# Patient Record
Sex: Female | Born: 1965 | Race: Black or African American | Hispanic: No | Marital: Single | Smoking: Never smoker
Health system: Southern US, Community
[De-identification: ages and names within clinical notes are randomized; demographics above are authoritative.]

## PROBLEM LIST (undated history)

## (undated) DIAGNOSIS — N83209 Unspecified ovarian cyst, unspecified side: Secondary | ICD-10-CM

## (undated) DIAGNOSIS — D259 Leiomyoma of uterus, unspecified: Secondary | ICD-10-CM

## (undated) DIAGNOSIS — I493 Ventricular premature depolarization: Secondary | ICD-10-CM

## (undated) DIAGNOSIS — R609 Edema, unspecified: Secondary | ICD-10-CM

## (undated) DIAGNOSIS — F419 Anxiety disorder, unspecified: Secondary | ICD-10-CM

## (undated) DIAGNOSIS — M2550 Pain in unspecified joint: Secondary | ICD-10-CM

## (undated) DIAGNOSIS — F418 Other specified anxiety disorders: Secondary | ICD-10-CM

## (undated) DIAGNOSIS — K635 Polyp of colon: Secondary | ICD-10-CM

## (undated) DIAGNOSIS — R011 Cardiac murmur, unspecified: Secondary | ICD-10-CM

## (undated) DIAGNOSIS — E039 Hypothyroidism, unspecified: Secondary | ICD-10-CM

## (undated) DIAGNOSIS — E559 Vitamin D deficiency, unspecified: Secondary | ICD-10-CM

## (undated) DIAGNOSIS — N6009 Solitary cyst of unspecified breast: Secondary | ICD-10-CM

## (undated) DIAGNOSIS — E049 Nontoxic goiter, unspecified: Secondary | ICD-10-CM

## (undated) DIAGNOSIS — E538 Deficiency of other specified B group vitamins: Secondary | ICD-10-CM

## (undated) DIAGNOSIS — Z8719 Personal history of other diseases of the digestive system: Secondary | ICD-10-CM

## (undated) DIAGNOSIS — R112 Nausea with vomiting, unspecified: Secondary | ICD-10-CM

## (undated) DIAGNOSIS — B009 Herpesviral infection, unspecified: Secondary | ICD-10-CM

## (undated) DIAGNOSIS — Z780 Asymptomatic menopausal state: Secondary | ICD-10-CM

## (undated) DIAGNOSIS — M1990 Unspecified osteoarthritis, unspecified site: Secondary | ICD-10-CM

## (undated) DIAGNOSIS — J189 Pneumonia, unspecified organism: Secondary | ICD-10-CM

## (undated) DIAGNOSIS — E785 Hyperlipidemia, unspecified: Secondary | ICD-10-CM

## (undated) DIAGNOSIS — Z9889 Other specified postprocedural states: Secondary | ICD-10-CM

## (undated) DIAGNOSIS — I491 Atrial premature depolarization: Secondary | ICD-10-CM

## (undated) HISTORY — DX: Deficiency of other specified B group vitamins: E53.8

## (undated) HISTORY — DX: Hyperlipidemia, unspecified: E78.5

## (undated) HISTORY — DX: Pain in unspecified joint: M25.50

## (undated) HISTORY — DX: Vitamin D deficiency, unspecified: E55.9

## (undated) HISTORY — DX: Asymptomatic menopausal state: Z78.0

## (undated) HISTORY — PX: COLONOSCOPY: SHX174

## (undated) HISTORY — DX: Cardiac murmur, unspecified: R01.1

## (undated) HISTORY — DX: Edema, unspecified: R60.9

## (undated) HISTORY — PX: COLON SURGERY: SHX602

## (undated) HISTORY — PX: POLYPECTOMY: SHX149

## (undated) HISTORY — DX: Unspecified ovarian cyst, unspecified side: N83.209

## (undated) HISTORY — DX: Solitary cyst of unspecified breast: N60.09

## (undated) HISTORY — DX: Nontoxic goiter, unspecified: E04.9

## (undated) HISTORY — DX: Leiomyoma of uterus, unspecified: D25.9

## (undated) HISTORY — DX: Anxiety disorder, unspecified: F41.9

## (undated) HISTORY — DX: Herpesviral infection, unspecified: B00.9

## (undated) HISTORY — DX: Unspecified osteoarthritis, unspecified site: M19.90

---

## 1993-03-26 HISTORY — PX: LIPOSUCTION: SHX10

## 1998-12-01 ENCOUNTER — Other Ambulatory Visit: Admission: RE | Admit: 1998-12-01 | Discharge: 1998-12-01 | Payer: Self-pay | Admitting: Obstetrics and Gynecology

## 2000-11-05 ENCOUNTER — Encounter: Admission: RE | Admit: 2000-11-05 | Discharge: 2000-12-31 | Payer: Self-pay | Admitting: Internal Medicine

## 2001-01-17 ENCOUNTER — Other Ambulatory Visit: Admission: RE | Admit: 2001-01-17 | Discharge: 2001-01-17 | Payer: Self-pay | Admitting: Obstetrics and Gynecology

## 2002-02-11 ENCOUNTER — Other Ambulatory Visit: Admission: RE | Admit: 2002-02-11 | Discharge: 2002-02-11 | Payer: Self-pay | Admitting: Obstetrics and Gynecology

## 2003-04-16 ENCOUNTER — Other Ambulatory Visit: Admission: RE | Admit: 2003-04-16 | Discharge: 2003-04-16 | Payer: Self-pay | Admitting: Obstetrics and Gynecology

## 2004-06-14 ENCOUNTER — Other Ambulatory Visit: Admission: RE | Admit: 2004-06-14 | Discharge: 2004-06-14 | Payer: Self-pay | Admitting: Obstetrics and Gynecology

## 2005-07-10 ENCOUNTER — Other Ambulatory Visit: Admission: RE | Admit: 2005-07-10 | Discharge: 2005-07-10 | Payer: Self-pay | Admitting: Obstetrics and Gynecology

## 2006-08-08 DIAGNOSIS — F411 Generalized anxiety disorder: Secondary | ICD-10-CM

## 2006-08-08 DIAGNOSIS — J309 Allergic rhinitis, unspecified: Secondary | ICD-10-CM | POA: Insufficient documentation

## 2006-08-09 ENCOUNTER — Ambulatory Visit: Payer: Self-pay | Admitting: Family Medicine

## 2006-08-09 LAB — CONVERTED CEMR LAB
ALT: 12 units/L (ref 0–40)
AST: 16 units/L (ref 0–37)
Alkaline Phosphatase: 70 units/L (ref 39–117)
BUN: 8 mg/dL (ref 6–23)
Basophils Relative: 0.1 % (ref 0.0–1.0)
Bilirubin, Direct: 0.1 mg/dL (ref 0.0–0.3)
Cholesterol: 176 mg/dL (ref 0–200)
Eosinophils Absolute: 0.2 10*3/uL (ref 0.0–0.6)
GFR calc non Af Amer: 65 mL/min
Glucose, Bld: 89 mg/dL (ref 70–99)
Lymphocytes Relative: 26.8 % (ref 12.0–46.0)
MCHC: 34.3 g/dL (ref 30.0–36.0)
Monocytes Absolute: 0.4 10*3/uL (ref 0.2–0.7)
Monocytes Relative: 4.1 % (ref 3.0–11.0)
Neutrophils Relative %: 66.5 % (ref 43.0–77.0)
Potassium: 4.1 meq/L (ref 3.5–5.1)
RDW: 11.7 % (ref 11.5–14.6)
TSH: 2.9 microintl units/mL (ref 0.35–5.50)
Total Bilirubin: 0.9 mg/dL (ref 0.3–1.2)

## 2006-08-30 ENCOUNTER — Encounter: Payer: Self-pay | Admitting: Family Medicine

## 2006-08-30 ENCOUNTER — Ambulatory Visit: Payer: Self-pay

## 2006-09-02 ENCOUNTER — Encounter (INDEPENDENT_AMBULATORY_CARE_PROVIDER_SITE_OTHER): Payer: Self-pay | Admitting: *Deleted

## 2007-03-27 HISTORY — DX: Morbid (severe) obesity due to excess calories: E66.01

## 2007-06-16 ENCOUNTER — Encounter: Payer: Self-pay | Admitting: Family Medicine

## 2007-06-17 ENCOUNTER — Ambulatory Visit: Payer: Self-pay | Admitting: Family Medicine

## 2007-06-17 DIAGNOSIS — R609 Edema, unspecified: Secondary | ICD-10-CM

## 2007-06-17 DIAGNOSIS — I10 Essential (primary) hypertension: Secondary | ICD-10-CM | POA: Insufficient documentation

## 2007-07-03 ENCOUNTER — Ambulatory Visit: Payer: Self-pay | Admitting: Family Medicine

## 2007-07-07 LAB — CONVERTED CEMR LAB
BUN: 11 mg/dL (ref 6–23)
Glucose, Bld: 79 mg/dL (ref 70–99)
Sodium: 138 meq/L (ref 135–145)

## 2007-09-17 ENCOUNTER — Ambulatory Visit: Payer: Self-pay | Admitting: Internal Medicine

## 2007-09-17 DIAGNOSIS — M25569 Pain in unspecified knee: Secondary | ICD-10-CM | POA: Insufficient documentation

## 2007-10-09 ENCOUNTER — Ambulatory Visit: Payer: Self-pay | Admitting: Family Medicine

## 2007-10-29 ENCOUNTER — Ambulatory Visit: Payer: Self-pay | Admitting: Family Medicine

## 2008-08-03 ENCOUNTER — Ambulatory Visit: Payer: Self-pay | Admitting: Family Medicine

## 2008-08-03 DIAGNOSIS — M129 Arthropathy, unspecified: Secondary | ICD-10-CM | POA: Insufficient documentation

## 2008-08-04 ENCOUNTER — Encounter: Payer: Self-pay | Admitting: Family Medicine

## 2008-08-04 ENCOUNTER — Encounter (INDEPENDENT_AMBULATORY_CARE_PROVIDER_SITE_OTHER): Payer: Self-pay | Admitting: *Deleted

## 2008-08-04 LAB — CONVERTED CEMR LAB
AST: 11 units/L (ref 0–37)
Albumin: 3.6 g/dL (ref 3.5–5.2)
BUN: 11 mg/dL (ref 6–23)
Basophils Absolute: 0.1 10*3/uL (ref 0.0–0.1)
CO2: 23 meq/L (ref 19–32)
Calcium: 8.5 mg/dL (ref 8.4–10.5)
Cholesterol: 143 mg/dL (ref 0–200)
Creatinine, Ser: 0.93 mg/dL (ref 0.40–1.20)
Eosinophils Relative: 4 % (ref 0–5)
HCT: 39.6 % (ref 36.0–46.0)
Indirect Bilirubin: 0.4 mg/dL (ref 0.0–0.9)
LDL Cholesterol: 79 mg/dL (ref 0–99)
Lymphs Abs: 2.7 10*3/uL (ref 0.7–4.0)
Monocytes Relative: 5 % (ref 3–12)
Platelets: 344 10*3/uL (ref 150–400)
RDW: 12.4 % (ref 11.5–15.5)
Sodium: 140 meq/L (ref 135–145)
Total Bilirubin: 0.5 mg/dL (ref 0.3–1.2)
Total CHOL/HDL Ratio: 3.2
VLDL: 19 mg/dL (ref 0–40)

## 2008-10-05 ENCOUNTER — Ambulatory Visit: Payer: Self-pay | Admitting: Family Medicine

## 2008-10-06 LAB — HM PAP SMEAR: HM Pap smear: NORMAL

## 2008-11-12 ENCOUNTER — Encounter: Payer: Self-pay | Admitting: Family Medicine

## 2008-11-22 ENCOUNTER — Ambulatory Visit (HOSPITAL_COMMUNITY): Admission: RE | Admit: 2008-11-22 | Discharge: 2008-11-22 | Payer: Self-pay | Admitting: General Surgery

## 2008-12-21 ENCOUNTER — Encounter: Payer: Self-pay | Admitting: Family Medicine

## 2008-12-21 ENCOUNTER — Encounter: Admission: RE | Admit: 2008-12-21 | Discharge: 2008-12-21 | Payer: Self-pay | Admitting: General Surgery

## 2008-12-21 ENCOUNTER — Ambulatory Visit: Payer: Self-pay | Admitting: Family Medicine

## 2008-12-21 DIAGNOSIS — L919 Hypertrophic disorder of the skin, unspecified: Secondary | ICD-10-CM

## 2008-12-21 DIAGNOSIS — L909 Atrophic disorder of skin, unspecified: Secondary | ICD-10-CM | POA: Insufficient documentation

## 2009-02-08 ENCOUNTER — Encounter: Payer: Self-pay | Admitting: Family Medicine

## 2009-02-09 ENCOUNTER — Telehealth: Payer: Self-pay | Admitting: Family Medicine

## 2009-04-14 ENCOUNTER — Ambulatory Visit: Payer: Self-pay | Admitting: Diagnostic Radiology

## 2009-04-14 ENCOUNTER — Ambulatory Visit: Payer: Self-pay | Admitting: Family Medicine

## 2009-04-14 ENCOUNTER — Ambulatory Visit (HOSPITAL_BASED_OUTPATIENT_CLINIC_OR_DEPARTMENT_OTHER): Admission: RE | Admit: 2009-04-14 | Discharge: 2009-04-14 | Payer: Self-pay | Admitting: Family Medicine

## 2009-04-14 DIAGNOSIS — M542 Cervicalgia: Secondary | ICD-10-CM

## 2009-04-15 ENCOUNTER — Telehealth (INDEPENDENT_AMBULATORY_CARE_PROVIDER_SITE_OTHER): Payer: Self-pay | Admitting: *Deleted

## 2009-05-05 ENCOUNTER — Encounter: Admission: RE | Admit: 2009-05-05 | Discharge: 2009-08-03 | Payer: Self-pay | Admitting: General Surgery

## 2009-05-23 ENCOUNTER — Ambulatory Visit (HOSPITAL_COMMUNITY): Admission: RE | Admit: 2009-05-23 | Discharge: 2009-05-24 | Payer: Self-pay | Admitting: General Surgery

## 2009-05-23 HISTORY — PX: LAPAROSCOPIC GASTRIC BANDING: SHX1100

## 2009-09-13 ENCOUNTER — Ambulatory Visit: Payer: Self-pay | Admitting: Family Medicine

## 2009-09-13 DIAGNOSIS — L259 Unspecified contact dermatitis, unspecified cause: Secondary | ICD-10-CM

## 2009-09-20 ENCOUNTER — Ambulatory Visit: Payer: Self-pay | Admitting: Family Medicine

## 2009-09-20 ENCOUNTER — Encounter: Admission: RE | Admit: 2009-09-20 | Discharge: 2009-09-20 | Payer: Self-pay | Admitting: General Surgery

## 2009-09-22 ENCOUNTER — Encounter (INDEPENDENT_AMBULATORY_CARE_PROVIDER_SITE_OTHER): Payer: Self-pay | Admitting: *Deleted

## 2009-10-06 ENCOUNTER — Encounter: Admission: RE | Admit: 2009-10-06 | Discharge: 2009-10-06 | Payer: Self-pay | Admitting: Obstetrics and Gynecology

## 2009-10-06 LAB — HM MAMMOGRAPHY: HM Mammogram: NORMAL

## 2009-10-10 ENCOUNTER — Encounter (INDEPENDENT_AMBULATORY_CARE_PROVIDER_SITE_OTHER): Payer: Self-pay | Admitting: *Deleted

## 2009-10-14 ENCOUNTER — Telehealth: Payer: Self-pay | Admitting: Family Medicine

## 2010-04-23 LAB — CONVERTED CEMR LAB
ALT: 10 units/L (ref 0–35)
AST: 16 units/L (ref 0–37)
BUN: 11 mg/dL (ref 6–23)
Basophils Relative: 0.7 % (ref 0.0–3.0)
Bilirubin, Direct: 0.1 mg/dL (ref 0.0–0.3)
CO2: 32 meq/L (ref 19–32)
Calcium: 9.1 mg/dL (ref 8.4–10.5)
Cholesterol: 172 mg/dL (ref 0–200)
Creatinine, Ser: 0.9 mg/dL (ref 0.4–1.2)
Glucose, Bld: 82 mg/dL (ref 70–99)
Hemoglobin: 12.6 g/dL (ref 12.0–15.0)
Lymphs Abs: 2.5 10*3/uL (ref 0.7–4.0)
MCHC: 34.4 g/dL (ref 30.0–36.0)
MCV: 91.9 fL (ref 78.0–100.0)
Monocytes Absolute: 0.4 10*3/uL (ref 0.1–1.0)
Neutro Abs: 4 10*3/uL (ref 1.4–7.7)
Protein, U semiquant: 30
Specific Gravity, Urine: >=1.03
TSH: 2.31 microintl units/mL (ref 0.35–5.50)
Total Bilirubin: 0.7 mg/dL (ref 0.3–1.2)
Total Protein: 7.3 g/dL (ref 6.0–8.3)
Urobilinogen, UA: NEGATIVE
VLDL: 18.4 mg/dL (ref 0.0–40.0)
WBC: 7.2 10*3/uL (ref 4.5–10.5)
pH: 5

## 2010-04-25 NOTE — Progress Notes (Signed)
Summary: RX  Phone Note Call from Patient Call back at Home Phone [phone]   Caller: Patient Reason for Call: Refill Medication Summary of Call: PT WOULD LIKE FOR [place] TO CALL IN HER HCTZ 25 MG TO THE [facility] ON [address] Initial call taken by: Freddy Jaksch,  October 14, 2009 3:13 PM  Follow-up for Phone Call        she was given 100 with 1 refill in may Follow-up by: Loreen Freud DO,  October 14, 2009 4:43 PM  Additional Follow-up for Phone Call Additional follow up Details #1::        left pt detail message pharmacy has rx refills on file need to contact pharmacy for refills and to return call to office with any further concerns..........Marland KitchenFelecia Deloach CMA  October 14, 2009 5:12 PM

## 2010-04-25 NOTE — Assessment & Plan Note (Signed)
Summary: cpx//cbs//rsh//lch/rsh/cbs   Vital Signs:  Patient profile:   45 year old female Menstrual status:  regular LMP:     08/30/2009 Height:      64.5 inches Weight:      222 pounds BMI:     37.65 Temp:     97.6 degrees F oral Pulse rate:   82 / minute Pulse rhythm:   regular Resp:     12 per minute BP sitting:   130 / 82  (left arm) Cuff size:   large  Vitals Entered By: Army Fossa CMA (September 13, 2009 9:08 AM) CC: Pt here for CPX, no pap.Pt is fasting LMP (date): 08/30/2009     Menstrual Status regular Enter LMP: 08/30/2009 Last PAP Result normal   History of Present Illness: Pt here for cpe and labs.   Pt sees Dr Marcelle Overlie for pap etc.   Pt with no complaints.      Hypertension follow-up      This is a 45 year old woman who presents for Hypertension follow-up.  The patient denies lightheadedness, urinary frequency, headaches, edema, impotence, rash, and fatigue.  The patient denies the following associated symptoms: chest pain, chest pressure, exercise intolerance, dyspnea, palpitations, syncope, leg edema, and pedal edema.  Compliance with medications (by patient report) has been near 100%.  The patient reports that dietary compliance has been good.  The patient reports exercising daily.  Adjunctive measures currently used by the patient include salt restriction.    Preventive Screening-Counseling & Management  Alcohol-Tobacco     Alcohol drinks/day: <1     Alcohol type: all     Alcohol Counseling: not indicated; use of alcohol is not excessive or problematic     Smoking Status: never  Caffeine-Diet-Exercise     Caffeine use/day: 0     Diet Comments: healthy diet     Does Patient Exercise: yes     Type of exercise: walking     Exercise (avg: min/session): 30-60     Times/week: 7     Exercise Counseling: not indicated; exercise is adequate  Hep-HIV-STD-Contraception     Hepatitis Risk: no risk noted     HIV Risk: no risk noted     STD Risk: no risk  noted     STD Risk Counseling: not indicated-no STD risk noted     Dental Visit-last 6 months yes     SBE monthly: yes  Safety-Violence-Falls     Seat Belt Use: yes     Firearms in the Home: no firearms in the home     Smoke Detectors: yes     Violence in the Home: no risk noted     Sexual Abuse: no      Sexual History:  single.        Drug Use:  no.    Current Medications (verified): 1)  Hydrochlorothiazide 25 Mg  Tabs (Hydrochlorothiazide) .Marland Kitchen.. 1 By Mouth Once Daily 2)  Alprazolam 0.25 Mg Tabs (Alprazolam) .Marland Kitchen.. 1 By Mouth Three Times A Day As Needed 3)  Flexeril 10 Mg Tabs (Cyclobenzaprine Hcl) .Marland Kitchen.. 1 By Mouth Three Times A Day As Needed 4)  Ultram 50 Mg Tabs (Tramadol Hcl) .Marland Kitchen.. 1-2 By Mouth Every 6 Hours As Needed  Allergies (verified): No Known Drug Allergies  Past History:  Past Medical History: Last updated: 06/17/2007 Allergic rhinitis Hypertension  Family History: Last updated: 08/03/2008 Family History Hypertension M-- brain aneurysm  Social History: Last updated: 09/13/2009 Occupation:  Ambulance person lab Single Never Smoked  Alcohol use-no Drug use-no Regular exercise-yes  Risk Factors: Alcohol Use: <1 (09/13/2009) Caffeine Use: 0 (09/13/2009) Diet: healthy diet (09/13/2009) Exercise: yes (09/13/2009)  Risk Factors: Smoking Status: never (09/13/2009)  Past Surgical History: Lap band --05/23/2009 Dr Odie Sera  Family History: Reviewed history from 08/03/2008 and no changes required. Family History Hypertension M-- brain aneurysm  Social History: Reviewed history from 09/17/2007 and no changes required. Occupation:  Armed forces logistics/support/administrative officer Never Smoked Alcohol use-no Drug use-no Regular exercise-yes Caffeine use/day:  0 Sexual History:  single Occupation:  employed Drug Use:  no  Review of Systems      See HPI General:  Denies chills, fatigue, fever, loss of appetite, malaise, sleep disorder, sweats, weakness, and weight loss. Eyes:  Denies  blurring, discharge, double vision, eye irritation, eye pain, halos, itching, light sensitivity, red eye, vision loss-1 eye, and vision loss-both eyes; optho--q2y. ENT:  Denies decreased hearing, difficulty swallowing, ear discharge, earache, hoarseness, nasal congestion, nosebleeds, postnasal drainage, ringing in ears, sinus pressure, and sore throat. CV:  Denies bluish discoloration of lips or nails, chest pain or discomfort, difficulty breathing at night, difficulty breathing while lying down, fainting, fatigue, leg cramps with exertion, lightheadness, near fainting, palpitations, shortness of breath with exertion, swelling of feet, swelling of hands, and weight gain. Resp:  Denies chest discomfort, chest pain with inspiration, cough, coughing up blood, excessive snoring, hypersomnolence, morning headaches, pleuritic, shortness of breath, sputum productive, and wheezing. GI:  Denies abdominal pain, bloody stools, change in bowel habits, constipation, dark tarry stools, diarrhea, excessive appetite, gas, hemorrhoids, indigestion, loss of appetite, nausea, and vomiting. GU:  Denies abnormal vaginal bleeding, decreased libido, discharge, dysuria, genital sores, hematuria, incontinence, nocturia, urinary frequency, and urinary hesitancy. MS:  Denies joint pain, joint redness, joint swelling, loss of strength, low back pain, mid back pain, muscle aches, muscle , cramps, muscle weakness, stiffness, and thoracic pain. Derm:  Denies changes in color of skin, changes in nail beds, dryness, excessive perspiration, flushing, hair loss, insect bite(s), itching, lesion(s), poor wound healing, and rash. Neuro:  Denies brief paralysis, difficulty with concentration, disturbances in coordination, falling down, headaches, inability to speak, memory loss, numbness, poor balance, seizures, sensation of room spinning, tingling, tremors, visual disturbances, and weakness. Psych:  Denies alternate hallucination (  auditory/visual), anxiety, depression, easily angered, easily tearful, irritability, mental problems, panic attacks, sense of great danger, suicidal thoughts/plans, thoughts of violence, unusual visions or sounds, and thoughts /plans of harming others. Endo:  Denies cold intolerance, excessive hunger, excessive thirst, excessive urination, heat intolerance, polyuria, and weight change. Heme:  Denies abnormal bruising, bleeding, enlarge lymph nodes, fevers, pallor, and skin discoloration. Allergy:  Denies hives or rash, itching eyes, persistent infections, seasonal allergies, and sneezing.  Physical Exam  General:  Well-developed,well-nourished,in no acute distress; alert,appropriate and cooperative throughout examination Head:  Normocephalic and atraumatic without obvious abnormalities. No apparent alopecia or balding. Eyes:  pupils equal, pupils round, pupils reactive to light, and no injection.   Ears:  External ear exam shows no significant lesions or deformities.  Otoscopic examination reveals clear canals, tympanic membranes are intact bilaterally without bulging, retraction, inflammation or discharge. Hearing is grossly normal bilaterally. Nose:  External nasal examination shows no deformity or inflammation. Nasal mucosa are pink and moist without lesions or exudates. Mouth:  Oral mucosa and oropharynx without lesions or exudates.  Teeth in good repair. Neck:  No deformities, masses, or tenderness noted. Breasts:  gyn Lungs:  Normal respiratory effort, chest expands symmetrically. Lungs are clear to auscultation,  no crackles or wheezes. Heart:  normal rate and no murmur.   Abdomen:  Bowel sounds positive,abdomen soft and non-tender without masses, organomegaly or hernias noted. Rectal:  gyn Genitalia:  gyn Msk:  normal ROM, no joint tenderness, no joint swelling, no joint warmth, no redness over joints, no joint deformities, no joint instability, and no crepitation.   Pulses:  R posterior  tibial normal, R dorsalis pedis normal, R carotid normal, L posterior tibial normal, L dorsalis pedis normal, and L carotid normal.   Extremities:  No clubbing, cyanosis, edema, or deformity noted with normal full range of motion of all joints.   Neurologic:  alert & oriented X3 and gait normal.   Skin:  Intact without suspicious lesions. Pt c/o rash behind L knee that is improving on its own---papular rash Cervical Nodes:  No lymphadenopathy noted Psych:  Oriented X3, normally interactive, good eye contact, not anxious appearing, and not depressed appearing.     Impression & Recommendations:  Problem # 1:  PREVENTIVE HEALTH CARE (ICD-V70.0)  check fasting labs ghm utd   Orders: UA Dipstick w/o Micro (manual) (09811)  Problem # 2:  HYPERTENSION (ICD-401.9)  Her updated medication list for this problem includes:    Hydrochlorothiazide 25 Mg Tabs (Hydrochlorothiazide) .Marland Kitchen... 1 by mouth once daily  BP today: 130/82 Prior BP: 142/80 (04/14/2009)  Labs Reviewed: K+: 4.3 (08/03/2008) Creat: : 0.93 (08/03/2008)   Chol: 143 (08/03/2008)   HDL: 45 (08/03/2008)   LDL: 79 (08/03/2008)   TG: 93 (08/03/2008)  Orders: EKG w/ Interpretation (93000)  Problem # 3:  MORBID OBESITY (ICD-278.01)  s/p lab band con't diet and exercise  Ht: 64.5 (09/13/2009)   Wt: 222 (09/13/2009)   BMI: 37.65 (09/13/2009)  Orders: EKG w/ Interpretation (93000)  Problem # 4:  CONTACT DERMATITIS (ICD-692.9) Assessment: Improved  try otc cortisone cream  call or rto as needed   Discussed avoidance of triggers and symptomatic treatment.   Orders: EKG w/ Interpretation (93000)  Complete Medication List: 1)  Hydrochlorothiazide 25 Mg Tabs (Hydrochlorothiazide) .Marland Kitchen.. 1 by mouth once daily 2)  Alprazolam 0.25 Mg Tabs (Alprazolam) .Marland Kitchen.. 1 by mouth three times a day as needed 3)  Flexeril 10 Mg Tabs (Cyclobenzaprine hcl) .Marland Kitchen.. 1 by mouth three times a day as needed 4)  Ultram 50 Mg Tabs (Tramadol hcl) .Marland Kitchen.. 1-2  by mouth every 6 hours as needed  Other Orders: Venipuncture (91478) TLB-Lipid Panel (80061-LIPID) TLB-BMP (Basic Metabolic Panel-BMET) (80048-METABOL) TLB-CBC Platelet - w/Differential (85025-CBCD) TLB-Hepatic/Liver Function Pnl (80076-HEPATIC) TLB-TSH (Thyroid Stimulating Hormone) (84443-TSH) TB Skin Test (29562) Admin 1st Vaccine (13086) Varicella  [phone]) Admin of Any Addtl Vaccine (96295)   EKG  Procedure date:  09/13/2009  Findings:      Normal sinus rhythm with rate of:  73 bpm    Immunizations Administered:  PPD Skin Test:    Vaccine Type: PPD    Site: left forearm    Mfr: Sanofi Pasteur    Dose: 0.1 ml    Route: ID    Given by: Army Fossa CMA    Exp. Date: 01/06/2011    Lot #: [id number]  Varicella Vaccine # 2:    Vaccine Type: Varicella    Site: left deltoid    Mfr: Merck    Dose: 0.5 ml    Route: IM    Given by: Army Fossa CMA    Exp. Date: 10/14/2010    Lot #: [id number]   Last Flu Vaccine:  Fluvax 3+ (01/07/2008 1:36:58 PM) Flu  Vaccine Result Date:  01/05/2009 Flu Vaccine Result:  given Flu Vaccine Next Due:  1 yr PAP Result Date:  10/06/2008 PAP Result:  normal PAP Next Due:  1 yr Mammogram Result Date:  10/06/2008 Mammogram Result:  normal Mammogram Next Due:  1 yr      Immunizations Administered:  PPD Skin Test:    Vaccine Type: PPD    Site: left forearm    Mfr: Sanofi Pasteur    Dose: 0.1 ml    Route: ID    Given by: Army Fossa CMA    Exp. Date: 01/06/2011    Lot #: [id number]  Varicella Vaccine # 2:    Vaccine Type: Varicella    Site: left deltoid    Mfr: Merck    Dose: 0.5 ml    Route: IM    Given by: Army Fossa CMA    Exp. Date: 10/14/2010    Lot #: [id number]  Laboratory Results   Urine Tests   Date/Time Reported: September 13, 2009 10:14 AM   Routine Urinalysis   Color: orange Appearance: Clear Glucose: negative   (Normal Range: Negative) Bilirubin: negative   (Normal Range: Negative) Ketone: negative    (Normal Range: Negative) Spec. Gravity: >=1.030   (Normal Range: 1.003-1.035) Blood: negative   (Normal Range: Negative) pH: 5.0   (Normal Range: 5.0-8.0) Protein: 30   (Normal Range: Negative) Urobilinogen: negative   (Normal Range: 0-1) Nitrite: negative   (Normal Range: Negative) Leukocyte Esterace: negative   (Normal Range: Negative)    Comments: Floydene Flock  September 13, 2009 10:14 AM      Appended Document: cpx//cbs//rsh//lch/rsh/cbs    Clinical Lists Changes  Observations: Added new observation of TB PPDRESULT: negative (09/15/2009 15:47) Added new observation of PPD RESULT: < 5mm (09/15/2009 15:47) Added new observation of TB-PPD RDDTE: 09/15/2009 (09/15/2009 15:47)       PPD Results    Date of reading: 09/15/2009    Results: < 5mm    Interpretation: negative

## 2010-04-25 NOTE — Assessment & Plan Note (Signed)
Summary: neck & upper back pain - jr   Vital Signs:  Patient profile:   45 year old female Weight:      264 pounds Temp:     98.2 degrees F oral Pulse rate:   86 / minute Pulse rhythm:   regular BP sitting:   142 / 80  (left arm) Cuff size:   large  Vitals Entered By: Army Fossa CMA (April 14, 2009 11:00 AM) CC: Neck and upper back pain since monday. unsure what happened.    History of Present Illness: Pt here c/o neck and upper back pain since monday.  By Tuesday it was worse.  No known injury.   Pt states she did nothing out of the ordinary.  This occurred a few months ago as well and it went away on its own.  Pt taking Ibuprofen which takes the edge off but it comes right back.   Also using heat patches with little relief.      Current Medications (verified): 1)  Hydrochlorothiazide 25 Mg  Tabs (Hydrochlorothiazide) .Marland Kitchen.. 1 By Mouth Once Daily 2)  Mobic 15 Mg Tabs (Meloxicam) .... 1/2 -1 By Mouth Once Daily As Needed 3)  Alprazolam 0.25 Mg Tabs (Alprazolam) .Marland Kitchen.. 1 By Mouth Three Times A Day As Needed 4)  Flexeril 10 Mg Tabs (Cyclobenzaprine Hcl) .Marland Kitchen.. 1 By Mouth Three Times A Day As Needed 5)  Ultram 50 Mg Tabs (Tramadol Hcl) .Marland Kitchen.. 1-2 By Mouth Every 6 Hours As Needed  Allergies (verified): No Known Drug Allergies  Past History:  Past medical, surgical, family and social histories (including risk factors) reviewed for relevance to current acute and chronic problems.  Past Medical History: Reviewed history from 06/17/2007 and no changes required. Allergic rhinitis Hypertension  Family History: Reviewed history from 08/03/2008 and no changes required. Family History Hypertension M-- brain aneurysm  Social History: Reviewed history from 09/17/2007 and no changes required. nondrinker nonsmoker rare exercise works with [employer]  Review of Systems      See HPI  Physical Exam  General:  Well-developed,well-nourished,in no acute distress; alert,appropriate and  cooperative throughout examination Msk:  + muscle spasm traps b/L  with decrease ROM Neurologic:  alert & oriented X3 and gait normal.   Skin:  Intact without suspicious lesions or rashes Cervical Nodes:  No lymphadenopathy noted Psych:  Oriented X3 and normally interactive.     Impression & Recommendations:  Problem # 1:  NECK AND BACK PAIN (ICD-723.1)  Her updated medication list for this problem includes:    Mobic 15 Mg Tabs (Meloxicam) .Marland Kitchen... 1/2 -1 by mouth once daily as needed    Flexeril 10 Mg Tabs (Cyclobenzaprine hcl) .Marland Kitchen... 1 by mouth three times a day as needed    Ultram 50 Mg Tabs (Tramadol hcl) .Marland Kitchen... 1-2 by mouth every 6 hours as needed  Orders: T-Cervicle Spine 2-3 Views (16109UE) T-Thoracic Spine 2 Views [phone])  Discussed exercises and use of moist heat or cold and medication.   Complete Medication List: 1)  Hydrochlorothiazide 25 Mg Tabs (Hydrochlorothiazide) .Marland Kitchen.. 1 by mouth once daily 2)  Mobic 15 Mg Tabs (Meloxicam) .... 1/2 -1 by mouth once daily as needed 3)  Alprazolam 0.25 Mg Tabs (Alprazolam) .Marland Kitchen.. 1 by mouth three times a day as needed 4)  Flexeril 10 Mg Tabs (Cyclobenzaprine hcl) .Marland Kitchen.. 1 by mouth three times a day as needed 5)  Ultram 50 Mg Tabs (Tramadol hcl) .Marland Kitchen.. 1-2 by mouth every 6 hours as needed Prescriptions: ULTRAM 50 MG TABS (  TRAMADOL HCL) 1-2 by mouth every 6 hours as needed  #30 x 0   Entered and Authorized by:   Loreen Freud DO   Signed by:   Loreen Freud DO on 04/14/2009   Method used:   Electronically to        [facility] [phone]* (retail)       [address]       [place], [place]  [address]       Ph: [phone]       Fax: [phone]   RxID:   [id number] FLEXERIL 10 MG TABS (CYCLOBENZAPRINE HCL) 1 by mouth three times a day as needed  #30 x 0   Entered and Authorized by:   Loreen Freud DO   Signed by:   Loreen Freud DO on 04/14/2009   Method used:   Electronically to        [facility] [phone]* (retail)       [address]       [place], [place]  [address]       Ph: [phone]       Fax: [phone]   RxID:   [id number]

## 2010-04-25 NOTE — Miscellaneous (Signed)
Summary: ppd read  Clinical Lists Changes  Observations: Added new observation of TB PPDRESULT: negative (09/22/2009 15:55) Added new observation of PPD RESULT: < 5mm (09/22/2009 15:55) Added new observation of TB-PPD RDDTE: 09/22/2009 (09/22/2009 15:55)      PPD Results    Date of reading: 09/22/2009    Results: < 5mm    Interpretation: negative

## 2010-04-25 NOTE — Assessment & Plan Note (Signed)
Summary: TB //CBS  Nurse Visit   Allergies: No Known Drug Allergies  Immunizations Administered:  PPD Skin Test:    Vaccine Type: PPD    Site: left forearm    Mfr: Sanofi Pasteur    Dose: 0.1 ml    Route: ID    Given by: Doristine Devoid    Exp. Date: 01/22/2011    Lot #: [id number]  Orders Added: 1)  TB Skin Test [86580] 2)  Admin 1st Vaccine [90471]   Orders Added: 1)  TB Skin Test [86580] 2)  Admin 1st Vaccine [04540]

## 2010-04-25 NOTE — Letter (Signed)
Summary: Generic Letter  [facility]  [address] [place], [place] [address]   Phone: [phone]  Fax: [phone]    10/10/2009 Kelly [name] Date of birth: 24-Dec-1965 MRN: [id number]  To whom it may concern: We currently do not have the flu vaccine at our facility.  Therefore [name] is not able to receive the flu vaccine until October 2011.         Sincerely,   Madelyn Flavors at [facility]

## 2010-04-25 NOTE — Progress Notes (Signed)
Summary: Imaging Results   Phone Note Outgoing Call   Call placed by: Army Fossa CMA,  April 15, 2009 9:22 AM Summary of Call: Imaging Results, LMTCB:  Cervical/Thoracic Spine: Normal   Follow-up for Phone Call        Pt is aware. Army Fossa CMA  April 15, 2009 2:23 PM

## 2010-06-15 LAB — DIFFERENTIAL
Lymphocytes Relative: 28 % (ref 12–46)
Lymphs Abs: 2.2 10*3/uL (ref 0.7–4.0)
Monocytes Relative: 6 % (ref 3–12)
Neutrophils Relative %: 62 % (ref 43–77)

## 2010-06-15 LAB — COMPREHENSIVE METABOLIC PANEL
ALT: 17 U/L (ref 0–35)
AST: 23 U/L (ref 0–37)
Albumin: 3.5 g/dL (ref 3.5–5.2)
Calcium: 9.4 mg/dL (ref 8.4–10.5)
GFR calc non Af Amer: >60 mL/min (ref >60)
Total Bilirubin: 0.8 mg/dL (ref 0.3–1.2)
Total Protein: 8.4 g/dL — ABNORMAL HIGH (ref 6.0–8.3)

## 2010-06-15 LAB — CBC
MCV: 90.3 fL (ref 78.0–100.0)
RDW: 12.8 % (ref 11.5–15.5)

## 2010-06-19 LAB — CBC
HCT: 37.2 % (ref 36.0–46.0)
MCV: 91.2 fL (ref 78.0–100.0)
RBC: 4.08 MIL/uL (ref 3.87–5.11)
RDW: 12.4 % (ref 11.5–15.5)

## 2010-06-19 LAB — DIFFERENTIAL
Basophils Absolute: 0 10*3/uL (ref 0.0–0.1)
Lymphocytes Relative: 11 % — ABNORMAL LOW (ref 12–46)
Neutro Abs: 10.5 10*3/uL — ABNORMAL HIGH (ref 1.7–7.7)
Neutrophils Relative %: 84 % — ABNORMAL HIGH (ref 43–77)

## 2010-07-19 ENCOUNTER — Encounter: Payer: Self-pay | Admitting: Family Medicine

## 2010-07-24 ENCOUNTER — Encounter: Payer: Self-pay | Admitting: Family Medicine

## 2010-07-24 ENCOUNTER — Ambulatory Visit (INDEPENDENT_AMBULATORY_CARE_PROVIDER_SITE_OTHER): Payer: 59 | Admitting: Family Medicine

## 2010-07-24 VITALS — BP 110/68 | HR 76 | Wt 179.8 lb

## 2010-07-24 DIAGNOSIS — F418 Other specified anxiety disorders: Secondary | ICD-10-CM

## 2010-07-24 DIAGNOSIS — F341 Dysthymic disorder: Secondary | ICD-10-CM

## 2010-07-24 MED ORDER — FLUOXETINE HCL 10 MG PO CAPS: 10.0000 mg | ORAL_CAPSULE | Freq: Every day | ORAL | Status: DC

## 2010-07-24 MED ORDER — ALPRAZOLAM 0.25 MG PO TABS: ORAL_TABLET | ORAL | Status: DC

## 2010-07-24 NOTE — Progress Notes (Signed)
  Subjective:   Kelly [name] is an 45 y.o. female who presents for evaluation and treatment of depressive symptoms.  Onset approximately 2 months ago, gradually worsening since that time. Pt had a bad break up and is most likely moving. Current symptoms include difficulty concentrating, anxiety and she is not tired but does not feel rested.  Current treatment for depression:Medication Sleep problems: Absent   Early awakening:Absent   Energy: Poor Motivation: Fair Concentration: Fair Rumination/worrying: Marked Memory: Good Tearfulness: Absent  Anxiety: Marked  Panic: Absent  Overall Mood: Minimally worse  Hopelessness: Absent Suicidal ideation: Absent  Other/Psychosocial Stressors: school, work , home Family history positive for depression in the patient's unknown.  Previous treatment modalities employed include Individual therapy and Medication.  Past episodes of depression:few years ago Organic causes of depression present: None.  Review of Systems Pertinent items are noted in HPI.   Objective:   Mental Status Examination: Posture and motor behavior: Appropriate Dress, grooming, personal hygiene: Appropriate Facial expression: Appropriate Speech: Appropriate Mood: Appropriate Coherency and relevance of thought: Appropriate Thought content: Appropriate Perceptions: Appropriate Orientation:Appropriate Attention and concentration: Appropriate Memory: : Appropriate Information: Not examined Vocabulary: Appropriate Abstract reasoning: Appropriate Judgment: Appropriate    Assessment:   Experiencing the following symptoms of depression most of the day nearly every day for more than two consecutive weeks: depressed mood, loss of interests/pleasure, loss of energy  Depressive Disorder:with anxiety  Suicide Risk Assessment:  Suicidal intent: no Suicidal plan: no Access to means for suicide: no Lethality of means for suicide: no Prior suicide attempts: no Recent  exposure to suicide:no   Plan:    Refill anxiety med.  Pt may be moving.  Reviewed concept of depression as biochemical imbalance of neurotransmitters and rationale for treatment. Instructed patient to contact office or on-call physician promptly should condition worsen or any new symptoms appear and provided on-call telephone numbers.

## 2010-07-24 NOTE — Patient Instructions (Signed)
Anxiety and Panic Attacks Your caregiver has informed you that you are having an anxiety or panic attack. There may be many forms of this. Most of the time these attacks come suddenly and without warning. They come at any time of day, including periods of sleep, and at any time of life. They may be strong and unexplained. Although panic attacks are very scary, they are physically harmless. Sometimes the cause of your anxiety is not known. Anxiety is a protective mechanism of the body in its fight or flight mechanism. Most of these perceived danger situations are actually nonphysical situations (such as anxiety over losing a job). CAUSES The causes of an anxiety or panic attack are many. Panic attacks may occur in otherwise healthy people given a certain set of circumstances. There may be a genetic cause for panic attacks. Some medications may also have anxiety as a side effect. SYMPTOMS Some of the most common feelings are:  Intense terror.  Dizziness, feeling faint.   Hot and cold flashes.   Fear of going crazy.   Feelings that nothing is real.   Sweating.   Shaking.   Chest pain or a fast heartbeat (palpitations).  Smothering, choking sensations.   Feelings of impending doom and that death is near.   Tingling of extremities, this may be from over breathing.   Altered reality (derealization).   Being detached from yourself (depersonalization).   Several symptoms can be present to make up anxiety or panic attacks. DIAGNOSIS The evaluation by your caregiver will depend on the type of symptoms you are experiencing. The diagnosis of anxiety or pain attack is made when no physical illness can be determined to be a cause of the symptoms. TREATMENT Treatment to prevent anxiety and panic attacks may include:  Avoidance of circumstances that cause anxiety.   Reassurance and relaxation.   Regular exercise.   Relaxation therapies, such as yoga.   Psychotherapy with a psychiatrist  or therapist.   Avoidance of caffeine, alcohol and illegal drugs.   Prescribed medication.  SEEK IMMEDIATE MEDICAL CARE IF:  You experience panic attack symptoms that are different than your usual symptoms.   You have any worsening or concerning symptoms.  Document Released: 03/12/2005 Document Re-Released: 08/30/2009 [employer] Patient Information 2011 [employer]. 

## 2010-08-22 ENCOUNTER — Encounter: Payer: Self-pay | Admitting: Family Medicine

## 2010-08-22 ENCOUNTER — Ambulatory Visit (INDEPENDENT_AMBULATORY_CARE_PROVIDER_SITE_OTHER): Payer: 59 | Admitting: Family Medicine

## 2010-08-22 VITALS — BP 120/78 | HR 74 | Temp 98.7°F | Wt 176.0 lb

## 2010-08-22 DIAGNOSIS — F341 Dysthymic disorder: Secondary | ICD-10-CM

## 2010-08-22 DIAGNOSIS — F418 Other specified anxiety disorders: Secondary | ICD-10-CM

## 2010-08-22 MED ORDER — FLUOXETINE HCL 20 MG PO CAPS: 20.0000 mg | ORAL_CAPSULE | Freq: Every day | ORAL | Status: DC

## 2010-08-22 NOTE — Patient Instructions (Signed)
Depression You have signs of depression. This is a common problem. It can occur at any age. It is often hard to recognize. People can suffer from depression and still have moments of enjoyment. Depression interferes with your basic ability to function in life. It upsets your relationships, sleep, eating, and work habits. CAUSES Depression is believed to be caused by an imbalance in brain chemicals. It may be triggered by an unpleasant event. Relationship crises, a death in the family, financial worries, retirement, or other stressors are normal causes of depression. Depression may also start for no known reason. Other factors that may play a part include medical illnesses, some medicines, genetics, and alcohol or drug abuse. SYMPTOMS  Feeling unhappy or worthless.   Long-lasting (chronic) tiredness or worn-out feeling.   Self-destructive thoughts and actions.   Not being able to sleep or sleeping too much.   Eating more than usual or not eating at all.   Headaches or feeling anxious.   Trouble concentrating or making decisions.   Unexplained physical problems and substance abuse.  TREATMENT Depression usually gets better with treatment. This can include:  Antidepressant medicines. It can take weeks before the proper dose is achieved and benefits are reached.   Talking with a therapist, clergyperson, counselor, or friend. These people can help you gain insight into your problem and regain control of your life.   Eating a good diet.   Getting regular physical exercise, such as walking for 30 minutes every day.   Not abusing alcohol or drugs.  Treating depression often takes 6 months or longer. This length of treatment is needed to keep symptoms from returning. Call your caregiver and arrange for follow-up care as suggested. SEEK IMMEDIATE MEDICAL CARE IF:  You start to have thoughts of hurting yourself or others.   Call your local emergency services (911 in [place]   Go to your  local medical emergency department.   Call the National Suicide Prevention Lifeline: 1-800-273-TALK (1-800-273-8255).  Document Released: 03/12/2005 Document Re-Released: 08/30/2009 Exit[employer] Patient Information 2011 [employer]. 

## 2010-08-22 NOTE — Progress Notes (Signed)
  Subjective:   Kelly [name] is an 45 y.o. female who presents for evaluation and treatment of depressive symptoms.  Onset approximately 2 years ago, gradually worsening since that time.  Current symptoms include depressed mood, insomnia, fatigue, difficulty concentrating, anxiety, loss of energy/fatigue and disturbed sleep.  Current treatment for depression:Medication and thinking about counseling Sleep problems: Mild   Early awakening:Mild   Energy: Poor Motivation: Poor Concentration: Limited Rumination/worrying: Mild Memory: Good Tearfulness: Severe  Anxiety: Moderate  Panic: Absent  Overall Mood: No change  Hopelessness: Absent Suicidal ideation: Absent  Other/Psychosocial Stressors: home situation Family history positive for depression in the patient's unknown.  Previous treatment modalities employed include None.  Past episodes of depression:none Organic causes of depression present: None.  Review of Systems Pertinent items are noted in HPI.   Objective:   Mental Status Examination: Posture and motor behavior: Appropriate Dress, grooming, personal hygiene: Appropriate Facial expression: Appropriate Speech: Appropriate Mood: Appropriate Coherency and relevance of thought: Appropriate Thought content: Appropriate Perceptions: Appropriate Orientation:Appropriate Attention and concentration: Appropriate Memory: : Appropriate Information: Not examined Vocabulary: Appropriate Abstract reasoning: Appropriate Judgment: Appropriate    Assessment:   Experiencing the following symptoms of depression most of the day nearly every day for more than two consecutive weeks: depressed mood  Depressive Disorder:with anxiety  Suicide Risk Assessment:  Suicidal intent: no Suicidal plan: no Access to means for suicide: no Lethality of means for suicide: no Prior suicide attempts: no Recent exposure to suicide:no   Plan:    1. Depression with anxiety  FLUoxetine (PROZAC)  20 MG capsule    Reviewed concept of depression as biochemical imbalance of neurotransmitters and rationale for treatment. Instructed patient to contact office or on-call physician promptly should condition worsen or any new symptoms appear and provided on-call telephone numbers.

## 2010-09-21 ENCOUNTER — Ambulatory Visit (INDEPENDENT_AMBULATORY_CARE_PROVIDER_SITE_OTHER): Payer: 59 | Admitting: Family Medicine

## 2010-09-21 ENCOUNTER — Encounter: Payer: Self-pay | Admitting: Family Medicine

## 2010-09-21 VITALS — BP 132/76 | HR 77 | Temp 99.0°F | Ht 64.25 in | Wt 170.2 lb

## 2010-09-21 DIAGNOSIS — F329 Major depressive disorder, single episode, unspecified: Secondary | ICD-10-CM

## 2010-09-21 DIAGNOSIS — Z0000 Encounter for general adult medical examination without abnormal findings: Secondary | ICD-10-CM

## 2010-09-21 DIAGNOSIS — R609 Edema, unspecified: Secondary | ICD-10-CM

## 2010-09-21 LAB — CBC WITH DIFFERENTIAL/PLATELET
Basophils Relative: 0.5 % (ref 0.0–3.0)
Hemoglobin: 12.9 g/dL (ref 12.0–15.0)
Lymphocytes Relative: 39.7 % (ref 12.0–46.0)
Lymphs Abs: 3.1 10*3/uL (ref 0.7–4.0)
Neutro Abs: 3.7 10*3/uL (ref 1.4–7.7)
Neutrophils Relative %: 48 % (ref 43.0–77.0)
Platelets: 284 10*3/uL (ref 150.0–400.0)
RDW: 12.7 % (ref 11.5–14.6)
WBC: 7.7 10*3/uL (ref 4.5–10.5)

## 2010-09-21 LAB — POCT URINALYSIS DIPSTICK
Bilirubin, UA: NEGATIVE
Glucose, UA: NEGATIVE
Ketones, UA: NEGATIVE
Leukocytes, UA: NEGATIVE

## 2010-09-21 LAB — BASIC METABOLIC PANEL
BUN: 10 mg/dL (ref 6–23)
Creatinine, Ser: 0.9 mg/dL (ref 0.4–1.2)
GFR: 84.55 mL/min (ref >60.00)

## 2010-09-21 LAB — HEPATIC FUNCTION PANEL
ALT: 15 U/L (ref 0–35)
Albumin: 4 g/dL (ref 3.5–5.2)
Alkaline Phosphatase: 60 U/L (ref 39–117)
Bilirubin, Direct: 0.1 mg/dL (ref 0.0–0.3)
Total Bilirubin: 0.7 mg/dL (ref 0.3–1.2)
Total Protein: 7.2 g/dL (ref 6.0–8.3)

## 2010-09-21 LAB — TSH: TSH: 2.4 u[IU]/mL (ref 0.35–5.50)

## 2010-09-21 MED ORDER — HYDROCHLOROTHIAZIDE 25 MG PO TABS: 25.0000 mg | ORAL_TABLET | Freq: Every day | ORAL | Status: DC

## 2010-09-21 MED ORDER — FLUOXETINE HCL 10 MG PO CAPS: ORAL_CAPSULE | ORAL | Status: DC

## 2010-09-21 NOTE — Progress Notes (Signed)
[  Subjective:     Kelly [name] is a 45 y.o. female and is here for a comprehensive physical exam. The patient reports no problems.  History   Social History  . Marital Status: Single    Spouse Name: N/A    Number of Children: N/A  . Years of Education: N/A   Occupational History  . [employer]-- lab assistant    Social History Main Topics  . Smoking status: Never Smoker   . Smokeless tobacco: Never Used  . Alcohol Use: 4.8 oz/week    8 Glasses of wine per week  . Drug Use: No  . Sexually Active: No   Other Topics Concern  . Not on file   Social History Narrative  . No narrative on file   Health Maintenance  Topic Date Due  . Pap Smear  11/20/2012  . Tetanus/tdap  08/08/2016    The following portions of the patient's history were reviewed and updated as appropriate: allergies, current medications, past family history, past medical history, past social history, past surgical history and problem list.  Review of Systems Review of Systems  Constitutional: Negative for activity change, appetite change and fatigue.  HENT: Negative for hearing loss, congestion, tinnitus and ear discharge.  dentist q61m Eyes: Negative for visual disturbance (see optho q2y -- vision corrected to 20/20 with glasses).  Respiratory: Negative for cough, chest tightness and shortness of breath.   Cardiovascular: Negative for chest pain, palpitations and leg swelling.  Gastrointestinal: Negative for abdominal pain, diarrhea, constipation and abdominal distention.  Genitourinary: Negative for urgency, frequency, decreased urine volume and difficulty urinating.  Musculoskeletal: Negative for back pain, arthralgias and gait problem.  Skin: Negative for color change, pallor and rash.  Neurological: Negative for dizziness, light-headedness, numbness and headaches.  Hematological: Negative for adenopathy. Does not bruise/bleed easily.  Psychiatric/Behavioral: Negative for suicidal ideas, confusion, sleep  disturbance, self-injury, dysphoric mood, decreased concentration and agitation.       Objective:    BP 132/76  Pulse 77  Temp(Src) 99 F (37.2 C) (Oral)  Ht 5' 4.25" (1.632 m)  Wt 170 lb 3.2 oz (77.202 kg)  BMI 28.99 kg/m2  SpO2 99% General appearance: alert, cooperative, appears stated age and no distress Head: Normocephalic, without obvious abnormality, atraumatic Eyes: conjunctivae/corneas clear. PERRL, EOM's intact. Fundi benign. Ears: normal TM's and external ear canals both ears Nose: Nares normal. Septum midline. Mucosa normal. No drainage or sinus tenderness. Throat: lips, mucosa, and tongue normal; teeth and gums normal Neck: no adenopathy, no carotid bruit, no JVD, supple, symmetrical, trachea midline and thyroid not enlarged, symmetric, no tenderness/mass/nodules Lungs: clear to auscultation bilaterally Breasts: gyn Heart: regular rate and rhythm, S1, S2 normal, no murmur, click, rub or gallop Abdomen: soft, non-tender; bowel sounds normal; no masses,  no organomegaly Pelvic: gyn Extremities: extremities normal, atraumatic, no cyanosis or edema Pulses: 2+ and symmetric Skin: Skin color, texture, turgor normal. No rashes or lesions Lymph nodes: Cervical, supraclavicular, and axillary nodes normal. Neurologic: Grossly normal psych--no depression/ anxiety    Assessment:    Healthy female exam.   Depression Plan:    ghm utd Mammo, pap per gyn Check labs See After Visit Summary for Counseling Recommendations

## 2010-09-21 NOTE — Patient Instructions (Signed)

## 2011-01-15 ENCOUNTER — Ambulatory Visit (INDEPENDENT_AMBULATORY_CARE_PROVIDER_SITE_OTHER): Payer: 59 | Admitting: Family Medicine

## 2011-01-15 ENCOUNTER — Encounter: Payer: Self-pay | Admitting: Family Medicine

## 2011-01-15 VITALS — BP 120/70 | HR 94 | Temp 98.6°F | Ht 64.25 in | Wt 168.8 lb

## 2011-01-15 DIAGNOSIS — F329 Major depressive disorder, single episode, unspecified: Secondary | ICD-10-CM

## 2011-01-15 DIAGNOSIS — F32A Depression, unspecified: Secondary | ICD-10-CM | POA: Insufficient documentation

## 2011-01-15 MED ORDER — BUPROPION HCL ER (XL) 150 MG PO TB24: ORAL_TABLET | ORAL | Status: DC

## 2011-01-15 NOTE — Progress Notes (Signed)
  Subjective:    Patient ID: Kelly [name], female    DOB: [birth year], 45 y.o.   MRN: [id number]  HPI Pt here f/u depression.  She still feels tired and draggy--- Really feels no difference with 30 mg prozac.       Review of Systems    as above Objective:   Physical Exam  Constitutional: She is oriented to person, place, and time. She appears well-developed and well-nourished.  Pulmonary/Chest: No respiratory distress. She has no wheezes. She has no rales. She exhibits no tenderness.  Neurological: She is alert and oriented to person, place, and time.  Psychiatric: She has a normal mood and affect. Her behavior is normal. Judgment and thought content normal.          Assessment & Plan:

## 2011-01-15 NOTE — Assessment & Plan Note (Signed)
con't prozac Add wellbutrin xl 150 mg qd for 1 week then increase to 2 po qd  rto 4-6 weeks

## 2011-01-15 NOTE — Patient Instructions (Signed)

## 2011-02-03 ENCOUNTER — Other Ambulatory Visit: Payer: Self-pay | Admitting: Family Medicine

## 2011-02-05 NOTE — Telephone Encounter (Signed)
Faxed.   KP 

## 2011-02-05 NOTE — Telephone Encounter (Signed)
Last seen 01/15/11 and filled 07/24/10 #30 with 1 refill

## 2011-02-19 ENCOUNTER — Encounter: Payer: Self-pay | Admitting: Family Medicine

## 2011-02-19 ENCOUNTER — Ambulatory Visit (INDEPENDENT_AMBULATORY_CARE_PROVIDER_SITE_OTHER): Payer: 59 | Admitting: Family Medicine

## 2011-02-19 ENCOUNTER — Ambulatory Visit: Payer: 59 | Admitting: Family Medicine

## 2011-02-19 VITALS — BP 116/74 | HR 83 | Temp 98.0°F | Wt 167.8 lb

## 2011-02-19 DIAGNOSIS — F329 Major depressive disorder, single episode, unspecified: Secondary | ICD-10-CM

## 2011-02-19 NOTE — Assessment & Plan Note (Signed)
Improved con't meds rto 3 months or sooner prn

## 2011-02-19 NOTE — Patient Instructions (Signed)

## 2011-02-19 NOTE — Progress Notes (Signed)
  Subjective:    Patient ID: Kelly [name], female    DOB: Jul 26, 1965, 45 y.o.   MRN: [id number]  HPI Pt here for f/u depression.  She is doing much better.  She is not as moody and is able to handle things better.   Review of Systems    as above Objective:   Physical Exam  Constitutional: She is oriented to person, place, and time. She appears well-developed and well-nourished.  Pulmonary/Chest: Breath sounds normal.  Neurological: She is alert and oriented to person, place, and time.  Psychiatric: She has a normal mood and affect. Her behavior is normal. Judgment and thought content normal.          Assessment & Plan:

## 2011-05-14 ENCOUNTER — Telehealth: Payer: Self-pay | Admitting: Family Medicine

## 2011-05-14 MED ORDER — BUPROPION HCL ER (XL) 150 MG PO TB24: 300.0000 mg | ORAL_TABLET | ORAL | Status: DC

## 2011-05-14 NOTE — Telephone Encounter (Signed)
Refill: Bupropion HCL 150 mg xl tab. Take 1 tablet by mouth every day for 7 days and then take two tablets in the morning thereafter. Qty 60. Last fill 03-30-11.

## 2011-05-21 ENCOUNTER — Ambulatory Visit: Payer: 59 | Admitting: Family Medicine

## 2011-05-22 ENCOUNTER — Ambulatory Visit: Payer: 59 | Admitting: Family Medicine

## 2011-05-28 ENCOUNTER — Encounter: Payer: Self-pay | Admitting: Family Medicine

## 2011-05-28 ENCOUNTER — Ambulatory Visit (INDEPENDENT_AMBULATORY_CARE_PROVIDER_SITE_OTHER): Payer: 59 | Admitting: Family Medicine

## 2011-05-28 VITALS — BP 112/80 | HR 93 | Temp 98.5°F | Wt 165.8 lb

## 2011-05-28 DIAGNOSIS — F329 Major depressive disorder, single episode, unspecified: Secondary | ICD-10-CM

## 2011-05-28 MED ORDER — FLUOXETINE HCL 40 MG PO CAPS: 40.0000 mg | ORAL_CAPSULE | Freq: Every day | ORAL | Status: DC

## 2011-05-28 NOTE — Progress Notes (Signed)
  Subjective:    Patient ID: Kelly [name], female    DOB: Aug 22, 1965, 46 y.o.   MRN: [id number]  HPI  Pt here requesting to change prozac to 40 mg because the 3 tablets (10 mg x3) was too much with her lap band.  She also does not seem to notice a change with 30 mg. Review of Systems As above    Objective:   Physical Exam  Constitutional: She appears well-developed and well-nourished.  Psychiatric: She has a normal mood and affect. Her behavior is normal. Judgment and thought content normal.          Assessment & Plan:

## 2011-05-28 NOTE — Assessment & Plan Note (Signed)
Inc prozac to 40 mg daily con't wellbutrin And  xanax

## 2011-05-28 NOTE — Patient Instructions (Signed)

## 2011-05-31 ENCOUNTER — Telehealth (INDEPENDENT_AMBULATORY_CARE_PROVIDER_SITE_OTHER): Payer: Self-pay | Admitting: General Surgery

## 2011-05-31 NOTE — Telephone Encounter (Signed)
Patient called stating she's not able to keep foods down, she feel's her lap band need's fluid taken out, Dr. Johna Sheriff not available, patient scheduled to see Dr. Biagio Quint on 06/04/11 @ 9:45 am.

## 2011-06-01 ENCOUNTER — Ambulatory Visit (INDEPENDENT_AMBULATORY_CARE_PROVIDER_SITE_OTHER): Payer: 59 | Admitting: Surgery

## 2011-06-01 ENCOUNTER — Encounter (INDEPENDENT_AMBULATORY_CARE_PROVIDER_SITE_OTHER): Payer: Self-pay | Admitting: Surgery

## 2011-06-01 VITALS — BP 144/86 | HR 76 | Temp 98.6°F | Resp 18 | Ht 64.0 in | Wt 162.0 lb

## 2011-06-01 DIAGNOSIS — Z9884 Bariatric surgery status: Secondary | ICD-10-CM | POA: Insufficient documentation

## 2011-06-01 NOTE — Progress Notes (Signed)
Mrs. Holster comes in today still feeling like she is a little too tight. She gets some reflux and wanted some more fluid out. I were removed another 0.2 cc from her band. I advised her to come back and see Dr. Dillard Essex in 2 months or at least let us know if she's doing well and she did cancel and followup as needed.

## 2011-06-04 ENCOUNTER — Encounter (INDEPENDENT_AMBULATORY_CARE_PROVIDER_SITE_OTHER): Payer: 59 | Admitting: General Surgery

## 2011-06-08 ENCOUNTER — Encounter (INDEPENDENT_AMBULATORY_CARE_PROVIDER_SITE_OTHER): Payer: 59 | Admitting: General Surgery

## 2011-08-02 ENCOUNTER — Other Ambulatory Visit: Payer: Self-pay | Admitting: Family Medicine

## 2011-08-15 ENCOUNTER — Encounter: Payer: Self-pay | Admitting: Family Medicine

## 2011-08-15 ENCOUNTER — Ambulatory Visit (INDEPENDENT_AMBULATORY_CARE_PROVIDER_SITE_OTHER): Payer: 59 | Admitting: Family Medicine

## 2011-08-15 VITALS — BP 118/78 | HR 73 | Temp 98.9°F | Wt 162.8 lb

## 2011-08-15 DIAGNOSIS — B351 Tinea unguium: Secondary | ICD-10-CM | POA: Insufficient documentation

## 2011-08-15 DIAGNOSIS — N12 Tubulo-interstitial nephritis, not specified as acute or chronic: Secondary | ICD-10-CM

## 2011-08-15 MED ORDER — TERBINAFINE HCL 250 MG PO TABS: 250.0000 mg | ORAL_TABLET | Freq: Every day | ORAL | Status: DC

## 2011-08-15 NOTE — Assessment & Plan Note (Signed)
lamisil for 3 months Check hep today,  In 1 1/2 months and 3months.

## 2011-08-15 NOTE — Patient Instructions (Signed)
Ringworm, Nail  A fungal infection of the nail (tinea unguium/onychomycosis) is common. It is common as the visible part of the nail is composed of dead cells which have no blood supply to help prevent infection. It occurs because fungi are everywhere and will pick any opportunity to grow on any dead material.  Because nails are very slow growing they require up to 2 years of treatment with anti-fungal medications. The entire nail back to the base is infected. This includes approximately ? of the nail which you cannot see.  If your caregiver has prescribed a medication by mouth, take it every day and as directed. No progress will be seen for at least 6 to 9 months. Do not be disappointed! Because fungi live on dead cells with little or no exposure to blood supply, medication delivery to the infection is slow; thus the cure is slow. It is also why you can observe no progress in the first 6 months. The nail becoming cured is the base of the nail, as it has the blood supply. Topical medication such as creams and ointments are usually not effective. Important in successful treatment of nail fungus is closely following the medication regimen that your doctor prescribes.  Sometimes you and your caregiver may elect to speed up this process by surgical removal of all the nails. Even this may still require 6 to 9 months of additional oral medications.  See your caregiver as directed. Remember there will be no visible improvement for at least 6 months. See your caregiver sooner if other signs of infection (redness and swelling) develop.  Document Released: 03/09/2000 Document Revised: 03/01/2011 Document Reviewed: 05/18/2008  ExitCare Patient Information 2012 [employer].

## 2011-08-15 NOTE — Progress Notes (Signed)
  Subjective:    Patient ID: Kelly [name], female    DOB: January 24, 1966, 46 y.o.   MRN: [id number]  HPI Pt here c/o nail fungus on toes and she wants something done about it.  No other complaints.    Review of Systems As above    Objective:   Physical Exam  Constitutional: She is oriented to person, place, and time. She appears well-developed and well-nourished.  Neurological: She is alert and oriented to person, place, and time.  Skin:       Nails on both toes cracked, yellow and thick  Psychiatric: She has a normal mood and affect. Her behavior is normal. Judgment and thought content normal.          Assessment & Plan:

## 2011-08-16 ENCOUNTER — Encounter (INDEPENDENT_AMBULATORY_CARE_PROVIDER_SITE_OTHER): Payer: Self-pay | Admitting: General Surgery

## 2011-08-16 ENCOUNTER — Ambulatory Visit (INDEPENDENT_AMBULATORY_CARE_PROVIDER_SITE_OTHER): Payer: 59 | Admitting: General Surgery

## 2011-08-16 VITALS — BP 123/92 | HR 93 | Temp 98.3°F | Ht 64.0 in | Wt 160.2 lb

## 2011-08-16 DIAGNOSIS — Z9884 Bariatric surgery status: Secondary | ICD-10-CM

## 2011-08-16 LAB — HEPATIC FUNCTION PANEL
ALT: 18 U/L (ref 0–35)
Bilirubin, Direct: 0.1 mg/dL (ref 0.0–0.3)
Total Bilirubin: 0.9 mg/dL (ref 0.3–1.2)

## 2011-08-16 NOTE — Progress Notes (Signed)
History: Patient returned for follow up of lab and placed February 2011. She presented about 6 weeks ago with some persistent reflux and food intolerance. Dr. Daphine Deutscher removed a small amount of fluid from her band but she continues to have solid food intolerance and frequent vomiting and reflux.  Exam: Weight is down 2 pounds from March and 96 pounds from preop Gen.: Appears well. Abdomen: Soft and nontender port site looks fine  Assessment and plan: Status post lap band with over restriction. I removed 3 cc more than today which did leave her with 2.8 cc. She immediately noticed improvement as she was able to swallow water. We will see how she does for several weeks with a partial holiday and I will see her back.

## 2011-09-20 ENCOUNTER — Encounter (INDEPENDENT_AMBULATORY_CARE_PROVIDER_SITE_OTHER): Payer: Self-pay | Admitting: Physician Assistant

## 2011-09-20 ENCOUNTER — Ambulatory Visit (INDEPENDENT_AMBULATORY_CARE_PROVIDER_SITE_OTHER): Payer: 59 | Admitting: Physician Assistant

## 2011-09-20 VITALS — BP 138/88 | HR 80 | Temp 99.1°F | Resp 16 | Ht 64.0 in | Wt 177.8 lb

## 2011-09-20 DIAGNOSIS — Z4651 Encounter for fitting and adjustment of gastric lap band: Secondary | ICD-10-CM

## 2011-09-20 NOTE — Progress Notes (Signed)
  HISTORY: Kelly [name] is a 46 y.o.female who received an AP-Standard lap-band in February 2011 by Dr. Johna Sheriff. She comes in having last been seen a month ago for a lap band holiday and has gained 17 lbs. Fortunately, she does not have any further solid food dysphagia. She is hungry and eating larger portion sizes. She wants an upward adjustment today to get back on track.  VITAL SIGNS: Filed Vitals:   09/20/11 1025  BP: 138/88  Pulse: 80  Temp: 99.1 F (37.3 C)  Resp: 16    PHYSICAL EXAM: Physical exam reveals a very well-appearing 46 y.o.female in no apparent distress Neurologic: Awake, alert, oriented Psych: Bright affect, conversant Respiratory: Breathing even and unlabored. No stridor or wheezing Abdomen: Soft, nontender, nondistended to palpation. Incisions well-healed. No incisional hernias. Port easily palpated. Extremities: Atraumatic, good range of motion.  ASSESMENT: 46 y.o.  female  s/p AP-Standard lap-band.   PLAN: The patient's port was accessed with a 20G Huber needle without difficulty. Clear fluid was aspirated and 2.2 mL saline was added to the port to give a total predicted volume of 5 mL. The patient was able to swallow water without difficulty following the procedure and was instructed to take clear liquids for the next 24-48 hours and advance slowly as tolerated.

## 2011-09-20 NOTE — Patient Instructions (Signed)
Take clear liquids tonight. Thin protein shakes are ok to start tomorrow morning. Slowly advance your diet thereafter. Call us if you have persistent vomiting or regurgitation, night cough or reflux symptoms. Return as scheduled or sooner if you notice no changes in hunger/portion sizes.  

## 2011-10-03 ENCOUNTER — Other Ambulatory Visit: Payer: Self-pay | Admitting: Family Medicine

## 2011-10-08 ENCOUNTER — Ambulatory Visit (INDEPENDENT_AMBULATORY_CARE_PROVIDER_SITE_OTHER): Payer: 59

## 2011-10-08 DIAGNOSIS — Z111 Encounter for screening for respiratory tuberculosis: Secondary | ICD-10-CM

## 2011-10-10 LAB — TB SKIN TEST
Induration: 0 mm
TB Skin Test: NEGATIVE

## 2011-10-12 ENCOUNTER — Telehealth (INDEPENDENT_AMBULATORY_CARE_PROVIDER_SITE_OTHER): Payer: Self-pay

## 2011-10-12 NOTE — Telephone Encounter (Signed)
Return call to patient-- patient requested to be r/s for earlier appt.  Patient given appt for 10/18/11 @ 10:45 am w/[facility].

## 2011-10-12 NOTE — Telephone Encounter (Signed)
Message copied by Maryan Puls on Fri Oct 12, 2011  5:57 PM ------      Message from: Marchia Bond      Created: Fri Oct 12, 2011 10:13 AM      Regarding: needs a sooner appt      Contact: [phone]       [name] pt would like to be seen sooner she is ganging weight and cant wait till sept to see andy. Call pt back at [phone]

## 2011-10-18 ENCOUNTER — Ambulatory Visit (INDEPENDENT_AMBULATORY_CARE_PROVIDER_SITE_OTHER): Payer: 59 | Admitting: Physician Assistant

## 2011-10-18 ENCOUNTER — Encounter (INDEPENDENT_AMBULATORY_CARE_PROVIDER_SITE_OTHER): Payer: Self-pay

## 2011-10-18 VITALS — BP 140/74 | Ht 64.0 in | Wt 182.8 lb

## 2011-10-18 DIAGNOSIS — Z4651 Encounter for fitting and adjustment of gastric lap band: Secondary | ICD-10-CM

## 2011-10-18 NOTE — Progress Notes (Signed)
  HISTORY: Kelly [name] is a 46 y.o.female who received an AP-Standard lap-band in February 2011 by Dr. Johna Sheriff. She comes in with persistent hunger but better than prior. No regurgitation.  VITAL SIGNS: Filed Vitals:   10/18/11 1118  BP: 140/74    PHYSICAL EXAM: Physical exam reveals a very well-appearing 46 y.o.female in no apparent distress Neurologic: Awake, alert, oriented Psych: Bright affect, conversant Respiratory: Breathing even and unlabored. No stridor or wheezing Abdomen: Soft, nontender, nondistended to palpation. Incisions well-healed. No incisional hernias. Port easily palpated. Extremities: Atraumatic, good range of motion.  ASSESMENT: 46 y.o.  female  s/p AP-Standard lap-band.   PLAN: The patient's port was accessed with a 20G Huber needle without difficulty. Clear fluid was aspirated and 0.5 mL saline was added to the port to give a total predicted volume of 5.5 mL. The patient was able to swallow water without difficulty following the procedure and was instructed to take clear liquids for the next 24-48 hours and advance slowly as tolerated.

## 2011-10-18 NOTE — Patient Instructions (Signed)
Take clear liquids tonight. Thin protein shakes are ok to start tomorrow morning. Slowly advance your diet thereafter. Call us if you have persistent vomiting or regurgitation, night cough or reflux symptoms. Return as scheduled or sooner if you notice no changes in hunger/portion sizes.  

## 2011-10-23 ENCOUNTER — Other Ambulatory Visit: Payer: Self-pay | Admitting: Family Medicine

## 2011-10-24 NOTE — Telephone Encounter (Signed)
Last seen 08/15/11 and filled 02/03/11 # 30 with 1 refill. Please advise    KP

## 2011-11-28 ENCOUNTER — Ambulatory Visit: Payer: 59 | Admitting: Family Medicine

## 2011-12-20 ENCOUNTER — Encounter (INDEPENDENT_AMBULATORY_CARE_PROVIDER_SITE_OTHER): Payer: 59

## 2012-02-15 ENCOUNTER — Telehealth (INDEPENDENT_AMBULATORY_CARE_PROVIDER_SITE_OTHER): Payer: Self-pay

## 2012-02-15 ENCOUNTER — Encounter (INDEPENDENT_AMBULATORY_CARE_PROVIDER_SITE_OTHER): Payer: Self-pay | Admitting: General Surgery

## 2012-02-15 ENCOUNTER — Ambulatory Visit (INDEPENDENT_AMBULATORY_CARE_PROVIDER_SITE_OTHER): Payer: 59 | Admitting: General Surgery

## 2012-02-15 VITALS — BP 122/68 | HR 74 | Temp 97.8°F | Resp 16 | Ht 64.0 in | Wt 171.5 lb

## 2012-02-15 DIAGNOSIS — Z9884 Bariatric surgery status: Secondary | ICD-10-CM

## 2012-02-15 NOTE — Progress Notes (Signed)
Chief complaint nausea vomiting post lap band  History: Patient comes to the office due to increased nausea and vomiting. She has a lap band surgery date of February 2011. She has had overall excellent weight loss but has had to have fluid removed on several occasions due to over restriction. She seemed to do well after a fill for some period of time and gradually develops increasing difficulty with foods including regurgitation of food in mucus which then we'll collated nausea and vomiting. She ate some chicken about 3 days ago which she threw up and since then has been unable to tolerate solids and now liquids.  Exam: BP 122/68  Pulse 74  Temp 97.8 F (36.6 C) (Temporal)  Resp 16  Ht 5\' 4"  (1.626 m)  Wt 171 lb 8 oz (77.792 kg)  BMI 29.44 kg/m2 Total weight loss 84 pounds down 11 pounds from last visit General: Appears well Abdomen soft and nontender port site looks fine  Assessment and plan: Lap band without restriction. We removed 1 cc today with some immediate relief. She was able to tolerate water. She seems to have a very narrow range adjustment. I will get a barium swallow to look for any anatomic problems. She will return in one month.

## 2012-02-15 NOTE — Telephone Encounter (Signed)
Patient cannot keep liquids down, patient scheduled today @ 1:45 w/Dr. Johna Sheriff for further evaluation.  Patient last lap band fill on 10/18/11, placed in port 0.5 mL.

## 2012-02-19 ENCOUNTER — Telehealth (INDEPENDENT_AMBULATORY_CARE_PROVIDER_SITE_OTHER): Payer: Self-pay

## 2012-02-19 NOTE — Telephone Encounter (Signed)
Voice message left for patient with appointment date & time for Barium Swallow on 02/29/12 @ 1:15pm @ [facility] [address]

## 2012-02-29 ENCOUNTER — Other Ambulatory Visit: Payer: 59

## 2012-04-17 ENCOUNTER — Encounter (INDEPENDENT_AMBULATORY_CARE_PROVIDER_SITE_OTHER): Payer: 59

## 2012-06-03 ENCOUNTER — Telehealth (INDEPENDENT_AMBULATORY_CARE_PROVIDER_SITE_OTHER): Payer: Self-pay | Admitting: General Surgery

## 2012-06-03 NOTE — Telephone Encounter (Signed)
Left message with patient  For DG Esopghagus 06/12/12 1045

## 2012-06-12 ENCOUNTER — Other Ambulatory Visit: Payer: 59

## 2012-06-12 ENCOUNTER — Encounter (INDEPENDENT_AMBULATORY_CARE_PROVIDER_SITE_OTHER): Payer: PRIVATE HEALTH INSURANCE

## 2012-06-13 ENCOUNTER — Ambulatory Visit
Admission: RE | Admit: 2012-06-13 | Discharge: 2012-06-13 | Disposition: A | Discharge status: Home / Self Care | Payer: 59 | Source: Ambulatory Visit | Attending: General Surgery | Admitting: General Surgery

## 2012-06-13 DIAGNOSIS — Z9884 Bariatric surgery status: Secondary | ICD-10-CM

## 2012-06-20 ENCOUNTER — Telehealth (INDEPENDENT_AMBULATORY_CARE_PROVIDER_SITE_OTHER): Payer: Self-pay

## 2012-06-20 NOTE — Telephone Encounter (Signed)
Message copied by Maryan Puls on Fri Jun 20, 2012  2:53 PM ------      Message from: Glenna Fellows T      Created: Tue Jun 17, 2012  3:49 PM       Can you let pt know this looks OK.  Does not look like we have seen her since Nov and I can't recall why we got this.  Does she have a F/U appt? ------

## 2012-06-20 NOTE — Telephone Encounter (Signed)
Called patient with Barium Swallow results (okay) Patient has follow up appointment on 07/01/12 w/Dr. Johna Sheriff

## 2012-06-20 NOTE — Progress Notes (Signed)
Patient was seen back in November 2013 due to not being able to keep liquids down.  Her last office visit was 02/15/12 and you removed 1 cc of fluid from her lap band.  Also, you ordered a Barium Swallow to look for any anatomic problems.  Patient was scheduled for her Swallow in December but, cancelled it.  She called our office to have it rescheduled which we did.

## 2012-07-01 ENCOUNTER — Ambulatory Visit (INDEPENDENT_AMBULATORY_CARE_PROVIDER_SITE_OTHER): Payer: 59 | Admitting: General Surgery

## 2012-07-01 ENCOUNTER — Encounter (INDEPENDENT_AMBULATORY_CARE_PROVIDER_SITE_OTHER): Payer: Self-pay | Admitting: General Surgery

## 2012-07-01 VITALS — BP 132/82 | HR 74 | Temp 98.8°F | Resp 18 | Ht 64.0 in | Wt 213.0 lb

## 2012-07-01 DIAGNOSIS — Z4651 Encounter for fitting and adjustment of gastric lap band: Secondary | ICD-10-CM

## 2012-07-01 DIAGNOSIS — E669 Obesity, unspecified: Secondary | ICD-10-CM

## 2012-07-01 DIAGNOSIS — Z9884 Bariatric surgery status: Secondary | ICD-10-CM | POA: Insufficient documentation

## 2012-07-01 NOTE — Progress Notes (Signed)
Chief complaint: Followup lap band  History: Patient returns for followup lab and placed 05/23/2009. She had excellent weight loss although has had some narrow range of adjustment with fluid being removed and then on several occasions. She was last seen in November of last year with definite over restriction after a fill 10 months previously. We removed 1 cc. Since then she has been able to eat much more that she feels she should. We recommended an upper GI series at her last visit which actually was just completed and it shows the band in good position without slip or other complication.  Exam: BP 132/82  Pulse 74  Temp(Src) 98.8 F (37.1 C)  Resp 18  Ht 5\' 4"  (1.626 m)  Wt 213 lb (96.616 kg)  BMI 36.54 kg/m2  LMP 05/24/2012 Weight loss 43 pounds, 41 pounds from last visit General: Overweight but appears well Abdomen: Soft nontender. Port site looks fine.  Assessment and plan: She clearly at this point he is under restricted and I see no evidence of slip or other complication. After discussion today we added 0.5 cc to her brand which did bring her up to 5 cc. Her ultimate volume should be between 5-1/4 in 5.5 cc. She will return in 6 weeks

## 2012-07-24 ENCOUNTER — Encounter (INDEPENDENT_AMBULATORY_CARE_PROVIDER_SITE_OTHER): Payer: 59

## 2012-07-25 ENCOUNTER — Encounter: Payer: 59 | Admitting: Family Medicine

## 2012-07-31 ENCOUNTER — Encounter (INDEPENDENT_AMBULATORY_CARE_PROVIDER_SITE_OTHER): Payer: 59

## 2012-08-15 ENCOUNTER — Encounter (INDEPENDENT_AMBULATORY_CARE_PROVIDER_SITE_OTHER): Payer: 59 | Admitting: General Surgery

## 2012-09-02 ENCOUNTER — Other Ambulatory Visit: Payer: Self-pay | Admitting: Obstetrics and Gynecology

## 2012-09-02 DIAGNOSIS — R928 Other abnormal and inconclusive findings on diagnostic imaging of breast: Secondary | ICD-10-CM

## 2012-09-05 ENCOUNTER — Encounter: Payer: 59 | Admitting: Family Medicine

## 2012-09-05 ENCOUNTER — Ambulatory Visit (INDEPENDENT_AMBULATORY_CARE_PROVIDER_SITE_OTHER): Payer: 59 | Admitting: Family Medicine

## 2012-09-05 ENCOUNTER — Encounter: Payer: Self-pay | Admitting: Family Medicine

## 2012-09-05 VITALS — BP 116/72 | HR 72 | Temp 98.5°F | Ht 63.5 in | Wt 211.4 lb

## 2012-09-05 DIAGNOSIS — M791 Myalgia, unspecified site: Secondary | ICD-10-CM

## 2012-09-05 DIAGNOSIS — F419 Anxiety disorder, unspecified: Secondary | ICD-10-CM

## 2012-09-05 DIAGNOSIS — I10 Essential (primary) hypertension: Secondary | ICD-10-CM

## 2012-09-05 DIAGNOSIS — IMO0001 Reserved for inherently not codable concepts without codable children: Secondary | ICD-10-CM

## 2012-09-05 DIAGNOSIS — Z0000 Encounter for general adult medical examination without abnormal findings: Secondary | ICD-10-CM

## 2012-09-05 DIAGNOSIS — F411 Generalized anxiety disorder: Secondary | ICD-10-CM

## 2012-09-05 LAB — CBC WITH DIFFERENTIAL/PLATELET
Eosinophils Absolute: 0.7 10*3/uL (ref 0.0–0.7)
Hemoglobin: 13 g/dL (ref 12.0–15.0)
Lymphocytes Relative: 27 % (ref 12–46)
Lymphs Abs: 2.5 10*3/uL (ref 0.7–4.0)
Monocytes Relative: 4 % (ref 3–12)
Neutro Abs: 5.8 10*3/uL (ref 1.7–7.7)
Neutrophils Relative %: 61 % (ref 43–77)
Platelets: 342 10*3/uL (ref 150–400)
RBC: 4.34 MIL/uL (ref 3.87–5.11)
WBC: 9.5 10*3/uL (ref 4.0–10.5)

## 2012-09-05 LAB — LIPID PANEL
HDL: 64 mg/dL (ref >39)
Total CHOL/HDL Ratio: 2.7 Ratio
VLDL: 16 mg/dL (ref 0–40)

## 2012-09-05 LAB — HEPATIC FUNCTION PANEL
Albumin: 3.9 g/dL (ref 3.5–5.2)
Alkaline Phosphatase: 57 U/L (ref 39–117)
Bilirubin, Direct: 0.2 mg/dL (ref 0.0–0.3)
Indirect Bilirubin: 1 mg/dL — ABNORMAL HIGH (ref 0.0–0.9)
Total Bilirubin: 1.2 mg/dL (ref 0.3–1.2)

## 2012-09-05 LAB — POCT URINALYSIS DIPSTICK
Ketones, UA: NEGATIVE
Leukocytes, UA: NEGATIVE
Protein, UA: NEGATIVE
Spec Grav, UA: >=1.03
pH, UA: 6

## 2012-09-05 LAB — T3, FREE: T3, Free: 2.3 pg/mL (ref 2.3–4.2)

## 2012-09-05 LAB — BASIC METABOLIC PANEL
BUN: 9 mg/dL (ref 6–23)
Creat: 0.81 mg/dL (ref 0.50–1.10)

## 2012-09-05 MED ORDER — ALPRAZOLAM 0.25 MG PO TABS: ORAL_TABLET | ORAL | Status: DC

## 2012-09-05 MED ORDER — PHENTERMINE HCL 37.5 MG PO TABS: 37.5000 mg | ORAL_TABLET | Freq: Every day | ORAL | Status: DC

## 2012-09-05 NOTE — Patient Instructions (Addendum)
Preventive Care for Adults, Female A healthy lifestyle and preventive care can promote health and wellness. Preventive health guidelines for women include the following key practices.  A routine yearly physical is a good way to check with your caregiver about your health and preventive screening. It is a chance to share any concerns and updates on your health, and to receive a thorough exam.  Visit your dentist for a routine exam and preventive care every 6 months. Brush your teeth twice a day and floss once a day. Good oral hygiene prevents tooth decay and gum disease.  The frequency of eye exams is based on your age, health, family medical history, use of contact lenses, and other factors. Follow your caregiver's recommendations for frequency of eye exams.  Eat a healthy diet. Foods like vegetables, fruits, whole grains, low-fat dairy products, and lean protein foods contain the nutrients you need without too many calories. Decrease your intake of foods high in solid fats, added sugars, and salt. Eat the right amount of calories for you.Get information about a proper diet from your caregiver, if necessary.  Regular physical exercise is one of the most important things you can do for your health. Most adults should get at least 150 minutes of moderate-intensity exercise (any activity that increases your heart rate and causes you to sweat) each week. In addition, most adults need muscle-strengthening exercises on 2 or more days a week.  Maintain a healthy weight. The body mass index (BMI) is a screening tool to identify possible weight problems. It provides an estimate of body fat based on height and weight. Your caregiver can help determine your BMI, and can help you achieve or maintain a healthy weight.For adults 20 years and older:  A BMI below 18.5 is considered underweight.  A BMI of 18.5 to 24.9 is normal.  A BMI of 25 to 29.9 is considered overweight.  A BMI of 30 and above is  considered obese.  Maintain normal blood lipids and cholesterol levels by exercising and minimizing your intake of saturated fat. Eat a balanced diet with plenty of fruit and vegetables. Blood tests for lipids and cholesterol should begin at age 20 and be repeated every 5 years. If your lipid or cholesterol levels are high, you are over 50, or you are at high risk for heart disease, you may need your cholesterol levels checked more frequently.Ongoing high lipid and cholesterol levels should be treated with medicines if diet and exercise are not effective.  If you smoke, find out from your caregiver how to quit. If you do not use tobacco, do not start.  If you are pregnant, do not drink alcohol. If you are breastfeeding, be very cautious about drinking alcohol. If you are not pregnant and choose to drink alcohol, do not exceed 1 drink per day. One drink is considered to be 12 ounces (355 mL) of beer, 5 ounces (148 mL) of wine, or 1.5 ounces (44 mL) of liquor.  Avoid use of street drugs. Do not share needles with anyone. Ask for help if you need support or instructions about stopping the use of drugs.  High blood pressure causes heart disease and increases the risk of stroke. Your blood pressure should be checked at least every 1 to 2 years. Ongoing high blood pressure should be treated with medicines if weight loss and exercise are not effective.  If you are 55 to 48 years old, ask your caregiver if you should take aspirin to prevent strokes.  Diabetes   screening involves taking a blood sample to check your fasting blood sugar level. This should be done once every 3 years, after age 45, if you are within normal weight and without risk factors for diabetes. Testing should be considered at a younger age or be carried out more frequently if you are overweight and have at least 1 risk factor for diabetes.  Breast cancer screening is essential preventive care for women. You should practice "breast  self-awareness." This means understanding the normal appearance and feel of your breasts and may include breast self-examination. Any changes detected, no matter how small, should be reported to a caregiver. Women in their 20s and 30s should have a clinical breast exam (CBE) by a caregiver as part of a regular health exam every 1 to 3 years. After age 40, women should have a CBE every year. Starting at age 40, women should consider having a mammography (breast X-ray test) every year. Women who have a family history of breast cancer should talk to their caregiver about genetic screening. Women at a high risk of breast cancer should talk to their caregivers about having magnetic resonance imaging (MRI) and a mammography every year.  The Pap test is a screening test for cervical cancer. A Pap test can show cell changes on the cervix that might become cervical cancer if left untreated. A Pap test is a procedure in which cells are obtained and examined from the lower end of the uterus (cervix).  Women should have a Pap test starting at age 21.  Between ages 21 and 29, Pap tests should be repeated every 2 years.  Beginning at age 30, you should have a Pap test every 3 years as long as the past 3 Pap tests have been normal.  Some women have medical problems that increase the chance of getting cervical cancer. Talk to your caregiver about these problems. It is especially important to talk to your caregiver if a new problem develops soon after your last Pap test. In these cases, your caregiver may recommend more frequent screening and Pap tests.  The above recommendations are the same for women who have or have not gotten the vaccine for human papillomavirus (HPV).  If you had a hysterectomy for a problem that was not cancer or a condition that could lead to cancer, then you no longer need Pap tests. Even if you no longer need a Pap test, a regular exam is a good idea to make sure no other problems are  starting.  If you are between ages 65 and 70, and you have had normal Pap tests going back 10 years, you no longer need Pap tests. Even if you no longer need a Pap test, a regular exam is a good idea to make sure no other problems are starting.  If you have had past treatment for cervical cancer or a condition that could lead to cancer, you need Pap tests and screening for cancer for at least 20 years after your treatment.  If Pap tests have been discontinued, risk factors (such as a new sexual partner) need to be reassessed to determine if screening should be resumed.  The HPV test is an additional test that may be used for cervical cancer screening. The HPV test looks for the virus that can cause the cell changes on the cervix. The cells collected during the Pap test can be tested for HPV. The HPV test could be used to screen women aged 30 years and older, and should   be used in women of any age who have unclear Pap test results. After the age of 30, women should have HPV testing at the same frequency as a Pap test.  Colorectal cancer can be detected and often prevented. Most routine colorectal cancer screening begins at the age of 50 and continues through age 75. However, your caregiver may recommend screening at an earlier age if you have risk factors for colon cancer. On a yearly basis, your caregiver may provide home test kits to check for hidden blood in the stool. Use of a small camera at the end of a tube, to directly examine the colon (sigmoidoscopy or colonoscopy), can detect the earliest forms of colorectal cancer. Talk to your caregiver about this at age 50, when routine screening begins. Direct examination of the colon should be repeated every 5 to 10 years through age 75, unless early forms of pre-cancerous polyps or small growths are found.  Hepatitis C blood testing is recommended for all people born from 1945 through 1965 and any individual with known risks for hepatitis C.  Practice  safe sex. Use condoms and avoid high-risk sexual practices to reduce the spread of sexually transmitted infections (STIs). STIs include gonorrhea, chlamydia, syphilis, trichomonas, herpes, HPV, and human immunodeficiency virus (HIV). Herpes, HIV, and HPV are viral illnesses that have no cure. They can result in disability, cancer, and death. Sexually active women aged 25 and younger should be checked for chlamydia. Older women with new or multiple partners should also be tested for chlamydia. Testing for other STIs is recommended if you are sexually active and at increased risk.  Osteoporosis is a disease in which the bones lose minerals and strength with aging. This can result in serious bone fractures. The risk of osteoporosis can be identified using a bone density scan. Women ages 65 and over and women at risk for fractures or osteoporosis should discuss screening with their caregivers. Ask your caregiver whether you should take a calcium supplement or vitamin D to reduce the rate of osteoporosis.  Menopause can be associated with physical symptoms and risks. Hormone replacement therapy is available to decrease symptoms and risks. You should talk to your caregiver about whether hormone replacement therapy is right for you.  Use sunscreen with sun protection factor (SPF) of 30 or more. Apply sunscreen liberally and repeatedly throughout the day. You should seek shade when your shadow is shorter than you. Protect yourself by wearing long sleeves, pants, a wide-brimmed hat, and sunglasses year round, whenever you are outdoors.  Once a month, do a whole body skin exam, using a mirror to look at the skin on your back. Notify your caregiver of new moles, moles that have irregular borders, moles that are larger than a pencil eraser, or moles that have changed in shape or color.  Stay current with required immunizations.  Influenza. You need a dose every fall (or winter). The composition of the flu vaccine  changes each year, so being vaccinated once is not enough.  Pneumococcal polysaccharide. You need 1 to 2 doses if you smoke cigarettes or if you have certain chronic medical conditions. You need 1 dose at age 65 (or older) if you have never been vaccinated.  Tetanus, diphtheria, pertussis (Tdap, Td). Get 1 dose of Tdap vaccine if you are younger than age 65, are over 65 and have contact with an infant, are a healthcare worker, are pregnant, or simply want to be protected from whooping cough. After that, you need a Td   booster dose every 10 years. Consult your caregiver if you have not had at least 3 tetanus and diphtheria-containing shots sometime in your life or have a deep or dirty wound.  HPV. You need this vaccine if you are a woman age 26 or younger. The vaccine is given in 3 doses over 6 months.  Measles, mumps, rubella (MMR). You need at least 1 dose of MMR if you were born in 1957 or later. You may also need a second dose.  Meningococcal. If you are age 19 to 21 and a first-year college student living in a residence hall, or have one of several medical conditions, you need to get vaccinated against meningococcal disease. You may also need additional booster doses.  Zoster (shingles). If you are age 60 or older, you should get this vaccine.  Varicella (chickenpox). If you have never had chickenpox or you were vaccinated but received only 1 dose, talk to your caregiver to find out if you need this vaccine.  Hepatitis A. You need this vaccine if you have a specific risk factor for hepatitis A virus infection or you simply wish to be protected from this disease. The vaccine is usually given as 2 doses, 6 to 18 months apart.  Hepatitis B. You need this vaccine if you have a specific risk factor for hepatitis B virus infection or you simply wish to be protected from this disease. The vaccine is given in 3 doses, usually over 6 months. Preventive Services / Frequency Ages 19 to 39  Blood  pressure check.** / Every 1 to 2 years.  Lipid and cholesterol check.** / Every 5 years beginning at age 20.  Clinical breast exam.** / Every 3 years for women in their 20s and 30s.  Pap test.** / Every 2 years from ages 21 through 29. Every 3 years starting at age 30 through age 65 or 70 with a history of 3 consecutive normal Pap tests.  HPV screening.** / Every 3 years from ages 30 through ages 65 to 70 with a history of 3 consecutive normal Pap tests.  Hepatitis C blood test.** / For any individual with known risks for hepatitis C.  Skin self-exam. / Monthly.  Influenza immunization.** / Every year.  Pneumococcal polysaccharide immunization.** / 1 to 2 doses if you smoke cigarettes or if you have certain chronic medical conditions.  Tetanus, diphtheria, pertussis (Tdap, Td) immunization. / A one-time dose of Tdap vaccine. After that, you need a Td booster dose every 10 years.  HPV immunization. / 3 doses over 6 months, if you are 26 and younger.  Measles, mumps, rubella (MMR) immunization. / You need at least 1 dose of MMR if you were born in 1957 or later. You may also need a second dose.  Meningococcal immunization. / 1 dose if you are age 19 to 21 and a first-year college student living in a residence hall, or have one of several medical conditions, you need to get vaccinated against meningococcal disease. You may also need additional booster doses.  Varicella immunization.** / Consult your caregiver.  Hepatitis A immunization.** / Consult your caregiver. 2 doses, 6 to 18 months apart.  Hepatitis B immunization.** / Consult your caregiver. 3 doses usually over 6 months. Ages 40 to 64  Blood pressure check.** / Every 1 to 2 years.  Lipid and cholesterol check.** / Every 5 years beginning at age 20.  Clinical breast exam.** / Every year after age 40.  Mammogram.** / Every year beginning at age 40   and continuing for as long as you are in good health. Consult with your  caregiver.  Pap test.** / Every 3 years starting at age 30 through age 65 or 70 with a history of 3 consecutive normal Pap tests.  HPV screening.** / Every 3 years from ages 30 through ages 65 to 70 with a history of 3 consecutive normal Pap tests.  Fecal occult blood test (FOBT) of stool. / Every year beginning at age 50 and continuing until age 75. You may not need to do this test if you get a colonoscopy every 10 years.  Flexible sigmoidoscopy or colonoscopy.** / Every 5 years for a flexible sigmoidoscopy or every 10 years for a colonoscopy beginning at age 50 and continuing until age 75.  Hepatitis C blood test.** / For all people born from 1945 through 1965 and any individual with known risks for hepatitis C.  Skin self-exam. / Monthly.  Influenza immunization.** / Every year.  Pneumococcal polysaccharide immunization.** / 1 to 2 doses if you smoke cigarettes or if you have certain chronic medical conditions.  Tetanus, diphtheria, pertussis (Tdap, Td) immunization.** / A one-time dose of Tdap vaccine. After that, you need a Td booster dose every 10 years.  Measles, mumps, rubella (MMR) immunization. / You need at least 1 dose of MMR if you were born in 1957 or later. You may also need a second dose.  Varicella immunization.** / Consult your caregiver.  Meningococcal immunization.** / Consult your caregiver.  Hepatitis A immunization.** / Consult your caregiver. 2 doses, 6 to 18 months apart.  Hepatitis B immunization.** / Consult your caregiver. 3 doses, usually over 6 months. Ages 65 and over  Blood pressure check.** / Every 1 to 2 years.  Lipid and cholesterol check.** / Every 5 years beginning at age 20.  Clinical breast exam.** / Every year after age 40.  Mammogram.** / Every year beginning at age 40 and continuing for as long as you are in good health. Consult with your caregiver.  Pap test.** / Every 3 years starting at age 30 through age 65 or 70 with a 3  consecutive normal Pap tests. Testing can be stopped between 65 and 70 with 3 consecutive normal Pap tests and no abnormal Pap or HPV tests in the past 10 years.  HPV screening.** / Every 3 years from ages 30 through ages 65 or 70 with a history of 3 consecutive normal Pap tests. Testing can be stopped between 65 and 70 with 3 consecutive normal Pap tests and no abnormal Pap or HPV tests in the past 10 years.  Fecal occult blood test (FOBT) of stool. / Every year beginning at age 50 and continuing until age 75. You may not need to do this test if you get a colonoscopy every 10 years.  Flexible sigmoidoscopy or colonoscopy.** / Every 5 years for a flexible sigmoidoscopy or every 10 years for a colonoscopy beginning at age 50 and continuing until age 75.  Hepatitis C blood test.** / For all people born from 1945 through 1965 and any individual with known risks for hepatitis C.  Osteoporosis screening.** / A one-time screening for women ages 65 and over and women at risk for fractures or osteoporosis.  Skin self-exam. / Monthly.  Influenza immunization.** / Every year.  Pneumococcal polysaccharide immunization.** / 1 dose at age 65 (or older) if you have never been vaccinated.  Tetanus, diphtheria, pertussis (Tdap, Td) immunization. / A one-time dose of Tdap vaccine if you are over   65 and have contact with an infant, are a healthcare worker, or simply want to be protected from whooping cough. After that, you need a Td booster dose every 10 years.  Varicella immunization.** / Consult your caregiver.  Meningococcal immunization.** / Consult your caregiver.  Hepatitis A immunization.** / Consult your caregiver. 2 doses, 6 to 18 months apart.  Hepatitis B immunization.** / Check with your caregiver. 3 doses, usually over 6 months. ** Family history and personal history of risk and conditions may change your caregiver's recommendations. Document Released: 05/08/2001 Document Revised: 06/04/2011  Document Reviewed: 08/07/2010 [employer] Patient Information 2014 [employer].  

## 2012-09-05 NOTE — Assessment & Plan Note (Signed)
Controlled  Con't meds

## 2012-09-05 NOTE — Assessment & Plan Note (Signed)
ekg normal adipex daily  discusssed diet and  Exercise rto 1 month

## 2012-09-05 NOTE — Progress Notes (Signed)
Subjective:     Kelly [name] is a 47 y.o. female and is here for a comprehensive physical exam. The patient reports problems - pt saw gyn and vita d low-- started on 50000/ wk.  Pt is feeling tired and needs rest of labs done.   Pt also had fluid taken out of band because she was not feeling well and when she went back in her ins would not pay for fill.   Pt asking for weight loss med.  Pt also complain of boils on her buttocks -- although she does not have them now.   Pt also c/o pain R hand from pumping up bp.    History   Social History  . Marital Status: Single    Spouse Name: N/A    Number of Children: N/A  . Years of Education: N/A   Occupational History  . [employer]-- lab assistant    Social History Main Topics  . Smoking status: Never Smoker   . Smokeless tobacco: Never Used  . Alcohol Use: 4.8 oz/week    8 Glasses of wine per week  . Drug Use: No  . Sexually Active: No   Other Topics Concern  . Not on file   Social History Narrative  . No narrative on file   Health Maintenance  Topic Date Due  . Influenza Vaccine  11/24/2012  . Mammogram  08/26/2013  . Pap Smear  08/27/2015  . Tetanus/tdap  08/08/2016    The following portions of the patient's history were reviewed and updated as appropriate:  She  has a past medical history of Hypertension; Allergic rhinitis; and Anxiety. She  does not have any pertinent problems on file. She  has past surgical history that includes Laparoscopic gastric banding (05-23-09) and Liposuction. Her family history includes Alzheimer's disease in her paternal grandfather; Aneurysm (age of onset: 22) in her mother; Diabetes in her paternal aunt; Heart disease in her maternal grandfather; Heart disease (age of onset: 62) in her paternal grandmother; and Hypertension in her father, maternal grandmother, mother, and paternal grandmother. She  reports that she has never smoked. She has never used smokeless tobacco. She reports that she  drinks about 4.8 ounces of alcohol per week. She reports that she does not use illicit drugs. She has a current medication list which includes the following prescription(s): alprazolam, valacyclovir, vitamin d (ergocalciferol), bupropion, and fluoxetine. Current Outpatient Prescriptions on File Prior to Visit  Medication Sig Dispense Refill  . ALPRAZolam (XANAX) 0.25 MG tablet TAKE ONE TABLET BY MOUTH THREE TIMES DAILY AS NEEDED  30 tablet  2  . valACYclovir (VALTREX) 1000 MG tablet Take 500 mg by mouth daily.       Marland Kitchen buPROPion (WELLBUTRIN XL) 150 MG 24 hr tablet TAKE TWO TABLETS BY MOUTH IN THE MORNING  60 tablet  5  . FLUoxetine (PROZAC) 40 MG capsule TAKE ONE CAPSULE BY MOUTH EVERY DAY  30 capsule  2   No current facility-administered medications on file prior to visit.   She has No Known Allergies..  Review of Systems Review of Systems  Constitutional: Negative for activity change, appetite change and fatigue.  HENT: Negative for hearing loss, congestion, tinnitus and ear discharge.  dentist q87m Eyes: Negative for visual disturbance (see optho q1y -- vision corrected to 20/20 with glasses).  Respiratory: Negative for cough, chest tightness and shortness of breath.   Cardiovascular: Negative for chest pain, palpitations and leg swelling.  Gastrointestinal: Negative for abdominal pain, diarrhea, constipation  and abdominal distention.  Genitourinary: Negative for urgency, frequency, decreased urine volume and difficulty urinating.  Musculoskeletal: Negative for back pain, arthralgias and gait problem.  Skin: Negative for color change, pallor and rash.  Neurological: Negative for dizziness, light-headedness, and headaches.  Hematological: Negative for adenopathy. Does not bruise/bleed easily.  Psychiatric/Behavioral: Negative for suicidal ideas, confusion, sleep disturbance, self-injury, dysphoric mood, decreased concentration and agitation.      Objective:    BP 116/72  Pulse 72   Temp(Src) 98.5 F (36.9 C) (Oral)  Ht 5' 3.5" (1.613 m)  Wt 211 lb 6.4 oz (95.89 kg)  BMI 36.86 kg/m2  SpO2 97% General appearance: alert, cooperative, appears stated age and no distress Head: Normocephalic, without obvious abnormality, atraumatic Eyes: conjunctivae/corneas clear. PERRL, EOM's intact. Fundi benign. Ears: normal TM's and external ear canals both ears Nose: Nares normal. Septum midline. Mucosa normal. No drainage or sinus tenderness. Throat: lips, mucosa, and tongue normal; teeth and gums normal Neck: no adenopathy, no carotid bruit, no JVD, supple, symmetrical, trachea midline and thyroid not enlarged, symmetric, no tenderness/mass/nodules Back: symmetric, no curvature. ROM normal. No CVA tenderness. Lungs: clear to auscultation bilaterally Breasts: gyn Heart: regular rate and rhythm, S1, S2 normal, no murmur, click, rub or gallop Abdomen: soft, non-tender; bowel sounds normal; no masses,  no organomegaly Pelvic: deferred--gyn Extremities: extremities normal, atraumatic, no cyanosis or edema Pulses: 2+ and symmetric Skin: Skin color, texture, turgor normal. No rashes or lesions Lymph nodes: Cervical, supraclavicular, and axillary nodes normal. Neurologic: Alert and oriented X 3, normal strength and tone. Normal symmetric reflexes. Normal coordination and gait Psych-- no depression, no anxiety      Assessment:    Healthy female exam.      Plan:    ghm utd Check labs See After Visit Summary for Counseling Recommendations

## 2012-09-06 LAB — MICROALBUMIN / CREATININE URINE RATIO: Microalb, Ur: 0.5 mg/dL (ref 0.00–1.89)

## 2012-09-12 ENCOUNTER — Telehealth: Payer: Self-pay | Admitting: *Deleted

## 2012-09-12 MED ORDER — MELOXICAM 15 MG PO TABS: 7.5000 mg | ORAL_TABLET | ORAL | Status: DC | PRN

## 2012-09-12 NOTE — Telephone Encounter (Signed)
Detailed msg left advising Rx sent to [facility] [address] in [place].    KP

## 2012-09-12 NOTE — Telephone Encounter (Signed)
mobic 1 mg 1/2 - 1 po qd prn #30

## 2012-09-12 NOTE — Telephone Encounter (Signed)
Pt left VM that she was just seen(09-05-12) and discuss with PCP pain in her right hand. Pt is requesting a Rx for med to help with pain. Marland KitchenPlease advise [facility] [place]

## 2012-09-19 ENCOUNTER — Ambulatory Visit
Admission: RE | Admit: 2012-09-19 | Discharge: 2012-09-19 | Disposition: A | Discharge status: Home / Self Care | Payer: 59 | Source: Ambulatory Visit | Attending: Obstetrics and Gynecology | Admitting: Obstetrics and Gynecology

## 2012-09-19 DIAGNOSIS — R928 Other abnormal and inconclusive findings on diagnostic imaging of breast: Secondary | ICD-10-CM

## 2013-02-12 ENCOUNTER — Encounter: Payer: Self-pay | Admitting: Lab

## 2013-02-13 ENCOUNTER — Ambulatory Visit (INDEPENDENT_AMBULATORY_CARE_PROVIDER_SITE_OTHER): Payer: 59 | Admitting: Family Medicine

## 2013-02-13 ENCOUNTER — Encounter: Payer: Self-pay | Admitting: Family Medicine

## 2013-02-13 VITALS — BP 130/82 | HR 107 | Resp 16 | Wt 202.8 lb

## 2013-02-13 DIAGNOSIS — I10 Essential (primary) hypertension: Secondary | ICD-10-CM

## 2013-02-13 DIAGNOSIS — L0231 Cutaneous abscess of buttock: Secondary | ICD-10-CM

## 2013-02-13 MED ORDER — PHENTERMINE HCL 37.5 MG PO TABS: 37.5000 mg | ORAL_TABLET | Freq: Every day | ORAL | Status: DC

## 2013-02-13 NOTE — Patient Instructions (Signed)
Obesity Obesity is defined as having too much total body fat and a body mass index (BMI) of 30 or more. BMI is an estimate of body fat and is calculated from your height and weight. Obesity happens when you consume more calories than you can burn by exercising or performing daily physical tasks. Prolonged obesity can cause major illnesses or emergencies, such as:   A stroke.  Heart disease.  Diabetes.  Cancer.  Arthritis.  High blood pressure (hypertension).  High cholesterol.  Sleep apnea.  Erectile dysfunction.  Infertility problems. CAUSES   Regularly eating unhealthy foods.  Physical inactivity.  Certain disorders, such as an underactive thyroid (hypothyroidism), Cushing's syndrome, and polycystic ovarian syndrome.  Certain medicines, such as steroids, some depression medicines, and antipsychotics.  Genetics.  Lack of sleep. DIAGNOSIS  A caregiver can diagnose obesity after calculating your BMI. Obesity will be diagnosed if your BMI is 30 or higher.  There are other methods of measuring obesity levels. Some other methods include measuring your skin fold thickness, your waist circumference, and comparing your hip circumference to your waist circumference. TREATMENT  A healthy treatment program includes some or all of the following:  Long-term dietary changes.  Exercise and physical activity.  Behavioral and lifestyle changes.  Medicine only under the supervision of your caregiver. Medicines may help, but only if they are used with diet and exercise programs. An unhealthy treatment program includes:  Fasting.  Fad diets.  Supplements and drugs. These choices do not succeed in long-term weight control.  HOME CARE INSTRUCTIONS   Exercise and perform physical activity as directed by your caregiver. To increase physical activity, try the following:  Use stairs instead of elevators.  Park farther away from store entrances.  Garden, bike, or walk instead of  watching television or using the computer.  Eat healthy, low-calorie foods and drinks on a regular basis. Eat more fruits and vegetables. Use low-calorie cookbooks or take healthy cooking classes.  Limit fast food, sweets, and processed snack foods.  Eat smaller portions.  Keep a daily journal of everything you eat. There are many free websites to help you with this. It may be helpful to measure your foods so you can determine if you are eating the correct portion sizes.  Avoid drinking alcohol. Drink more water and drinks without calories.  Take vitamins and supplements only as recommended by your caregiver.  Weight-loss support groups, Registered Dieticians, counselors, and stress reduction education can also be very helpful. SEEK IMMEDIATE MEDICAL CARE IF:  You have chest pain or tightness.  You have trouble breathing or feel short of breath.  You have weakness or leg numbness.  You feel confused or have trouble talking.  You have sudden changes in your vision. MAKE SURE YOU:  Understand these instructions.  Will watch your condition.  Will get help right away if you are not doing well or get worse. Document Released: 04/19/2004 Document Revised: 09/11/2011 Document Reviewed: 04/18/2011 ExitCare Patient Information 2014 [employer].  

## 2013-02-13 NOTE — Progress Notes (Signed)
  Subjective:    Patient here for follow-up of elevated blood pressure.  She is exercising and is adherent to a low-salt diet.  Blood pressure is well controlled at home. Cardiac symptoms: none. Patient denies: chest pain, chest pressure/discomfort, claudication, dyspnea, exertional chest pressure/discomfort, fatigue, irregular heart beat, lower extremity edema, near-syncope, orthopnea, palpitations, paroxysmal nocturnal dyspnea, syncope and tachypnea. Cardiovascular risk factors: hypertension and obesity (BMI >= 30 kg/m2). Use of agents associated with hypertension: none. History of target organ damage: none.  The following portions of the patient's history were reviewed and updated as appropriate: allergies, current medications, past family history, past medical history, past social history, past surgical history and problem list.  Review of Systems Pertinent items are noted in HPI.     Objective:    BP 130/82  Pulse 107  Resp 16  Wt 202 lb 12.8 oz (91.989 kg)  SpO2 97% General appearance: alert, cooperative, appears stated age and no distress Lungs: clear to auscultation bilaterally Heart: S1, S2 normal Abdomen: soft, non-tender; bowel sounds normal; no masses,  no organomegaly   buttocks--  L buttock + abscess draining---culture done  Assessment:    Hypertension, normal blood pressure stable. Evidence of target organ damage: none.    Plan:    Medication: no change. Dietary sodium restriction. Regular aerobic exercise. Follow up: 1 month and as needed.

## 2013-02-13 NOTE — Progress Notes (Signed)
Pre visit review using our clinic review tool, if applicable. No additional management support is needed unless otherwise documented below in the visit note. 

## 2013-02-15 ENCOUNTER — Encounter: Payer: Self-pay | Admitting: Family Medicine

## 2013-02-15 DIAGNOSIS — L0231 Cutaneous abscess of buttock: Secondary | ICD-10-CM | POA: Insufficient documentation

## 2013-02-15 NOTE — Assessment & Plan Note (Addendum)
Culture done 

## 2013-02-15 NOTE — Assessment & Plan Note (Signed)
Restart adipex for 3 months only rto 1 month Discussed diet and exercise Pt unable to get lap band fill secondary to nausea and finances

## 2013-02-15 NOTE — Assessment & Plan Note (Signed)
stable °

## 2013-02-16 LAB — WOUND CULTURE

## 2013-02-18 ENCOUNTER — Other Ambulatory Visit: Payer: Self-pay

## 2013-02-18 MED ORDER — CIPROFLOXACIN HCL 500 MG PO TABS: 500.0000 mg | ORAL_TABLET | Freq: Two times a day (BID) | ORAL | Status: DC

## 2013-03-27 ENCOUNTER — Encounter: Payer: Self-pay | Admitting: Family Medicine

## 2013-03-27 ENCOUNTER — Ambulatory Visit (INDEPENDENT_AMBULATORY_CARE_PROVIDER_SITE_OTHER): Payer: BC Managed Care – PPO | Admitting: Family Medicine

## 2013-03-27 DIAGNOSIS — F419 Anxiety disorder, unspecified: Secondary | ICD-10-CM

## 2013-03-27 DIAGNOSIS — L0231 Cutaneous abscess of buttock: Secondary | ICD-10-CM

## 2013-03-27 DIAGNOSIS — F411 Generalized anxiety disorder: Secondary | ICD-10-CM

## 2013-03-27 DIAGNOSIS — L03317 Cellulitis of buttock: Secondary | ICD-10-CM

## 2013-03-27 MED ORDER — PHENTERMINE HCL 37.5 MG PO TABS: 37.5000 mg | ORAL_TABLET | Freq: Every day | ORAL | Status: DC

## 2013-03-27 MED ORDER — ALPRAZOLAM 0.25 MG PO TABS: ORAL_TABLET | ORAL | Status: DC

## 2013-03-27 MED ORDER — CIPROFLOXACIN HCL 500 MG PO TABS: 500.0000 mg | ORAL_TABLET | Freq: Two times a day (BID) | ORAL | Status: DC

## 2013-03-27 NOTE — Assessment & Plan Note (Signed)
cipro refilled Consider surgeon if worsens

## 2013-03-27 NOTE — Progress Notes (Signed)
   Subjective:    Patient ID: Kelly [name], female    DOB: 09/06/1965, 48 y.o.   MRN: [id number]  HPI Pt here to f/u weight loss and also is requesting a refill on cipro because abscess is smaller but not gone.  No other complaints.     Review of Systems As above    Objective:   Physical Exam BP 136/80  Pulse 106  Temp(Src) 98.2 F (36.8 C) (Oral)  Wt 197 lb 6.4 oz (89.54 kg)  SpO2 99% General appearance: alert, cooperative, appears stated age and no distress Neck: no adenopathy, supple, symmetrical, trachea midline and thyroid not enlarged, symmetric, no tenderness/mass/nodules Lungs: clear to auscultation bilaterally Heart: S1, S2 normal Skin: + smal boil palpated under skin, no drainage, no tenderness--- L buttock       Assessment & Plan:

## 2013-03-27 NOTE — Patient Instructions (Signed)

## 2013-07-03 ENCOUNTER — Ambulatory Visit: Payer: BC Managed Care – PPO | Admitting: Family Medicine

## 2013-07-09 ENCOUNTER — Encounter: Payer: Self-pay | Admitting: Lab

## 2013-07-09 ENCOUNTER — Ambulatory Visit (INDEPENDENT_AMBULATORY_CARE_PROVIDER_SITE_OTHER): Payer: BC Managed Care – PPO | Admitting: Family Medicine

## 2013-07-09 ENCOUNTER — Encounter: Payer: Self-pay | Admitting: Family Medicine

## 2013-07-09 VITALS — BP 120/70 | HR 78 | Temp 98.2°F | Wt 201.0 lb

## 2013-07-09 DIAGNOSIS — R002 Palpitations: Secondary | ICD-10-CM

## 2013-07-09 DIAGNOSIS — F411 Generalized anxiety disorder: Secondary | ICD-10-CM

## 2013-07-09 DIAGNOSIS — E049 Nontoxic goiter, unspecified: Secondary | ICD-10-CM

## 2013-07-09 DIAGNOSIS — F419 Anxiety disorder, unspecified: Secondary | ICD-10-CM

## 2013-07-09 MED ORDER — ALPRAZOLAM 0.25 MG PO TABS: ORAL_TABLET | ORAL | Status: DC

## 2013-07-09 NOTE — Addendum Note (Signed)
Addended by: Ewing Schlein on: 07/09/2013 03:48 PM   Modules accepted: Orders

## 2013-07-09 NOTE — Progress Notes (Signed)
Pre visit review using our clinic review tool, if applicable. No additional management support is needed unless otherwise documented below in the visit note. 

## 2013-07-09 NOTE — Progress Notes (Signed)
  Subjective:    Kelly [name] is a 48 y.o. female who presents with palpitations. The symptoms are moderate, occur intermittently, and last 1 hour  - per episode. They tend to occur anytime. Cardiac risk factors include: sedentary lifestyle and hx thyroid problems in family. Aggravating factors: none. Relieving factors: none. Associated symptoms: palpitations and rapid heart beat. Patient denies: abdominal pain, calf pain, chest pain, dizziness, leg swelling and shortness of breath.  They have woken her up at night as well.    The following portions of the patient's history were reviewed and updated as appropriate:  She  has a past medical history of Hypertension; Allergic rhinitis; and Anxiety. She  does not have any pertinent problems on file. She  has past surgical history that includes Laparoscopic gastric banding (05-23-09) and Liposuction. Her family history includes Alzheimer's disease in her paternal grandfather; Aneurysm (age of onset: 40) in her mother; Diabetes in her paternal aunt; Heart disease in her maternal grandfather; Heart disease (age of onset: 101) in her paternal grandmother; Hypertension in her father, maternal grandmother, mother, and paternal grandmother. She  reports that she has never smoked. She has never used smokeless tobacco. She reports that she drinks about 4.8 ounces of alcohol per week. She reports that she does not use illicit drugs. She has a current medication list which includes the following prescription(s): alprazolam and valacyclovir. Current Outpatient Prescriptions on File Prior to Visit  Medication Sig Dispense Refill  . valACYclovir (VALTREX) 1000 MG tablet Take 500 mg by mouth daily.        No current facility-administered medications on file prior to visit.   She has No Known Allergies..  Review of Systems Pertinent items are noted in HPI.   Objective:    BP 120/70  Pulse 78  Temp(Src) 98.2 F (36.8 C) (Oral)  Wt 201 lb (91.173 kg)  SpO2  97% General appearance: alert, cooperative, appears stated age and no distress Throat: lips, mucosa, and tongue normal; teeth and gums normal Neck: no adenopathy, no carotid bruit, no JVD, supple, symmetrical, trachea midline and thyroid: enlarged Lungs: clear to auscultation bilaterally Heart: S1, S2 normal + murmur Extremities: extremities normal, atraumatic, no cyanosis or edema  Cardiographics ECG: normal sinus rhythm   Assessment:    Palpitations   Plan:     1. Anxiety stable  2. Palpitations Check labs,  See orders - EKG [id number] - Basic metabolic panel - CBC with Differential - Hepatic function panel - Vitamin B12 - TSH - T3, free - T4, free - Cardiac event monitor; Future - 2D Echocardiogram without contrast; Future  3. Goiter Check labs and [place] - US Soft Tissue Head/Neck; Future .

## 2013-07-09 NOTE — Patient Instructions (Signed)
Goiter Goiter is an enlarged thyroid gland. The thyroid gland sits at the base of the front of the neck. The gland produces hormones that regulate mood, body temperature, pulse rate, and digestion. Most goiters are painless and are not a cause for serious concern. Goiters and conditions that cause goiters can be treated if necessary.  CAUSES  Common causes of goiter include:  Graves disease (causes too much hormone to be produced [hyperthyroidism]).  Hashimoto's disease (causes too little hormone to be produced [hypothyroidism]).  Thyroiditis (inflammation of the thyroid sometimes caused by virus or pregnancy).  Nodular goiter (small bumps form; sometimes called toxic nodular goiter).  Pregnancy.  Thyroid cancer (very few goiters with nodules are cancerous).  Certain medications.  Radiation exposure.  Iodine deficiency (more common in developing countries in inland populations). RISK FACTORS Risk factors for goiter include:  A family history of goiter.  Female gender.  Inadequate iodine in the diet.  Age older than 40 years. SYMPTOMS  Many goiters do not cause symptoms. When symptoms do occur, they may include:  Swelling in the lower part of the neck. This swelling can range from a very small bump to a large lump.  A tight feeling in the throat.  A hoarse voice. Less commonly, a goiter may result in:  Coughing.  Wheezing.  Difficulty swallowing.  Difficulty breathing.  Bulging neck veins.  Dizziness. When a goiter is the result of hyperthyroidism, symptoms may include:  Rapid or irregular heart beat.  Sicknessin your stomach (nausea).  Vomiting.  Diarrhea.  Shaking.  Irritable feeling.  Bulging eyes.  Weight loss.  Heat sensitivity.  Anxiety. When a goiter is the result of hypothyroidism, symptoms may include:  Tiredness.  Dry skin.  Constipation.  Weight gain.  Irregular menstrual cycle.  Depressed mood.  Sensitivity to  cold. DIAGNOSIS  Tests used to diagnose goiter include:  A physical exam.  Blood tests, including thyroid hormone levels and antibody testing.  Ultrasonography, computerized X-ray scan (computed tomography, CT) or computerized magnetic scan (magnetic resonance imaging, MRI).  Thyroid scan (imaging along with safe radioactive injection).  Tissue sample taken (biopsy) of nodules. This is sometimes done to confirm that the nodules are not cancerous. TREATMENT  Treatment will depend on the cause of the goiter. Treatment may include:  Monitoring. In some cases, no treatment is necessary, and your doctor will monitor yourcondition at regular check ups.  Medications and supplements. Thyroid medication (thyroid hormone replacement) is available for hyperthroidism and hypothyroidism.  If inflammation is the cause, over-the-counter medication or steroid medication may be recommended.  Goiters caused by iodine deficiency can be treated with iodine supplements or changes in diet.  Radioactive iodine treatment. Radioactive iodine is injected into the blood. It travels to the thyroid gland, kills thyroid cells, and reduces the size of the gland. This is only used when the thyroid gland is overactive. Lifelong thyroid hormone medication is often necessary after this treatment.  Surgery. A procedure to remove all or part of the gland may be recommended in severe cases or when cancer is the cause. Hormones can be taken to replace the hormones normally produced by the thyroid. HOME CARE INSTRUCTIONS   Take medications as directed.  Follow your caregiver's recommendations for any dietary changes.  Follow up with your caregiver for further examination and testing, as directed. PREVENTION   If you have a family history of goiter, discuss screening with your doctor.  Make sure you are getting enough iodine in your diet.  Use   of iodized table salt can help prevent iodine deficiency. Document  Released: 08/30/2009 Document Revised: 06/04/2011 Document Reviewed: 08/30/2009 ExitCare Patient Information 2014 [employer].  

## 2013-07-10 LAB — CBC WITH DIFFERENTIAL/PLATELET
Basophils Absolute: 0.1 10*3/uL (ref 0.0–0.1)
Basophils Relative: 0.7 % (ref 0.0–3.0)
EOS ABS: 0.6 10*3/uL (ref 0.0–0.7)
EOS PCT: 6.6 % — AB (ref 0.0–5.0)
HCT: 40.9 % (ref 36.0–46.0)
Hemoglobin: 13.7 g/dL (ref 12.0–15.0)
Lymphocytes Relative: 31.9 % (ref 12.0–46.0)
Lymphs Abs: 2.8 10*3/uL (ref 0.7–4.0)
MCHC: 33.4 g/dL (ref 30.0–36.0)
MCV: 94 fl (ref 78.0–100.0)
MONO ABS: 0.3 10*3/uL (ref 0.1–1.0)
Monocytes Relative: 4 % (ref 3.0–12.0)
NEUTROS PCT: 56.8 % (ref 43.0–77.0)
Neutro Abs: 4.9 10*3/uL (ref 1.4–7.7)
PLATELETS: 351 10*3/uL (ref 150.0–400.0)
RBC: 4.35 Mil/uL (ref 3.87–5.11)
RDW: 12.6 % (ref 11.5–14.6)
WBC: 8.7 10*3/uL (ref 4.5–10.5)

## 2013-07-10 LAB — BASIC METABOLIC PANEL
BUN: 10 mg/dL (ref 6–23)
CALCIUM: 9 mg/dL (ref 8.4–10.5)
CHLORIDE: 104 meq/L (ref 96–112)
CO2: 24 mEq/L (ref 19–32)
CREATININE: 0.8 mg/dL (ref 0.4–1.2)
GFR: 92.79 mL/min (ref >60.00)
Glucose, Bld: 71 mg/dL (ref 70–99)
Potassium: 3.8 mEq/L (ref 3.5–5.1)
Sodium: 136 mEq/L (ref 135–145)

## 2013-07-10 LAB — HEPATIC FUNCTION PANEL
ALT: 12 U/L (ref 0–35)
AST: 18 U/L (ref 0–37)
Albumin: 3.8 g/dL (ref 3.5–5.2)
Alkaline Phosphatase: 55 U/L (ref 39–117)
BILIRUBIN DIRECT: 0 mg/dL (ref 0.0–0.3)
BILIRUBIN TOTAL: 1.1 mg/dL (ref 0.3–1.2)
Total Protein: 7.6 g/dL (ref 6.0–8.3)

## 2013-07-10 LAB — T4, FREE: Free T4: 0.96 ng/dL (ref 0.60–1.60)

## 2013-07-10 LAB — VITAMIN B12: Vitamin B-12: 252 pg/mL (ref 211–911)

## 2013-07-10 LAB — T3, FREE: T3 FREE: 2.4 pg/mL (ref 2.3–4.2)

## 2013-07-10 LAB — TSH: TSH: 1.83 u[IU]/mL (ref 0.35–5.50)

## 2013-07-13 ENCOUNTER — Telehealth: Payer: Self-pay | Admitting: Family Medicine

## 2013-07-13 ENCOUNTER — Encounter: Payer: Self-pay | Admitting: Family Medicine

## 2013-07-13 NOTE — Telephone Encounter (Signed)
Patient is requesting last lab results 

## 2013-07-13 NOTE — Telephone Encounter (Signed)
Low normal b12--- she may benefit from b12 injections weekly x4 then monthly--- recheck 1 months   MSG left to call the office     KP

## 2013-07-13 NOTE — Telephone Encounter (Signed)
Patient returned phone call. Best # [phone]

## 2013-07-13 NOTE — Telephone Encounter (Signed)
Detailed MSG left advising of the below recommendations and to call the office if she would like to schedule.    KP

## 2013-07-14 ENCOUNTER — Telehealth: Payer: Self-pay | Admitting: Family Medicine

## 2013-07-14 ENCOUNTER — Other Ambulatory Visit: Payer: Self-pay | Admitting: Family Medicine

## 2013-07-14 DIAGNOSIS — E049 Nontoxic goiter, unspecified: Secondary | ICD-10-CM

## 2013-07-14 DIAGNOSIS — F411 Generalized anxiety disorder: Secondary | ICD-10-CM

## 2013-07-14 NOTE — Telephone Encounter (Signed)
Patient called and stated that she wanted to add a lab on to her visit last week. She would like to add TPO antibodies. Please advise

## 2013-07-14 NOTE — Telephone Encounter (Signed)
Order in.

## 2013-07-14 NOTE — Telephone Encounter (Signed)
Please advise      KP 

## 2013-07-15 ENCOUNTER — Encounter: Payer: Self-pay | Admitting: *Deleted

## 2013-07-15 ENCOUNTER — Other Ambulatory Visit: Payer: BC Managed Care – PPO

## 2013-07-15 ENCOUNTER — Ambulatory Visit
Admission: RE | Admit: 2013-07-15 | Discharge: 2013-07-15 | Disposition: A | Discharge status: Home / Self Care | Payer: BC Managed Care – PPO | Source: Ambulatory Visit | Attending: Family Medicine | Admitting: Family Medicine

## 2013-07-15 ENCOUNTER — Encounter (INDEPENDENT_AMBULATORY_CARE_PROVIDER_SITE_OTHER): Payer: BC Managed Care – PPO

## 2013-07-15 ENCOUNTER — Other Ambulatory Visit (INDEPENDENT_AMBULATORY_CARE_PROVIDER_SITE_OTHER): Payer: BC Managed Care – PPO

## 2013-07-15 DIAGNOSIS — R002 Palpitations: Secondary | ICD-10-CM

## 2013-07-15 DIAGNOSIS — E049 Nontoxic goiter, unspecified: Secondary | ICD-10-CM

## 2013-07-15 DIAGNOSIS — F411 Generalized anxiety disorder: Secondary | ICD-10-CM

## 2013-07-15 NOTE — Telephone Encounter (Signed)
Error//AB/CMA 

## 2013-07-15 NOTE — Progress Notes (Signed)
Patient ID: Kelly [name], female   DOB: 07/22/1965, 48 y.o.   MRN: [id number] E-Cardio verite 30 day cardiac event monitor applied to patient.

## 2013-07-15 NOTE — Telephone Encounter (Signed)
The lab has been notified to add on this test.      KP

## 2013-07-16 LAB — THYROID PEROXIDASE ANTIBODY

## 2013-07-17 ENCOUNTER — Ambulatory Visit (INDEPENDENT_AMBULATORY_CARE_PROVIDER_SITE_OTHER): Payer: BC Managed Care – PPO

## 2013-07-17 ENCOUNTER — Other Ambulatory Visit: Payer: Self-pay

## 2013-07-17 DIAGNOSIS — R5381 Other malaise: Secondary | ICD-10-CM

## 2013-07-17 DIAGNOSIS — E010 Iodine-deficiency related diffuse (endemic) goiter: Secondary | ICD-10-CM

## 2013-07-17 DIAGNOSIS — E041 Nontoxic single thyroid nodule: Secondary | ICD-10-CM

## 2013-07-17 DIAGNOSIS — R5383 Other fatigue: Secondary | ICD-10-CM

## 2013-07-17 MED ORDER — CYANOCOBALAMIN 1000 MCG/ML IJ SOLN
1000.0000 ug | Freq: Once | INTRAMUSCULAR | Status: AC
  Administered 2013-07-17: 1000 ug via INTRAMUSCULAR

## 2013-07-20 ENCOUNTER — Other Ambulatory Visit: Payer: Self-pay

## 2013-07-20 DIAGNOSIS — E041 Nontoxic single thyroid nodule: Secondary | ICD-10-CM

## 2013-07-24 ENCOUNTER — Ambulatory Visit (INDEPENDENT_AMBULATORY_CARE_PROVIDER_SITE_OTHER): Payer: BC Managed Care – PPO

## 2013-07-24 ENCOUNTER — Ambulatory Visit (HOSPITAL_COMMUNITY): Payer: BC Managed Care – PPO | Attending: Internal Medicine | Admitting: Cardiology

## 2013-07-24 DIAGNOSIS — Z6834 Body mass index (BMI) 34.0-34.9, adult: Secondary | ICD-10-CM | POA: Insufficient documentation

## 2013-07-24 DIAGNOSIS — E669 Obesity, unspecified: Secondary | ICD-10-CM | POA: Insufficient documentation

## 2013-07-24 DIAGNOSIS — R002 Palpitations: Secondary | ICD-10-CM | POA: Insufficient documentation

## 2013-07-24 DIAGNOSIS — E538 Deficiency of other specified B group vitamins: Secondary | ICD-10-CM

## 2013-07-24 MED ORDER — CYANOCOBALAMIN 1000 MCG/ML IJ SOLN
1000.0000 ug | Freq: Once | INTRAMUSCULAR | Status: AC
  Administered 2013-07-24: 1000 ug via INTRAMUSCULAR

## 2013-07-24 NOTE — Progress Notes (Signed)
Echo performed. 

## 2013-07-29 ENCOUNTER — Telehealth: Payer: Self-pay | Admitting: Family Medicine

## 2013-07-29 ENCOUNTER — Ambulatory Visit
Admission: RE | Admit: 2013-07-29 | Discharge: 2013-07-29 | Disposition: A | Discharge status: Home / Self Care | Payer: BC Managed Care – PPO | Source: Ambulatory Visit | Attending: Family Medicine | Admitting: Family Medicine

## 2013-07-29 ENCOUNTER — Other Ambulatory Visit (HOSPITAL_COMMUNITY)
Admission: RE | Admit: 2013-07-29 | Discharge: 2013-07-29 | Disposition: A | Discharge status: Home / Self Care | Payer: BC Managed Care – PPO | Source: Ambulatory Visit | Attending: Family Medicine | Admitting: Family Medicine

## 2013-07-29 DIAGNOSIS — E041 Nontoxic single thyroid nodule: Secondary | ICD-10-CM | POA: Insufficient documentation

## 2013-07-29 NOTE — Telephone Encounter (Signed)
Caller name: Latanya Relation to pt: Call back number: [phone]   Reason for call:  Pt returned call to [facility]

## 2013-07-29 NOTE — Telephone Encounter (Signed)
Patient has been made aware and voiced understanding.     KP 

## 2013-07-29 NOTE — Telephone Encounter (Signed)
Mild murmur only--- otherwise normal

## 2013-07-31 ENCOUNTER — Ambulatory Visit (INDEPENDENT_AMBULATORY_CARE_PROVIDER_SITE_OTHER): Payer: BC Managed Care – PPO | Admitting: *Deleted

## 2013-07-31 ENCOUNTER — Ambulatory Visit: Payer: BC Managed Care – PPO

## 2013-07-31 DIAGNOSIS — E538 Deficiency of other specified B group vitamins: Secondary | ICD-10-CM

## 2013-07-31 MED ORDER — CYANOCOBALAMIN 1000 MCG/ML IJ SOLN
1000.0000 ug | Freq: Once | INTRAMUSCULAR | Status: AC
  Administered 2013-07-31: 1000 ug via INTRAMUSCULAR

## 2013-08-07 ENCOUNTER — Ambulatory Visit (INDEPENDENT_AMBULATORY_CARE_PROVIDER_SITE_OTHER): Payer: BC Managed Care – PPO | Admitting: *Deleted

## 2013-08-07 DIAGNOSIS — E538 Deficiency of other specified B group vitamins: Secondary | ICD-10-CM

## 2013-08-07 MED ORDER — CYANOCOBALAMIN 1000 MCG/ML IJ SOLN
1000.0000 ug | Freq: Once | INTRAMUSCULAR | Status: AC
  Administered 2013-08-07: 1000 ug via INTRAMUSCULAR

## 2013-08-10 ENCOUNTER — Encounter: Payer: Self-pay | Admitting: Family Medicine

## 2013-08-14 ENCOUNTER — Encounter: Payer: Self-pay | Admitting: Family Medicine

## 2013-08-14 ENCOUNTER — Ambulatory Visit (INDEPENDENT_AMBULATORY_CARE_PROVIDER_SITE_OTHER): Payer: BC Managed Care – PPO | Admitting: Family Medicine

## 2013-08-14 VITALS — BP 130/60 | HR 75 | Temp 97.8°F | Wt 201.0 lb

## 2013-08-14 DIAGNOSIS — E049 Nontoxic goiter, unspecified: Secondary | ICD-10-CM

## 2013-08-14 MED ORDER — LEVOTHYROXINE SODIUM 50 MCG PO TABS: 50.0000 ug | ORAL_TABLET | Freq: Every day | ORAL | Status: DC

## 2013-08-14 NOTE — Progress Notes (Signed)
Pre visit review using our clinic review tool, if applicable. No additional management support is needed unless otherwise documented below in the visit note. 

## 2013-08-14 NOTE — Patient Instructions (Signed)
Goiter Goiter is an enlarged thyroid gland. The thyroid gland sits at the base of the front of the neck. The gland produces hormones that regulate mood, body temperature, pulse rate, and digestion. Most goiters are painless and are not a cause for serious concern. Goiters and conditions that cause goiters can be treated if necessary.  CAUSES  Common causes of goiter include:  Graves disease (causes too much hormone to be produced [hyperthyroidism]).  Hashimoto's disease (causes too little hormone to be produced [hypothyroidism]).  Thyroiditis (inflammation of the thyroid sometimes caused by virus or pregnancy).  Nodular goiter (small bumps form; sometimes called toxic nodular goiter).  Pregnancy.  Thyroid cancer (very few goiters with nodules are cancerous).  Certain medications.  Radiation exposure.  Iodine deficiency (more common in developing countries in inland populations). RISK FACTORS Risk factors for goiter include:  A family history of goiter.  Female gender.  Inadequate iodine in the diet.  Age older than 40 years. SYMPTOMS  Many goiters do not cause symptoms. When symptoms do occur, they may include:  Swelling in the lower part of the neck. This swelling can range from a very small bump to a large lump.  A tight feeling in the throat.  A hoarse voice. Less commonly, a goiter may result in:  Coughing.  Wheezing.  Difficulty swallowing.  Difficulty breathing.  Bulging neck veins.  Dizziness. When a goiter is the result of hyperthyroidism, symptoms may include:  Rapid or irregular heart beat.  Sicknessin your stomach (nausea).  Vomiting.  Diarrhea.  Shaking.  Irritable feeling.  Bulging eyes.  Weight loss.  Heat sensitivity.  Anxiety. When a goiter is the result of hypothyroidism, symptoms may include:  Tiredness.  Dry skin.  Constipation.  Weight gain.  Irregular menstrual cycle.  Depressed mood.  Sensitivity to  cold. DIAGNOSIS  Tests used to diagnose goiter include:  A physical exam.  Blood tests, including thyroid hormone levels and antibody testing.  Ultrasonography, computerized X-ray scan (computed tomography, CT) or computerized magnetic scan (magnetic resonance imaging, MRI).  Thyroid scan (imaging along with safe radioactive injection).  Tissue sample taken (biopsy) of nodules. This is sometimes done to confirm that the nodules are not cancerous. TREATMENT  Treatment will depend on the cause of the goiter. Treatment may include:  Monitoring. In some cases, no treatment is necessary, and your doctor will monitor yourcondition at regular check ups.  Medications and supplements. Thyroid medication (thyroid hormone replacement) is available for hyperthroidism and hypothyroidism.  If inflammation is the cause, over-the-counter medication or steroid medication may be recommended.  Goiters caused by iodine deficiency can be treated with iodine supplements or changes in diet.  Radioactive iodine treatment. Radioactive iodine is injected into the blood. It travels to the thyroid gland, kills thyroid cells, and reduces the size of the gland. This is only used when the thyroid gland is overactive. Lifelong thyroid hormone medication is often necessary after this treatment.  Surgery. A procedure to remove all or part of the gland may be recommended in severe cases or when cancer is the cause. Hormones can be taken to replace the hormones normally produced by the thyroid. HOME CARE INSTRUCTIONS   Take medications as directed.  Follow your caregiver's recommendations for any dietary changes.  Follow up with your caregiver for further examination and testing, as directed. PREVENTION   If you have a family history of goiter, discuss screening with your doctor.  Make sure you are getting enough iodine in your diet.  Use   of iodized table salt can help prevent iodine deficiency. Document  Released: 08/30/2009 Document Revised: 06/04/2011 Document Reviewed: 08/30/2009 ExitCare Patient Information 2014 [employer].  

## 2013-08-15 ENCOUNTER — Encounter: Payer: Self-pay | Admitting: Family Medicine

## 2013-08-15 NOTE — Progress Notes (Signed)
   Subjective:    Patient ID: Kelly [name], female    DOB: 10/14/1965, 48 y.o.   MRN: [id number]  HPI Pt here to go over thyroid biopsy results.  She is requesting to see Dr Doristine Johns for f/u   Review of Systems As above    Objective:   Physical Exam  BP 130/60  Pulse 75  Temp(Src) 97.8 F (36.6 C) (Oral)  Wt 201 lb (91.173 kg)  SpO2 95%  LMP 07/24/2013 General appearance: alert, cooperative, appears stated age and no distress Throat: lips, mucosa, and tongue normal; teeth and gums normal Neck: no adenopathy and thyroid: enlarged Lungs: clear to auscultation bilaterally Heart: S1, S2 normal      Assessment & Plan:  1. Goiter Refer to endo -- pt requesting alheimer - Ambulatory referral to Endocrinology - levothyroxine (SYNTHROID, LEVOTHROID) 50 MCG tablet; Take 1 tablet (50 mcg total) by mouth daily.  Dispense: 90 tablet; Refill: 3 Pt works with Dr Chalmers Cater but she and the pt prefer she goes to another provider for treatment---- she did recommend starting 50 mcg synthroid

## 2013-08-31 ENCOUNTER — Telehealth: Payer: Self-pay | Admitting: Family Medicine

## 2013-08-31 DIAGNOSIS — E538 Deficiency of other specified B group vitamins: Secondary | ICD-10-CM

## 2013-08-31 NOTE — Telephone Encounter (Signed)
Patient would like to know the results of the holter monitor she wore in May. She would also like to know if she needs to come in for additional labs for her b12 level.

## 2013-08-31 NOTE — Telephone Encounter (Signed)
Left a message for call back.  

## 2013-09-01 NOTE — Telephone Encounter (Signed)
Pt was encouraged to call cardiology for the results from her holter monitor.  She was also informed that Dr. Etter Sjogren wants her to have her vitamin B-12 rechecked per her lab note on 07/12/13.   Lab appointment and nurse visit for next vitamin B-12 injection was scheduled on 09/11/13 @ 3:15 pm per patient's request.

## 2013-09-01 NOTE — Telephone Encounter (Signed)
Left a message for call back.  

## 2013-09-11 ENCOUNTER — Other Ambulatory Visit: Payer: BC Managed Care – PPO

## 2013-09-11 ENCOUNTER — Ambulatory Visit (INDEPENDENT_AMBULATORY_CARE_PROVIDER_SITE_OTHER): Payer: BC Managed Care – PPO

## 2013-09-11 DIAGNOSIS — E538 Deficiency of other specified B group vitamins: Secondary | ICD-10-CM

## 2013-09-11 MED ORDER — CYANOCOBALAMIN 1000 MCG/ML IJ SOLN
1000.0000 ug | Freq: Once | INTRAMUSCULAR | Status: AC
  Administered 2013-09-11: 1000 ug via INTRAMUSCULAR

## 2013-09-12 LAB — VITAMIN B12: VITAMIN B 12: 1173 pg/mL — AB (ref 211–911)

## 2013-10-09 ENCOUNTER — Ambulatory Visit (INDEPENDENT_AMBULATORY_CARE_PROVIDER_SITE_OTHER): Payer: BC Managed Care – PPO | Admitting: Medical

## 2013-10-09 ENCOUNTER — Ambulatory Visit: Payer: BC Managed Care – PPO | Admitting: Family Medicine

## 2013-10-09 ENCOUNTER — Ambulatory Visit (HOSPITAL_BASED_OUTPATIENT_CLINIC_OR_DEPARTMENT_OTHER)
Admission: RE | Admit: 2013-10-09 | Discharge: 2013-10-09 | Disposition: A | Discharge status: Home / Self Care | Payer: BC Managed Care – PPO | Source: Ambulatory Visit | Attending: Medical | Admitting: Medical

## 2013-10-09 ENCOUNTER — Encounter: Payer: Self-pay | Admitting: Medical

## 2013-10-09 ENCOUNTER — Ambulatory Visit (HOSPITAL_BASED_OUTPATIENT_CLINIC_OR_DEPARTMENT_OTHER): Admission: RE | Admit: 2013-10-09 | Payer: BC Managed Care – PPO | Source: Ambulatory Visit

## 2013-10-09 ENCOUNTER — Other Ambulatory Visit: Payer: Self-pay | Admitting: Medical

## 2013-10-09 VITALS — BP 126/76 | HR 74 | Temp 98.4°F | Resp 16 | Ht 64.0 in | Wt 197.0 lb

## 2013-10-09 DIAGNOSIS — M25562 Pain in left knee: Secondary | ICD-10-CM

## 2013-10-09 DIAGNOSIS — R52 Pain, unspecified: Secondary | ICD-10-CM

## 2013-10-09 DIAGNOSIS — M25569 Pain in unspecified knee: Secondary | ICD-10-CM | POA: Insufficient documentation

## 2013-10-09 DIAGNOSIS — M79609 Pain in unspecified limb: Secondary | ICD-10-CM

## 2013-10-09 DIAGNOSIS — R609 Edema, unspecified: Secondary | ICD-10-CM | POA: Insufficient documentation

## 2013-10-09 MED ORDER — TRAMADOL HCL 50 MG PO TABS: 50.0000 mg | ORAL_TABLET | Freq: Four times a day (QID) | ORAL | Status: DC | PRN

## 2013-10-09 MED ORDER — DICLOFENAC SODIUM 75 MG PO TBEC: 75.0000 mg | DELAYED_RELEASE_TABLET | Freq: Two times a day (BID) | ORAL | Status: DC

## 2013-10-09 NOTE — Assessment & Plan Note (Signed)
Follow dopplers today and see if has bakers cyst as well. Awaiting call back on stat dopplers.

## 2013-10-09 NOTE — Assessment & Plan Note (Signed)
Diclofenac rx and tramadol. Await xray. If further artralgias in future consider arthritis panel.

## 2013-10-09 NOTE — Patient Instructions (Addendum)
Please get both xray and dopplers today. Stay there until I am called on stat results. Further tx will depend on results. Will discuss results with her when I am called.   I did get call later on her doppler results and verbal report negative for dvt and baker cyst. Advised and discussed result with pt.

## 2013-10-09 NOTE — Progress Notes (Signed)
   Subjective:    Patient ID: Kelly [name], female    DOB: 1966/01/31, 48 y.o.   MRN: [id number]  HPI  Pt in with lower legs dull aches. She notices it all the time. Notes pain is more at night. Pt tried dad hydrocodone occasionally and it does help.  Also takes ibuprofen 800 mg. But this did not help much. Knees do feel worse.  Achiness in legs for 2 months.   No   Also one month of some pain in upper lt back calf. Pt thinks she feels little buldge on lt side. No sob. No hx of dvt. Some rt popliteal pain as well.  Pt does report occasionally some burning sensation in her toes. On both sides that has been going on for about 2 wks. Pt did give b12 injections but her her last level.  Pt has known hx of arthritis.    Review of Systems  Constitutional: Negative for fever, chills and fatigue.  Respiratory: Negative for cough, choking, shortness of breath and wheezing.   Cardiovascular: Negative for chest pain and palpitations.  Musculoskeletal: Positive for arthralgias.       See hop  Skin: Negative.   Neurological: Negative.        Only buring to toe history. Low b-12 history in the past. 3rd problem reported today.       Objective:   Physical Exam General nad. Pleasant.  Lungs- clear even and unlabored.  Heart- RRR  Rt knee- mild crepitus no pain on rom. No intablility. No pain on palpation.  Lt knee- mild- moderate crepitus. Pain on palpation under patella over patella tendon and mild pain over lateral region of tibial plateau.   Lt lower ext- symmetric compared to rt side. Mild positve homans sign. No edema. No warmth. None tender lower ext. Popliteal fossa does have moderate bulge. dorsalis pedis and posterior tib pulse intact.  Rt lower ext- symmetric to lt side. Faint positive homans sign. No edema, no warmth, and nontender. No palpable bulge that I can appreciate in popliteal fossa. Posterior tibial and dorsalis pedis pulse intact.         Assessment & Plan:    1. Regarding pt burning will defer that for other visit with me or pcp. Could get b12 again and add folate as well as rpr. Also  Could get bmp/check bs. Today prioritized dopplers.  2. Also instructed stopping ibuprofen while on diclofenac. Advised don't take dad medicines either for her pain.

## 2013-10-09 NOTE — Progress Notes (Signed)
Pre visit review using our clinic review tool, if applicable. No additional management support is needed unless otherwise documented below in the visit note. 

## 2013-10-13 ENCOUNTER — Telehealth: Payer: Self-pay

## 2013-10-13 NOTE — Telephone Encounter (Signed)
Pt was contacted and VM left stating US stated mild changes in knee.Also if pain worsens Pt could be referred to Ortho for further Eval. Pt was asked to contact office if any other questions.

## 2013-10-16 ENCOUNTER — Ambulatory Visit: Payer: BC Managed Care – PPO | Admitting: Family Medicine

## 2013-10-27 ENCOUNTER — Telehealth: Payer: Self-pay | Admitting: Family Medicine

## 2013-10-27 MED ORDER — DICLOFENAC SODIUM 75 MG PO TBEC: 75.0000 mg | DELAYED_RELEASE_TABLET | Freq: Two times a day (BID) | ORAL | Status: DC

## 2013-10-27 MED ORDER — TRAMADOL HCL 50 MG PO TABS: 50.0000 mg | ORAL_TABLET | Freq: Four times a day (QID) | ORAL | Status: DC | PRN

## 2013-10-27 NOTE — Telephone Encounter (Signed)
Last seen 10/09/13 with Kelly [name] for knee pain. Both med's filled 10/09/13 Tramadol #16 and Voltaren #30. Please advise     KP

## 2013-10-27 NOTE — Telephone Encounter (Addendum)
Detailed message left advising Rx faxed and if referral needed to call the office       KP

## 2013-10-27 NOTE — Telephone Encounter (Signed)
Caller name: Lekita Relation to pt: Call back number:[phone] at work till 4 or [phone] Pharmacy: [facility] in [facility]  Reason for call: pt need re-fills on the following: traMADol (ULTRAM) 50 MG tablet  diclofenac (VOLTAREN) 75 MG EC tablet

## 2013-10-27 NOTE — Telephone Encounter (Signed)
Ok to fill both x 1 Does pt need ortho referral?

## 2013-11-13 ENCOUNTER — Other Ambulatory Visit: Payer: Self-pay | Admitting: Obstetrics and Gynecology

## 2013-11-13 DIAGNOSIS — R928 Other abnormal and inconclusive findings on diagnostic imaging of breast: Secondary | ICD-10-CM

## 2013-11-27 ENCOUNTER — Ambulatory Visit
Admission: RE | Admit: 2013-11-27 | Discharge: 2013-11-27 | Disposition: A | Discharge status: Home / Self Care | Payer: BC Managed Care – PPO | Source: Ambulatory Visit | Attending: Obstetrics and Gynecology | Admitting: Obstetrics and Gynecology

## 2013-11-27 DIAGNOSIS — R928 Other abnormal and inconclusive findings on diagnostic imaging of breast: Secondary | ICD-10-CM

## 2013-12-08 ENCOUNTER — Telehealth: Payer: Self-pay | Admitting: Family Medicine

## 2013-12-08 MED ORDER — TRAMADOL HCL 50 MG PO TABS: 50.0000 mg | ORAL_TABLET | Freq: Four times a day (QID) | ORAL | Status: DC | PRN

## 2013-12-08 NOTE — Telephone Encounter (Signed)
Tramadol  Last seen 08/14/13 and filled 10/27/13 #16. Please advise on quantity?  Patient is requesting Kelly [name]      KP

## 2013-12-08 NOTE — Telephone Encounter (Signed)
Ok to fill tramadol---who was writing [place]

## 2013-12-08 NOTE — Telephone Encounter (Signed)
MSG left to call the office      KP 

## 2013-12-08 NOTE — Telephone Encounter (Signed)
Pt is needing new rx traMADol (ULTRAM) 50 MG tablet , also pt would like to know if dr. Etter Sjogren can provide her an rx for lunesta 2 mg, to assist with sleeping. Pt states she tried it and works for her. States she has trouble staying asleep at night. Started a couple months ago. Please call when available for pick up.

## 2013-12-09 NOTE — Telephone Encounter (Signed)
Msg left to call the office     KP 

## 2013-12-09 NOTE — Telephone Encounter (Signed)
Patient stated that she tried someone else's Lunesta and it worked for her, please advise    KP

## 2013-12-09 NOTE — Telephone Encounter (Signed)
Returning call, call pts work @ [phone]

## 2013-12-09 NOTE — Telephone Encounter (Signed)
We would need to discuss sleep med  Tramadol #60 Also remind her it is illegal to take someone elses med

## 2013-12-10 NOTE — Telephone Encounter (Signed)
Please schedule the patient an appointment for Insomnia per Dr.Lowne.     KP

## 2013-12-16 NOTE — Telephone Encounter (Signed)
Pt will call back to schedule appt.

## 2014-01-18 ENCOUNTER — Emergency Department (HOSPITAL_COMMUNITY): Payer: BC Managed Care – PPO

## 2014-01-18 ENCOUNTER — Emergency Department (HOSPITAL_COMMUNITY)
Admission: EM | Admit: 2014-01-18 | Discharge: 2014-01-18 | Disposition: A | Discharge status: Home / Self Care | Payer: BC Managed Care – PPO | Attending: Emergency Medicine | Admitting: Emergency Medicine

## 2014-01-18 ENCOUNTER — Encounter (HOSPITAL_COMMUNITY): Payer: Self-pay | Admitting: Emergency Medicine

## 2014-01-18 DIAGNOSIS — Z4651 Encounter for fitting and adjustment of gastric lap band: Secondary | ICD-10-CM

## 2014-01-18 DIAGNOSIS — Z79899 Other long term (current) drug therapy: Secondary | ICD-10-CM | POA: Insufficient documentation

## 2014-01-18 DIAGNOSIS — I10 Essential (primary) hypertension: Secondary | ICD-10-CM | POA: Insufficient documentation

## 2014-01-18 DIAGNOSIS — R112 Nausea with vomiting, unspecified: Secondary | ICD-10-CM | POA: Insufficient documentation

## 2014-01-18 DIAGNOSIS — Z9889 Other specified postprocedural states: Secondary | ICD-10-CM | POA: Diagnosis not present

## 2014-01-18 DIAGNOSIS — F419 Anxiety disorder, unspecified: Secondary | ICD-10-CM | POA: Insufficient documentation

## 2014-01-18 DIAGNOSIS — R079 Chest pain, unspecified: Secondary | ICD-10-CM | POA: Diagnosis not present

## 2014-01-18 DIAGNOSIS — Z8709 Personal history of other diseases of the respiratory system: Secondary | ICD-10-CM | POA: Insufficient documentation

## 2014-01-18 LAB — CBC
HCT: 42.4 % (ref 36.0–46.0)
HEMOGLOBIN: 14.4 g/dL (ref 12.0–15.0)
MCH: 30.8 pg (ref 26.0–34.0)
MCHC: 34 g/dL (ref 30.0–36.0)
MCV: 90.8 fL (ref 78.0–100.0)
PLATELETS: 280 10*3/uL (ref 150–400)
RBC: 4.67 MIL/uL (ref 3.87–5.11)
RDW: 11.8 % (ref 11.5–15.5)
WBC: 10.3 10*3/uL (ref 4.0–10.5)

## 2014-01-18 LAB — BASIC METABOLIC PANEL
Anion gap: 16 — ABNORMAL HIGH (ref 5–15)
BUN: 10 mg/dL (ref 6–23)
CALCIUM: 9.5 mg/dL (ref 8.4–10.5)
CO2: 23 mEq/L (ref 19–32)
Chloride: 100 mEq/L (ref 96–112)
Creatinine, Ser: 0.89 mg/dL (ref 0.50–1.10)
GFR calc Af Amer: 87 mL/min — ABNORMAL LOW (ref >90)
GFR, EST NON AFRICAN AMERICAN: 75 mL/min — AB (ref >90)
GLUCOSE: 87 mg/dL (ref 70–99)
Potassium: 3.5 mEq/L — ABNORMAL LOW (ref 3.7–5.3)
Sodium: 139 mEq/L (ref 137–147)

## 2014-01-18 LAB — I-STAT TROPONIN, ED: TROPONIN I, POC: 0 ng/mL (ref 0.00–0.08)

## 2014-01-18 LAB — LIPASE, BLOOD: Lipase: 28 U/L (ref 11–59)

## 2014-01-18 MED ORDER — ONDANSETRON HCL 4 MG/2ML IJ SOLN
4.0000 mg | Freq: Once | INTRAMUSCULAR | Status: AC
  Administered 2014-01-18: 4 mg via INTRAVENOUS
  Filled 2014-01-18: qty 2

## 2014-01-18 MED ORDER — LORAZEPAM 2 MG/ML IJ SOLN
1.0000 mg | Freq: Once | INTRAMUSCULAR | Status: AC
  Administered 2014-01-18: 1 mg via INTRAVENOUS
  Filled 2014-01-18: qty 1

## 2014-01-18 MED ORDER — SODIUM CHLORIDE 0.9 % IV BOLUS (SEPSIS)
500.0000 mL | Freq: Once | INTRAVENOUS | Status: AC
  Administered 2014-01-18: 500 mL via INTRAVENOUS

## 2014-01-18 MED ORDER — MORPHINE SULFATE 4 MG/ML IJ SOLN
4.0000 mg | Freq: Once | INTRAMUSCULAR | Status: AC
  Administered 2014-01-18: 4 mg via INTRAVENOUS
  Filled 2014-01-18: qty 1

## 2014-01-18 MED ORDER — SODIUM CHLORIDE 0.9 % IV SOLN
INTRAVENOUS | Status: DC
  Administered 2014-01-18: 10:00:00 via INTRAVENOUS

## 2014-01-18 NOTE — ED Provider Notes (Signed)
CSN: [id number]     Arrival date & time 01/18/14  0912 History   First MD Initiated Contact with Patient 01/18/14 [phone]     Chief Complaint  Patient presents with  . Lap Band   . Emesis  . Chest Pain     (Consider location/radiation/quality/duration/timing/severity/associated sxs/prior Treatment) HPI  Kelly [name] is a 48 y.o. female presents for evaluation of worsening chest discomfort present for 2 days, continuously, and worsened over the last evening. She had preceding symptoms of persistent vomiting of "slime", and difficulty eating usual amounts of food. She feels like she is having a complications from her lap band procedure. She has not seen her surgeon in about 18 months. She last had a lap band adjustment, May 2014. She did not follow-up in 6 weeks, as planned, at that time. There is been no fever, cough, shortness of breath, diarrhea, weakness or dizziness. There are no other known modifying factors.   Past Medical History  Diagnosis Date  . Hypertension   . Allergic rhinitis   . Anxiety    Past Surgical History  Procedure Laterality Date  . Laparoscopic gastric banding  05-23-09    dr Abran Cantor  . Liposuction     Family History  Problem Relation Age of Onset  . Hypertension Mother   . Aneurysm Mother 36    brain  . Hypertension Father   . Hypertension Maternal Grandmother   . Hypertension Paternal Grandmother   . Heart disease Paternal Grandmother 22    MI  . Diabetes Paternal Aunt   . Heart disease Maternal Grandfather     MI  . Alzheimer's disease Paternal Grandfather    History  Substance Use Topics  . Smoking status: Never Smoker   . Smokeless tobacco: Never Used  . Alcohol Use: 4.8 oz/week    8 Glasses of wine per week   OB History   Grav Para Term Preterm Abortions TAB SAB Ect Mult Living                 Review of Systems  All other systems reviewed and are negative.     Allergies  Review of patient's allergies indicates no known  allergies.  Home Medications   Prior to Admission medications   Medication Sig Start Date End Date Taking? Authorizing Provider  ALPRAZolam (XANAX) 0.25 MG tablet Take 0.25 mg by mouth 3 (three) times daily as needed for anxiety.   Yes Historical Provider, MD  calcium carbonate (TUMS - DOSED IN MG ELEMENTAL CALCIUM) 500 MG chewable tablet Chew 2 tablets by mouth daily as needed for indigestion or heartburn.   Yes Historical Provider, MD  Cholecalciferol (VITAMIN D) 2000 UNITS CAPS Take 1 capsule by mouth daily.   Yes Historical Provider, MD  eszopiclone (LUNESTA) 2 MG TABS tablet Take 2 mg by mouth at bedtime as needed for sleep. Take immediately before bedtime   Yes Historical Provider, MD  traMADol (ULTRAM) 50 MG tablet Take 50 mg by mouth every 6 (six) hours as needed for moderate pain.   Yes Historical Provider, MD  valACYclovir (VALTREX) 1000 MG tablet Take 500 mg by mouth daily.  12/18/10  Yes Historical Provider, MD   BP 144/72  Pulse 85  Temp(Src) 98.4 F (36.9 C) (Oral)  Resp 16  Wt 179 lb 8 oz (81.421 kg)  SpO2 100%  LMP 01/03/2014 Physical Exam  Nursing note and vitals reviewed. Constitutional: She is oriented to person, place, and time. She appears well-developed and well-nourished.  She appears distressed (She is uncomfortable).  HENT:  Head: Normocephalic and atraumatic.  Right Ear: External ear normal.  Left Ear: External ear normal.  Eyes: Conjunctivae and EOM are normal. Pupils are equal, round, and reactive to light.  Neck: Normal range of motion and phonation normal. Neck supple.  Cardiovascular: Normal rate, regular rhythm and normal heart sounds.   Pulmonary/Chest: Effort normal and breath sounds normal. No respiratory distress. She has no wheezes. She has no rales. She exhibits no tenderness and no bony tenderness.  Abdominal: Soft. She exhibits no distension and no mass. There is no tenderness. There is no guarding.  Musculoskeletal: Normal range of motion.   Neurological: She is alert and oriented to person, place, and time. No cranial nerve deficit or sensory deficit. She exhibits normal muscle tone. Coordination normal.  Skin: Skin is warm, dry and intact.  Psychiatric: Her behavior is normal. Judgment and thought content normal.  She appears anxious    ED Course  Procedures (including critical care time) Medications  0.9 %  sodium chloride infusion ( Intravenous New Bag/Given 01/18/14 1006)  sodium chloride 0.9 % bolus 500 mL (0 mLs Intravenous Stopped 01/18/14 1419)  ondansetron (ZOFRAN) injection 4 mg (4 mg Intravenous Given 01/18/14 1002)  LORazepam (ATIVAN) injection 1 mg (1 mg Intravenous Given 01/18/14 1002)  morphine 4 MG/ML injection 4 mg (4 mg Intravenous Given 01/18/14 1002)  ondansetron (ZOFRAN) injection 4 mg (4 mg Intravenous Given 01/18/14 1417)    Patient Vitals for the past 24 hrs:  BP Temp Temp src Pulse Resp SpO2 Weight  01/18/14 1438 144/72 mmHg - - 85 16 100 % -  01/18/14 1358 - - - - 16 - -  01/18/14 1315 - - - 88 - 99 % -  01/18/14 1130 143/67 mmHg - - 92 14 99 % -  01/18/14 1100 - - - 91 14 100 % -  01/18/14 1029 - - - - - - 179 lb 8 oz (81.421 kg)  01/18/14 1013 - - - - - - 176 lb 8 oz (80.06 kg)  01/18/14 0924 172/83 mmHg 98.4 F (36.9 C) Oral 99 18 100 % -    10:51 AM Reevaluation with update and discussion. After initial assessment and treatment, an updated evaluation reveals her pain and nausea have improved with the treatment. Call placed to Gen. surgery, bariatrician. Jakaiden Fill L   15:35- . She was seen by general surgery, who withdrew some saline from her lip band device. At this time, she is tolerating oral fluids without complication.  Labs Review Labs Reviewed  BASIC METABOLIC PANEL - Abnormal; Notable for the following:    Potassium 3.5 (*)    GFR calc non Af Amer 75 (*)    GFR calc Af Amer 87 (*)    Anion gap 16 (*)    All other components within normal limits  CBC  LIPASE, BLOOD   I-STAT TROPOININ, ED    Imaging Review Dg Abd 1 View  01/18/2014   CLINICAL DATA:  Chronic vomiting. 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 Bowel gas pattern is unremarkable. No obstruction or free air. There is an apparent phleboliths in the mid left pelvis. Moderate stool is present in the colon.  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 Overall bowel gas pattern unremarkable.   Electronically Signed   By: Gwyndolyn Saxon  Jasmine December M.D.   On: 01/18/2014 14:04   Dg Chest Port 1 View  01/18/2014   CLINICAL DATA:  Epigastric pain, nonsmoker  EXAM: PORTABLE CHEST - 1 VIEW  COMPARISON:  04/14/2009  FINDINGS: The heart size and mediastinal contours are within normal limits. Both lungs are clear. The visualized skeletal structures are unremarkable.  IMPRESSION: No active disease.   Electronically Signed   By: Kathreen Devoid   On: 01/18/2014 09:44     EKG Interpretation   Date/Time:  Monday January 18 2014 09:19:23 EDT Ventricular Rate:  103 PR Interval:  159 QRS Duration: 80 QT Interval:  352 QTC Calculation: 461 R Axis:   51 Text Interpretation:  Sinus tachycardia Probable left atrial enlargement  Since last tracing rate faster Confirmed by Domonick Sittner  MD, Sayuri Rhames ([id number]) on  01/18/2014 9:55:29 AM      MDM   Final diagnoses:  Nausea and vomiting, vomiting of unspecified type  Encounter for adjustment of gastric lap band    Vomiting related to gastric banding. No evidence for metabolic instability or serious bacterial infection.  Nursing Notes Reviewed/ Care Coordinated Applicable Imaging Reviewed Interpretation of Laboratory Data incorporated into ED treatment  The patient appears reasonably screened and/or stabilized for discharge and I doubt any other medical condition or other [facility] requiring further screening, evaluation, or treatment in the ED at  this time prior to discharge.  Plan: Home Medications- usual; Home Treatments- rest, gradually advance diet; return here if the recommended treatment, does not improve the symptoms; Recommended follow up- General Surg. 1-2 weeks    Richarda Blade, MD 01/18/14 [phone]

## 2014-01-18 NOTE — Discharge Instructions (Signed)

## 2014-01-18 NOTE — ED Notes (Signed)
MD at bedside. 

## 2014-01-18 NOTE — ED Notes (Signed)
Pt states she had lap band surgery in 2011 and has since lost 100 lbs.  States that she began having issues with vomiting after eating "a while ago".  Pt unable to give exact time frame, just states that it has been going on for a long time.  States that she saw Dr. Excell Seltzer about a year and a half ago but her insurance did not want to pay for further tx so she stopped going.  States that she began having central chest/epigastric pain on Saturday which she attributes to the lap band.  Describes it as a pressure.  States that she ate a piece of bacon and a sip of coffee yesterday and started vomiting and has not eaten since then.  Had a sip of water this morning.  Pt vomiting in triage.  Denies diarrhea.

## 2014-01-18 NOTE — Consult Note (Signed)
Patient seen and examined.  Band adjustment performed sterilely at the bedside.  5 cc of fluid evacuated.

## 2014-01-18 NOTE — Consult Note (Signed)
Reason for Consult: chest pain  Nausea and vomiting;  s/p lap band placement May 2014. Referring Physician: Dr. Christ Kick  Emergency department at [facility] is an 48 y.o. female.  HPI: Pt is here today with some chest discomfort, pressure in her mid upper chest, some nausea and what she calls slime that comes up with these symptoms.  She had this back in 01/2012 and the fluid was removed with good results.  UGI on follow up 07/01/12, showed the band in good position without slippage.  She was sent home with 5cc of saline in her band, with plans to increase it to 5.25-5.5 ml. She has not been back because of insurance issues since that visit.  She reports having a pain in her upper chest along with some nausea and vomiting what she calls slime.  It doesn't sound like allot, but it is a frequent issue.  Large volumes or volume over a short period of time cause these symptoms.  She says she has not been able to eat or drink since Saturday,01/16/14, without symptoms.  This chest pain and upper GI discomfort brought her to the ED this AM. Work up shows VSS, ;abs show the K+ is down some at 3.5.  Troponin is 0.0, CXR is normal and  CBC is normal and EKG is showing sinus tachycardia.  She is currently comfortable after treatment in the ED with Ativan, morphine and Zofran.  We are ask to see.  Past Medical History  Diagnosis Date  Hypertension   Allergic rhinitis   Possible restless leg syndrome   Hypothyroid, but being evaluated for Goiter   Hiatal hernia   Hx of HSV   Anxiety     Past Surgical History  Procedure Laterality Date  . Laparoscopic gastric banding  05-23-09    dr Abran Cantor  . Liposuction      Family History  Problem Relation Age of Onset  . Hypertension Mother   . Aneurysm Mother 80    brain  . Hypertension Father   . Hypertension Maternal Grandmother   . Hypertension Paternal Grandmother   . Heart disease Paternal Grandmother 69    MI  . Diabetes Paternal Aunt    . Heart disease Maternal Grandfather     MI  . Alzheimer's disease Paternal Grandfather     Social History:  reports that she has never smoked. She has never used smokeless tobacco. She reports that she drinks about 4.8 ounces of alcohol per week. She reports that she does not use illicit drugs.  Allergies: No Known Allergies  Medications:  Prior to Admission medications   Medication Sig Start Date End Date Taking? Authorizing Provider  ALPRAZolam (XANAX) 0.25 MG tablet Take 0.25 mg by mouth 3 (three) times daily as needed for anxiety.   Yes Historical Provider, MD  calcium carbonate (TUMS - DOSED IN MG ELEMENTAL CALCIUM) 500 MG chewable tablet Chew 2 tablets by mouth daily as needed for indigestion or heartburn.   Yes Historical Provider, MD  Cholecalciferol (VITAMIN D) 2000 UNITS CAPS Take 1 capsule by mouth daily.   Yes Historical Provider, MD  eszopiclone (LUNESTA) 2 MG TABS tablet Take 2 mg by mouth at bedtime as needed for sleep. Take immediately before bedtime   Yes Historical Provider, MD  traMADol (ULTRAM) 50 MG tablet Take 50 mg by mouth every 6 (six) hours as needed for moderate pain.   Yes Historical Provider, MD  valACYclovir (VALTREX) 1000 MG tablet Take 500 mg  by mouth daily.  12/18/10  Yes Historical Provider, MD    Prior to Admission:  . sodium chloride 125 mL/hr at 01/18/14 1006   PRN: Anti-infectives   None      Results for orders placed during the hospital encounter of 01/18/14 (from the past 48 hour(s))  CBC     Status: None   Collection Time    01/18/14 10:13 AM      Result Value Ref Range   WBC 10.3  4.0 - 10.5 K/uL   RBC 4.67  3.87 - 5.11 MIL/uL   Hemoglobin 14.4  12.0 - 15.0 g/dL   HCT 42.4  36.0 - 46.0 %   MCV 90.8  78.0 - 100.0 fL   MCH 30.8  26.0 - 34.0 pg   MCHC 34.0  30.0 - 36.0 g/dL   RDW 11.8  11.5 - 15.5 %   Platelets 280  150 - 400 K/uL  BASIC METABOLIC PANEL     Status: Abnormal   Collection Time    01/18/14 10:13 AM      Result Value  Ref Range   Sodium 139  137 - 147 mEq/L   Potassium 3.5 (*) 3.7 - 5.3 mEq/L   Chloride 100  96 - 112 mEq/L   CO2 23  19 - 32 mEq/L   Glucose, Bld 87  70 - 99 mg/dL   BUN 10  6 - 23 mg/dL   Creatinine, Ser 0.89  0.50 - 1.10 mg/dL   Calcium 9.5  8.4 - 10.5 mg/dL   GFR calc non Af Amer 75 (*) >90 mL/min   GFR calc Af Amer 87 (*) >90 mL/min   Comment: (NOTE)     The eGFR has been calculated using the CKD EPI equation.     This calculation has not been validated in all clinical situations.     eGFR's persistently <90 mL/min signify possible Chronic Kidney     Disease.   Anion gap 16 (*) 5 - 15  LIPASE, BLOOD     Status: None   Collection Time    01/18/14 10:13 AM      Result Value Ref Range   Lipase 28  11 - 59 U/L  I-STAT TROPOININ, ED     Status: None   Collection Time    01/18/14 10:18 AM      Result Value Ref Range   Troponin i, poc 0.00  0.00 - 0.08 ng/mL   Comment 3            Comment: Due to the release kinetics of cTnI,     a negative result within the first hours     of the onset of symptoms does not rule out     myocardial infarction with certainty.     If myocardial infarction is still suspected,     repeat the test at appropriate intervals.    Dg Chest Port 1 View  01/18/2014   CLINICAL DATA:  Epigastric pain, nonsmoker  EXAM: PORTABLE CHEST - 1 VIEW  COMPARISON:  04/14/2009  FINDINGS: The heart size and mediastinal contours are within normal limits. Both lungs are clear. The visualized skeletal structures are unremarkable.  IMPRESSION: No active disease.   Electronically Signed   By: Kathreen Devoid   On: 01/18/2014 09:44    Review of Systems  Constitutional: Negative.        She reports about 100 pound weight loss since surgery.  HENT: Negative.   Respiratory: Negative.  Cardiovascular: Positive for chest pain. Negative for palpitations (MID upper chest pain/that is like a fullness.  she also gets this with reflux like sx.), orthopnea, claudication, leg swelling  and PND.  Gastrointestinal: Positive for heartburn, nausea, vomiting and abdominal pain. Negative for diarrhea, constipation, blood in stool and melena.  Genitourinary: Negative.   Musculoskeletal:       Bone spurs in her foot  Skin: Negative.   Neurological: Negative.   Endo/Heme/Allergies: Negative.        On thyroid supplement and being evaluated for goiter.  Psychiatric/Behavioral: The patient is nervous/anxious.    Blood pressure 143/67, pulse 92, temperature 98.4 F (36.9 C), temperature source Oral, resp. rate 14, weight 81.421 kg (179 lb 8 oz), last menstrual period 01/03/2014, SpO2 99.00%. Physical Exam  Constitutional: She is oriented to person, place, and time. She appears well-developed and well-nourished. She appears distressed.  HENT:  Head: Normocephalic and atraumatic.  Nose: Nose normal.  Eyes: Conjunctivae and EOM are normal. Pupils are equal, round, and reactive to light. Left eye exhibits no discharge.  Neck: Normal range of motion. Neck supple. No JVD present. No tracheal deviation present. Thyromegaly present.  Cardiovascular: Normal rate, regular rhythm, normal heart sounds and intact distal pulses.   No murmur heard. Respiratory: Effort normal and breath sounds normal. No respiratory distress. She has no wheezes. She has no rales. She exhibits no tenderness.  GI: Soft. Bowel sounds are normal. She exhibits no distension and no mass. There is no tenderness. There is no rebound and no guarding.  Port site looks fine.  Sites for prior surgery all healed well.  Musculoskeletal: She exhibits no edema and no tenderness.  Lymphadenopathy:    She has no cervical adenopathy.  Neurological: She is alert and oriented to person, place, and time. No cranial nerve deficit.  Skin: Skin is warm and dry. No rash noted. She is not diaphoretic. No erythema. No pallor.  Psychiatric: She has a normal mood and affect. Her behavior is normal. Judgment and thought content normal.     Assessment/Plan: 1.  Nausea, vomiting and chest pain 2.  S/p laparscopic banding and hiatal hernia  Procedure 2/28/114. 3.  Abnormal thyroid  Plan:  She has been seen by Dr. Zella Richer, and we have removed 5 cc of saline from the lap band.  Film shows the band in the proper position.  We are going to recommend she try clears and if she does well she can go home..   I have told her to stay on clears today, go to full liquids tomorrow, then advance as tolerated.  She will need to follow up with our office and Dr. Excell Seltzer.  Lilo Wallington 01/18/2014, 12:52 PM

## 2014-01-18 NOTE — ED Notes (Signed)
X-ray at bedside

## 2014-05-17 ENCOUNTER — Telehealth: Payer: Self-pay

## 2014-05-17 ENCOUNTER — Telehealth: Payer: Self-pay | Admitting: Family Medicine

## 2014-05-17 NOTE — Telephone Encounter (Signed)
noted 

## 2014-05-17 NOTE — Telephone Encounter (Signed)
[facility]  Patient Name: Kelly [name]  Gender: Female  DOB: 07/25/1953   Age: 49 Y 9 M 20 D  Return Phone Number: [phone] (Primary)  Address:   City/State/Zip: [place]    Client [place]  Client Site [facility]  Physician Ronette Deter   Contact Type Call  Call Type Triage / Clinical     Relationship To Patient Self  Appointment Disposition EMR Appointment Scheduled  Return Phone Number [phone] (Primary)  Chief Complaint Nasal Congestion  Initial Comment caller states she is taking Lithium and was told to take a decongestant but its lingering so she wants to take Echinacea and Vit C           Info pasted into Epic Yes   Nurse Assessment  Nurse: Shawn Stall, RN, Trish Date/Time (Eastern Time): 05/17/2014 11:52:49 AM  Confirm and document reason for call. If symptomatic, describe symptoms. ---Patient is calling for self and caller states she is taking Lithium and was told to take a decongestant but its lingering so she wants to take Echinacea and Vit C. She has been on 2 ABX and H/A and is not feeling better. She does not want to go on another ABX. 5 years ago Patient had to be seen at ENT and had to be placed on a high power ABX. She can't take decongestant and is going to take these Vitc and Echinacea and rest. If she is not better in a couple of days she would like to be seen by MD.  Has the patient traveled out of the country within the last 30 days? ---Not Applicable  Does the patient require triage? ---Declined Triage  Please document clinical information provided and list any resource used. ---Patient didn't not want to be triaged she just wanted MD to know what her plan was and schedule an appointment.     Guidelines      Guideline Title Affirmed Question Affirmed Notes Nurse Date/Time (Eastern  Time)         Disp. Time Eilene Ghazi Time) Disposition Final User          05/17/2014 10:51:18 AM Send To Clinical Follow Up Queue Yes [facility]       After Care Instructions Given     Call Event Type User Date / Time Description

## 2014-05-17 NOTE — Telephone Encounter (Signed)
Pt states she called earlier today to scheduled an appointment for elevated heart rate/palpitations.  She was transferred to speak to [facility].  She was never triaged.  She was called back 3 times later by [facility], but she was unable to answer her phone due to being at work.  She's calling back now to schedule an appointment.  Pt states she has been having palpitations for months.  She is almost certain Dr. Etter [name] is already aware of the palpitations.  According to patient, they typically occur at night and whenever she is at rest.  Occasionally she will experience them whenever she gets hot or whenever she's at work.  She denies associated chest pain, shortness of breath, or fatigue/faint feeling.  She shares that she does drink coffee in the morning, but she does not consume an excessive amount.  Pt wore a Holter monitor back in May of last year.  She also has a hx of anxiety.   An appointment was scheduled tomorrow with Dr. Etter [name] at 6 pm per patient's request.  Pt was strongly encouraged to go to ER if symptom worsens or new symptoms develop.  She stated understanding and agreed.

## 2014-05-17 NOTE — Telephone Encounter (Signed)
FYI

## 2014-05-18 ENCOUNTER — Ambulatory Visit (INDEPENDENT_AMBULATORY_CARE_PROVIDER_SITE_OTHER): Payer: BLUE CROSS/BLUE SHIELD | Admitting: Family Medicine

## 2014-05-18 ENCOUNTER — Encounter: Payer: Self-pay | Admitting: Family Medicine

## 2014-05-18 VITALS — BP 132/80 | HR 101 | Temp 99.0°F | Wt 217.8 lb

## 2014-05-18 DIAGNOSIS — F419 Anxiety disorder, unspecified: Secondary | ICD-10-CM

## 2014-05-18 DIAGNOSIS — R002 Palpitations: Secondary | ICD-10-CM

## 2014-05-18 DIAGNOSIS — F411 Generalized anxiety disorder: Secondary | ICD-10-CM

## 2014-05-18 DIAGNOSIS — R000 Tachycardia, unspecified: Secondary | ICD-10-CM

## 2014-05-18 MED ORDER — METOPROLOL SUCCINATE ER 25 MG PO TB24: 25.0000 mg | ORAL_TABLET | Freq: Every day | ORAL | Status: DC

## 2014-05-18 MED ORDER — ALPRAZOLAM 0.25 MG PO TABS: 0.2500 mg | ORAL_TABLET | Freq: Three times a day (TID) | ORAL | Status: DC | PRN

## 2014-05-18 NOTE — Assessment & Plan Note (Signed)
May be cause of palpitations but will have pt see cardiology Refill xanax

## 2014-05-18 NOTE — Progress Notes (Signed)
Subjective:    Patient ID: Pincus Badder, female    DOB: 07/03/1965, 49 y.o.   MRN: [id number]  HPI  Patient here c/o palpitations.  Hr has been as high as 122 -----this started before her thyroid issues for years.   It has started to get worse.   It is worse when she lays down --- keeps her awake at night.   No chest pain or sob.  Pt denies any stress.    Past Medical History  Diagnosis Date  . Hypertension   . Allergic rhinitis   . Anxiety     Review of Systems  Constitutional: Negative for activity change, appetite change, fatigue and unexpected weight change.  Respiratory: Positive for chest tightness. Negative for cough, shortness of breath and wheezing.   Cardiovascular: Positive for palpitations. Negative for chest pain and leg swelling.  Psychiatric/Behavioral: Negative for behavioral problems and dysphoric mood. The patient is not nervous/anxious.        Objective:    Physical Exam  Constitutional: She is oriented to person, place, and time. She appears well-developed and well-nourished. No distress.  HENT:  Right Ear: External ear normal.  Left Ear: External ear normal.  Nose: Nose normal.  Mouth/Throat: Oropharynx is clear and moist.  Eyes: EOM are normal. Pupils are equal, round, and reactive to light.  Neck: Normal range of motion. Neck supple.  Cardiovascular: Normal rate and regular rhythm.   Murmur heard. Pulmonary/Chest: Effort normal and breath sounds normal. No respiratory distress. She has no wheezes. She has no rales. She exhibits no tenderness.  Neurological: She is alert and oriented to person, place, and time.  Psychiatric: She has a normal mood and affect. Her behavior is normal. Judgment and thought content normal.    BP 132/80 mmHg  Pulse 101  Temp(Src) 99 F (37.2 C) (Oral)  Wt 217 lb 12.8 oz (98.793 kg)  SpO2 97% Wt Readings from Last 3 Encounters:  05/18/14 217 lb 12.8 oz (98.793 kg)  01/18/14 179 lb 8 oz (81.421 kg)  10/09/13 197 lb  (89.359 kg)     Lab Results  Component Value Date   WBC 10.3 01/18/2014   HGB 14.4 01/18/2014   HCT 42.4 01/18/2014   PLT 280 01/18/2014   GLUCOSE 87 01/18/2014   CHOL 172 09/05/2012   TRIG 81 09/05/2012   HDL 64 09/05/2012   LDLCALC 92 09/05/2012   ALT 12 07/09/2013   AST 18 07/09/2013   NA 139 01/18/2014   K 3.5* 01/18/2014   CL 100 01/18/2014   CREATININE 0.89 01/18/2014   BUN 10 01/18/2014   CO2 23 01/18/2014   TSH 1.83 07/09/2013   MICROALBUR 0.50 09/05/2012    Dg Abd 1 View  01/18/2014   CLINICAL DATA:  Chronic vomiting. 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 Bowel gas pattern is unremarkable. No obstruction or free air. There is an apparent phleboliths in the mid left pelvis. Moderate stool is present in the colon.  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 Overall bowel gas pattern unremarkable.   Electronically Signed   By: Lowella Grip M.D.   On: 01/18/2014 14:04   Dg Chest Port 1 View  01/18/2014   CLINICAL DATA:  Epigastric pain, nonsmoker  EXAM: PORTABLE CHEST - 1 VIEW  COMPARISON:  04/14/2009  FINDINGS: The heart size and  mediastinal contours are within normal limits. Both lungs are clear. The visualized skeletal structures are unremarkable.  IMPRESSION: No active disease.   Electronically Signed   By: Kathreen Devoid   On: 01/18/2014 09:44       Assessment & Plan:   Problem List Items Addressed This Visit    Palpitations - Primary    Will have pt see cardiology for further evaluation toprol xl 25 mg 1 po qd rto prn      Relevant Medications   ALPRAZolam  (XANAX) tablet   Other Relevant Orders   EKG 12-Lead (Completed)   Ambulatory referral to Cardiology   Anxiety state    May be cause of palpitations but will have pt see cardiology Refill xanax      Relevant Medications   ALPRAZolam  Duanne Moron) tablet      Other Visit Diagnoses    Tachycardia        Relevant Medications    ALPRAZolam  (XANAX) tablet    Other Relevant Orders    Ambulatory referral to Cardiology    Anxiety disorder, unspecified anxiety disorder type        Relevant Medications    ALPRAZolam  Duanne Moron) tablet        [name], DO

## 2014-05-18 NOTE — Patient Instructions (Signed)

## 2014-05-18 NOTE — Assessment & Plan Note (Signed)
Will have pt see cardiology for further evaluation toprol xl 25 mg 1 po qd rto prn

## 2014-05-18 NOTE — Progress Notes (Signed)
Pre visit review using our clinic review tool, if applicable. No additional management support is needed unless otherwise documented below in the visit note. 

## 2014-08-19 ENCOUNTER — Telehealth: Payer: Self-pay | Admitting: Family Medicine

## 2014-08-19 MED ORDER — METOPROLOL SUCCINATE ER 25 MG PO TB24: 25.0000 mg | ORAL_TABLET | Freq: Every day | ORAL | Status: DC

## 2014-08-19 NOTE — Telephone Encounter (Signed)
Rx faxed.    KP 

## 2014-08-19 NOTE — Telephone Encounter (Signed)
Caller name: Makailyn Mccormick Relationship to patient: self Can be reached: [phone] Pharmacy: [facility] on [address]  Reason for call: Pt requesting refill on metoprolol succinate (TOPROL-XL) 25 MG 24 hr tablet. Pt has 1 dose left. Taking 1/day. Will not have for tomorrow.

## 2014-11-16 ENCOUNTER — Telehealth: Payer: Self-pay | Admitting: Family Medicine

## 2014-11-16 MED ORDER — METOPROLOL SUCCINATE ER 25 MG PO TB24: 25.0000 mg | ORAL_TABLET | Freq: Every day | ORAL | Status: DC

## 2014-11-16 NOTE — Telephone Encounter (Signed)
Pharmacy: [facility] on [address]  Reason for call: Pt needing refill on metoprolol. Did not call pharmacy bc no refills left. Takes 1/day. Has 2 pills left.

## 2014-11-16 NOTE — Telephone Encounter (Signed)
Scheduled for 01/07/15 KO

## 2014-11-16 NOTE — Telephone Encounter (Signed)
Rx faxed.   Please schedule the patient a Physical Exam.      KP

## 2014-12-10 ENCOUNTER — Other Ambulatory Visit: Payer: Self-pay | Admitting: Obstetrics and Gynecology

## 2014-12-13 ENCOUNTER — Other Ambulatory Visit: Payer: Self-pay | Admitting: Obstetrics and Gynecology

## 2014-12-13 DIAGNOSIS — R928 Other abnormal and inconclusive findings on diagnostic imaging of breast: Secondary | ICD-10-CM

## 2014-12-13 LAB — CYTOLOGY - PAP

## 2014-12-24 ENCOUNTER — Ambulatory Visit
Admission: RE | Admit: 2014-12-24 | Discharge: 2014-12-24 | Disposition: A | Discharge status: Home / Self Care | Payer: BLUE CROSS/BLUE SHIELD | Source: Ambulatory Visit | Attending: Obstetrics and Gynecology | Admitting: Obstetrics and Gynecology

## 2014-12-24 DIAGNOSIS — R928 Other abnormal and inconclusive findings on diagnostic imaging of breast: Secondary | ICD-10-CM

## 2015-01-07 ENCOUNTER — Encounter: Payer: Self-pay | Admitting: Family Medicine

## 2015-01-07 ENCOUNTER — Ambulatory Visit (INDEPENDENT_AMBULATORY_CARE_PROVIDER_SITE_OTHER): Payer: BLUE CROSS/BLUE SHIELD | Admitting: Family Medicine

## 2015-01-07 ENCOUNTER — Encounter (INDEPENDENT_AMBULATORY_CARE_PROVIDER_SITE_OTHER): Payer: Self-pay

## 2015-01-07 VITALS — BP 134/72 | HR 83 | Temp 98.2°F | Ht 63.75 in | Wt 236.4 lb

## 2015-01-07 DIAGNOSIS — I10 Essential (primary) hypertension: Secondary | ICD-10-CM

## 2015-01-07 DIAGNOSIS — M150 Primary generalized (osteo)arthritis: Secondary | ICD-10-CM

## 2015-01-07 DIAGNOSIS — Z0000 Encounter for general adult medical examination without abnormal findings: Secondary | ICD-10-CM | POA: Diagnosis not present

## 2015-01-07 DIAGNOSIS — F419 Anxiety disorder, unspecified: Secondary | ICD-10-CM

## 2015-01-07 DIAGNOSIS — R002 Palpitations: Secondary | ICD-10-CM | POA: Diagnosis not present

## 2015-01-07 DIAGNOSIS — R000 Tachycardia, unspecified: Secondary | ICD-10-CM

## 2015-01-07 DIAGNOSIS — E559 Vitamin D deficiency, unspecified: Secondary | ICD-10-CM

## 2015-01-07 DIAGNOSIS — M159 Polyosteoarthritis, unspecified: Secondary | ICD-10-CM

## 2015-01-07 DIAGNOSIS — R601 Generalized edema: Secondary | ICD-10-CM

## 2015-01-07 MED ORDER — ALPRAZOLAM 0.25 MG PO TABS: 0.2500 mg | ORAL_TABLET | Freq: Three times a day (TID) | ORAL | Status: DC | PRN

## 2015-01-07 MED ORDER — TRAMADOL HCL 50 MG PO TABS: 50.0000 mg | ORAL_TABLET | Freq: Four times a day (QID) | ORAL | Status: DC | PRN

## 2015-01-07 MED ORDER — HYDROCHLOROTHIAZIDE 25 MG PO TABS: 25.0000 mg | ORAL_TABLET | Freq: Every day | ORAL | Status: DC

## 2015-01-07 NOTE — Patient Instructions (Signed)
Preventive Care for Adults, Female A healthy lifestyle and preventive care can promote health and wellness. Preventive health guidelines for women include the following key practices.  A routine yearly physical is a good way to check with your health care provider about your health and preventive screening. It is a chance to share any concerns and updates on your health and to receive a thorough exam.  Visit your dentist for a routine exam and preventive care every 6 months. Brush your teeth twice a day and floss once a day. Good oral hygiene prevents tooth decay and gum disease.  The frequency of eye exams is based on your age, health, family medical history, use of contact lenses, and other factors. Follow your health care provider's recommendations for frequency of eye exams.  Eat a healthy diet. Foods like vegetables, fruits, whole grains, low-fat dairy products, and lean protein foods contain the nutrients you need without too many calories. Decrease your intake of foods high in solid fats, added sugars, and salt. Eat the right amount of calories for you.Get information about a proper diet from your health care provider, if necessary.  Regular physical exercise is one of the most important things you can do for your health. Most adults should get at least 150 minutes of moderate-intensity exercise (any activity that increases your heart rate and causes you to sweat) each week. In addition, most adults need muscle-strengthening exercises on 2 or more days a week.  Maintain a healthy weight. The body mass index (BMI) is a screening tool to identify possible weight problems. It provides an estimate of body fat based on height and weight. Your health care provider can find your BMI and can help you achieve or maintain a healthy weight.For adults 20 years and older:  A BMI below 18.5 is considered underweight.  A BMI of 18.5 to 24.9 is normal.  A BMI of 25 to 29.9 is considered overweight.  A  BMI of 30 and above is considered obese.  Maintain normal blood lipids and cholesterol levels by exercising and minimizing your intake of saturated fat. Eat a balanced diet with plenty of fruit and vegetables. Blood tests for lipids and cholesterol should begin at age 45 and be repeated every 5 years. If your lipid or cholesterol levels are high, you are over 50, or you are at high risk for heart disease, you may need your cholesterol levels checked more frequently.Ongoing high lipid and cholesterol levels should be treated with medicines if diet and exercise are not working.  If you smoke, find out from your health care provider how to quit. If you do not use tobacco, do not start.  Lung cancer screening is recommended for adults aged 45-80 years who are at high risk for developing lung cancer because of a history of smoking. A yearly low-dose CT scan of the lungs is recommended for people who have at least a 30-pack-year history of smoking and are a current smoker or have quit within the past 15 years. A pack year of smoking is smoking an average of 1 pack of cigarettes a day for 1 year (for example: 1 pack a day for 30 years or 2 packs a day for 15 years). Yearly screening should continue until the smoker has stopped smoking for at least 15 years. Yearly screening should be stopped for people who develop a health problem that would prevent them from having lung cancer treatment.  If you are pregnant, do not drink alcohol. If you are  breastfeeding, be very cautious about drinking alcohol. If you are not pregnant and choose to drink alcohol, do not have more than 1 drink per day. One drink is considered to be 12 ounces (355 mL) of beer, 5 ounces (148 mL) of wine, or 1.5 ounces (44 mL) of liquor.  Avoid use of street drugs. Do not share needles with anyone. Ask for help if you need support or instructions about stopping the use of drugs.  High blood pressure causes heart disease and increases the risk  of stroke. Your blood pressure should be checked at least every 1 to 2 years. Ongoing high blood pressure should be treated with medicines if weight loss and exercise do not work.  If you are 55-79 years old, ask your health care provider if you should take aspirin to prevent strokes.  Diabetes screening is done by taking a blood sample to check your blood glucose level after you have not eaten for a certain period of time (fasting). If you are not overweight and you do not have risk factors for diabetes, you should be screened once every 3 years starting at age 45. If you are overweight or obese and you are 40-70 years of age, you should be screened for diabetes every year as part of your cardiovascular risk assessment.  Breast cancer screening is essential preventive care for women. You should practice "breast self-awareness." This means understanding the normal appearance and feel of your breasts and may include breast self-examination. Any changes detected, no matter how small, should be reported to a health care provider. Women in their 20s and 30s should have a clinical breast exam (CBE) by a health care provider as part of a regular health exam every 1 to 3 years. After age 40, women should have a CBE every year. Starting at age 40, women should consider having a mammogram (breast X-ray test) every year. Women who have a family history of breast cancer should talk to their health care provider about genetic screening. Women at a high risk of breast cancer should talk to their health care providers about having an MRI and a mammogram every year.  Breast cancer gene (BRCA)-related cancer risk assessment is recommended for women who have family members with BRCA-related cancers. BRCA-related cancers include breast, ovarian, tubal, and peritoneal cancers. Having family members with these cancers may be associated with an increased risk for harmful changes (mutations) in the breast cancer genes BRCA1 and  BRCA2. Results of the assessment will determine the need for genetic counseling and BRCA1 and BRCA2 testing.  Your health care provider may recommend that you be screened regularly for cancer of the pelvic organs (ovaries, uterus, and vagina). This screening involves a pelvic examination, including checking for microscopic changes to the surface of your cervix (Pap test). You may be encouraged to have this screening done every 3 years, beginning at age 21.  For women ages 30-65, health care providers may recommend pelvic exams and Pap testing every 3 years, or they may recommend the Pap and pelvic exam, combined with testing for human papilloma virus (HPV), every 5 years. Some types of HPV increase your risk of cervical cancer. Testing for HPV may also be done on women of any age with unclear Pap test results.  Other health care providers may not recommend any screening for nonpregnant women who are considered low risk for pelvic cancer and who do not have symptoms. Ask your health care provider if a screening pelvic exam is right for   you.  If you have had past treatment for cervical cancer or a condition that could lead to cancer, you need Pap tests and screening for cancer for at least 20 years after your treatment. If Pap tests have been discontinued, your risk factors (such as having a new sexual partner) need to be reassessed to determine if screening should resume. Some women have medical problems that increase the chance of getting cervical cancer. In these cases, your health care provider may recommend more frequent screening and Pap tests.  Colorectal cancer can be detected and often prevented. Most routine colorectal cancer screening begins at the age of 50 years and continues through age 75 years. However, your health care provider may recommend screening at an earlier age if you have risk factors for colon cancer. On a yearly basis, your health care provider may provide home test kits to check  for hidden blood in the stool. Use of a small camera at the end of a tube, to directly examine the colon (sigmoidoscopy or colonoscopy), can detect the earliest forms of colorectal cancer. Talk to your health care provider about this at age 50, when routine screening begins. Direct exam of the colon should be repeated every 5-10 years through age 75 years, unless early forms of precancerous polyps or small growths are found.  People who are at an increased risk for hepatitis B should be screened for this virus. You are considered at high risk for hepatitis B if:  You were born in a country where hepatitis B occurs often. Talk with your health care provider about which countries are considered high risk.  Your parents were born in a high-risk country and you have not received a shot to protect against hepatitis B (hepatitis B vaccine).  You have HIV or AIDS.  You use needles to inject street drugs.  You live with, or have sex with, someone who has hepatitis B.  You get hemodialysis treatment.  You take certain medicines for conditions like cancer, organ transplantation, and autoimmune conditions.  Hepatitis C blood testing is recommended for all people born from 1945 through 1965 and any individual with known risks for hepatitis C.  Practice safe sex. Use condoms and avoid high-risk sexual practices to reduce the spread of sexually transmitted infections (STIs). STIs include gonorrhea, chlamydia, syphilis, trichomonas, herpes, HPV, and human immunodeficiency virus (HIV). Herpes, HIV, and HPV are viral illnesses that have no cure. They can result in disability, cancer, and death.  You should be screened for sexually transmitted illnesses (STIs) including gonorrhea and chlamydia if:  You are sexually active and are younger than 24 years.  You are older than 24 years and your health care provider tells you that you are at risk for this type of infection.  Your sexual activity has changed  since you were last screened and you are at an increased risk for chlamydia or gonorrhea. Ask your health care provider if you are at risk.  If you are at risk of being infected with HIV, it is recommended that you take a prescription medicine daily to prevent HIV infection. This is called preexposure prophylaxis (PrEP). You are considered at risk if:  You are sexually active and do not regularly use condoms or know the HIV status of your partner(s).  You take drugs by injection.  You are sexually active with a partner who has HIV.  Talk with your health care provider about whether you are at high risk of being infected with HIV. If   you choose to begin PrEP, you should first be tested for HIV. You should then be tested every 3 months for as long as you are taking PrEP.  Osteoporosis is a disease in which the bones lose minerals and strength with aging. This can result in serious bone fractures or breaks. The risk of osteoporosis can be identified using a bone density scan. Women ages 67 years and over and women at risk for fractures or osteoporosis should discuss screening with their health care providers. Ask your health care provider whether you should take a calcium supplement or vitamin D to reduce the rate of osteoporosis.  Menopause can be associated with physical symptoms and risks. Hormone replacement therapy is available to decrease symptoms and risks. You should talk to your health care provider about whether hormone replacement therapy is right for you.  Use sunscreen. Apply sunscreen liberally and repeatedly throughout the day. You should seek shade when your shadow is shorter than you. Protect yourself by wearing long sleeves, pants, a wide-brimmed hat, and sunglasses year round, whenever you are outdoors.  Once a month, do a whole body skin exam, using a mirror to look at the skin on your back. Tell your health care provider of new moles, moles that have irregular borders, moles that  are larger than a pencil eraser, or moles that have changed in shape or color.  Stay current with required vaccines (immunizations).  Influenza vaccine. All adults should be immunized every year.  Tetanus, diphtheria, and acellular pertussis (Td, Tdap) vaccine. Pregnant women should receive 1 dose of Tdap vaccine during each pregnancy. The dose should be obtained regardless of the length of time since the last dose. Immunization is preferred during the 27th-36th week of gestation. An adult who has not previously received Tdap or who does not know her vaccine status should receive 1 dose of Tdap. This initial dose should be followed by tetanus and diphtheria toxoids (Td) booster doses every 10 years. Adults with an unknown or incomplete history of completing a 3-dose immunization series with Td-containing vaccines should begin or complete a primary immunization series including a Tdap dose. Adults should receive a Td booster every 10 years.  Varicella vaccine. An adult without evidence of immunity to varicella should receive 2 doses or a second dose if she has previously received 1 dose. Pregnant females who do not have evidence of immunity should receive the first dose after pregnancy. This first dose should be obtained before leaving the health care facility. The second dose should be obtained 4-8 weeks after the first dose.  Human papillomavirus (HPV) vaccine. Females aged 13-26 years who have not received the vaccine previously should obtain the 3-dose series. The vaccine is not recommended for use in pregnant females. However, pregnancy testing is not needed before receiving a dose. If a female is found to be pregnant after receiving a dose, no treatment is needed. In that case, the remaining doses should be delayed until after the pregnancy. Immunization is recommended for any person with an immunocompromised condition through the age of 61 years if she did not get any or all doses earlier. During the  3-dose series, the second dose should be obtained 4-8 weeks after the first dose. The third dose should be obtained 24 weeks after the first dose and 16 weeks after the second dose.  Zoster vaccine. One dose is recommended for adults aged 30 years or older unless certain conditions are present.  Measles, mumps, and rubella (MMR) vaccine. Adults born  before 1957 generally are considered immune to measles and mumps. Adults born in 1957 or later should have 1 or more doses of MMR vaccine unless there is a contraindication to the vaccine or there is laboratory evidence of immunity to each of the three diseases. A routine second dose of MMR vaccine should be obtained at least 28 days after the first dose for students attending postsecondary schools, health care workers, or international travelers. People who received inactivated measles vaccine or an unknown type of measles vaccine during 1963-1967 should receive 2 doses of MMR vaccine. People who received inactivated mumps vaccine or an unknown type of mumps vaccine before 1979 and are at high risk for mumps infection should consider immunization with 2 doses of MMR vaccine. For females of childbearing age, rubella immunity should be determined. If there is no evidence of immunity, females who are not pregnant should be vaccinated. If there is no evidence of immunity, females who are pregnant should delay immunization until after pregnancy. Unvaccinated health care workers born before 1957 who lack laboratory evidence of measles, mumps, or rubella immunity or laboratory confirmation of disease should consider measles and mumps immunization with 2 doses of MMR vaccine or rubella immunization with 1 dose of MMR vaccine.  Pneumococcal 13-valent conjugate (PCV13) vaccine. When indicated, a person who is uncertain of his immunization history and has no record of immunization should receive the PCV13 vaccine. All adults 65 years of age and older should receive this  vaccine. An adult aged 19 years or older who has certain medical conditions and has not been previously immunized should receive 1 dose of PCV13 vaccine. This PCV13 should be followed with a dose of pneumococcal polysaccharide (PPSV23) vaccine. Adults who are at high risk for pneumococcal disease should obtain the PPSV23 vaccine at least 8 weeks after the dose of PCV13 vaccine. Adults older than 50 years of age who have normal immune system function should obtain the PPSV23 vaccine dose at least 1 year after the dose of PCV13 vaccine.  Pneumococcal polysaccharide (PPSV23) vaccine. When PCV13 is also indicated, PCV13 should be obtained first. All adults aged 65 years and older should be immunized. An adult younger than age 65 years who has certain medical conditions should be immunized. Any person who resides in a nursing home or long-term care facility should be immunized. An adult smoker should be immunized. People with an immunocompromised condition and certain other conditions should receive both PCV13 and PPSV23 vaccines. People with human immunodeficiency virus (HIV) infection should be immunized as soon as possible after diagnosis. Immunization during chemotherapy or radiation therapy should be avoided. Routine use of PPSV23 vaccine is not recommended for American Indians, [place] Natives, or people younger than 65 years unless there are medical conditions that require PPSV23 vaccine. When indicated, people who have unknown immunization and have no record of immunization should receive PPSV23 vaccine. One-time revaccination 5 years after the first dose of PPSV23 is recommended for people aged 19-64 years who have chronic kidney failure, nephrotic syndrome, asplenia, or immunocompromised conditions. People who received 1-2 doses of PPSV23 before age 65 years should receive another dose of PPSV23 vaccine at age 65 years or later if at least 5 years have passed since the previous dose. Doses of PPSV23 are not  needed for people immunized with PPSV23 at or after age 65 years.  Meningococcal vaccine. Adults with asplenia or persistent complement component deficiencies should receive 2 doses of quadrivalent meningococcal conjugate (MenACWY-D) vaccine. The doses should be obtained   at least 2 months apart. Microbiologists working with certain meningococcal bacteria, [place] recruits, people at risk during an outbreak, and people who travel to or live in countries with a high rate of meningitis should be immunized. A first-year college student up through age 34 years who is living in a residence hall should receive a dose if she did not receive a dose on or after her 16th birthday. Adults who have certain high-risk conditions should receive one or more doses of vaccine.  Hepatitis A vaccine. Adults who wish to be protected from this disease, have certain high-risk conditions, work with hepatitis A-infected animals, work in hepatitis A research labs, or travel to or work in countries with a high rate of hepatitis A should be immunized. Adults who were previously unvaccinated and who anticipate close contact with an international adoptee during the first 60 days after arrival in the [place] from a country with a high rate of hepatitis A should be immunized.  Hepatitis B vaccine. Adults who wish to be protected from this disease, have certain high-risk conditions, may be exposed to blood or other infectious body fluids, are household contacts or sex partners of hepatitis B positive people, are clients or workers in certain care facilities, or travel to or work in countries with a high rate of hepatitis B should be immunized.  Haemophilus influenzae type b (Hib) vaccine. A previously unvaccinated person with asplenia or sickle cell disease or having a scheduled splenectomy should receive 1 dose of Hib vaccine. Regardless of previous immunization, a recipient of a hematopoietic stem cell transplant should receive a  3-dose series 6-12 months after her successful transplant. Hib vaccine is not recommended for adults with HIV infection. Preventive Services / Frequency Ages 35 to 4 years  Blood pressure check.** / Every 3-5 years.  Lipid and cholesterol check.** / Every 5 years beginning at age 60.  Clinical breast exam.** / Every 3 years for women in their 71s and 10s.  BRCA-related cancer risk assessment.** / For women who have family members with a BRCA-related cancer (breast, ovarian, tubal, or peritoneal cancers).  Pap test.** / Every 2 years from ages 76 through 26. Every 3 years starting at age 61 through age 76 or 93 with a history of 3 consecutive normal Pap tests.  HPV screening.** / Every 3 years from ages 37 through ages 60 to 51 with a history of 3 consecutive normal Pap tests.  Hepatitis C blood test.** / For any individual with known risks for hepatitis C.  Skin self-exam. / Monthly.  Influenza vaccine. / Every year.  Tetanus, diphtheria, and acellular pertussis (Tdap, Td) vaccine.** / Consult your health care provider. Pregnant women should receive 1 dose of Tdap vaccine during each pregnancy. 1 dose of Td every 10 years.  Varicella vaccine.** / Consult your health care provider. Pregnant females who do not have evidence of immunity should receive the first dose after pregnancy.  HPV vaccine. / 3 doses over 6 months, if 93 and younger. The vaccine is not recommended for use in pregnant females. However, pregnancy testing is not needed before receiving a dose.  Measles, mumps, rubella (MMR) vaccine.** / You need at least 1 dose of MMR if you were born in 1957 or later. You may also need a 2nd dose. For females of childbearing age, rubella immunity should be determined. If there is no evidence of immunity, females who are not pregnant should be vaccinated. If there is no evidence of immunity, females who are  pregnant should delay immunization until after pregnancy.  Pneumococcal  13-valent conjugate (PCV13) vaccine.** / Consult your health care provider.  Pneumococcal polysaccharide (PPSV23) vaccine.** / 1 to 2 doses if you smoke cigarettes or if you have certain conditions.  Meningococcal vaccine.** / 1 dose if you are age 68 to 8 years and a Market researcher living in a residence hall, or have one of several medical conditions, you need to get vaccinated against meningococcal disease. You may also need additional booster doses.  Hepatitis A vaccine.** / Consult your health care provider.  Hepatitis B vaccine.** / Consult your health care provider.  Haemophilus influenzae type b (Hib) vaccine.** / Consult your health care provider. Ages 7 to 53 years  Blood pressure check.** / Every year.  Lipid and cholesterol check.** / Every 5 years beginning at age 25 years.  Lung cancer screening. / Every year if you are aged 11-80 years and have a 30-pack-year history of smoking and currently smoke or have quit within the past 15 years. Yearly screening is stopped once you have quit smoking for at least 15 years or develop a health problem that would prevent you from having lung cancer treatment.  Clinical breast exam.** / Every year after age 48 years.  BRCA-related cancer risk assessment.** / For women who have family members with a BRCA-related cancer (breast, ovarian, tubal, or peritoneal cancers).  Mammogram.** / Every year beginning at age 41 years and continuing for as long as you are in good health. Consult with your health care provider.  Pap test.** / Every 3 years starting at age 65 years through age 37 or 70 years with a history of 3 consecutive normal Pap tests.  HPV screening.** / Every 3 years from ages 72 years through ages 60 to 40 years with a history of 3 consecutive normal Pap tests.  Fecal occult blood test (FOBT) of stool. / Every year beginning at age 21 years and continuing until age 5 years. You may not need to do this test if you get  a colonoscopy every 10 years.  Flexible sigmoidoscopy or colonoscopy.** / Every 5 years for a flexible sigmoidoscopy or every 10 years for a colonoscopy beginning at age 35 years and continuing until age 48 years.  Hepatitis C blood test.** / For all people born from [birth year] through 1965 and any individual with known risks for hepatitis C.  Skin self-exam. / Monthly.  Influenza vaccine. / Every year.  Tetanus, diphtheria, and acellular pertussis (Tdap/Td) vaccine.** / Consult your health care provider. Pregnant women should receive 1 dose of Tdap vaccine during each pregnancy. 1 dose of Td every 10 years.  Varicella vaccine.** / Consult your health care provider. Pregnant females who do not have evidence of immunity should receive the first dose after pregnancy.  Zoster vaccine.** / 1 dose for adults aged 30 years or older.  Measles, mumps, rubella (MMR) vaccine.** / You need at least 1 dose of MMR if you were born in 1957 or later. You may also need a second dose. For females of childbearing age, rubella immunity should be determined. If there is no evidence of immunity, females who are not pregnant should be vaccinated. If there is no evidence of immunity, females who are pregnant should delay immunization until after pregnancy.  Pneumococcal 13-valent conjugate (PCV13) vaccine.** / Consult your health care provider.  Pneumococcal polysaccharide (PPSV23) vaccine.** / 1 to 2 doses if you smoke cigarettes or if you have certain conditions.  Meningococcal vaccine.** /  Consult your health care provider.  Hepatitis A vaccine.** / Consult your health care provider.  Hepatitis B vaccine.** / Consult your health care provider.  Haemophilus influenzae type b (Hib) vaccine.** / Consult your health care provider. Ages 64 years and over  Blood pressure check.** / Every year.  Lipid and cholesterol check.** / Every 5 years beginning at age 23 years.  Lung cancer screening. / Every year if you  are aged 16-80 years and have a 30-pack-year history of smoking and currently smoke or have quit within the past 15 years. Yearly screening is stopped once you have quit smoking for at least 15 years or develop a health problem that would prevent you from having lung cancer treatment.  Clinical breast exam.** / Every year after age 74 years.  BRCA-related cancer risk assessment.** / For women who have family members with a BRCA-related cancer (breast, ovarian, tubal, or peritoneal cancers).  Mammogram.** / Every year beginning at age 44 years and continuing for as long as you are in good health. Consult with your health care provider.  Pap test.** / Every 3 years starting at age 58 years through age 22 or 39 years with 3 consecutive normal Pap tests. Testing can be stopped between 65 and 70 years with 3 consecutive normal Pap tests and no abnormal Pap or HPV tests in the past 10 years.  HPV screening.** / Every 3 years from ages 64 years through ages 70 or 61 years with a history of 3 consecutive normal Pap tests. Testing can be stopped between 65 and 70 years with 3 consecutive normal Pap tests and no abnormal Pap or HPV tests in the past 10 years.  Fecal occult blood test (FOBT) of stool. / Every year beginning at age 40 years and continuing until age 27 years. You may not need to do this test if you get a colonoscopy every 10 years.  Flexible sigmoidoscopy or colonoscopy.** / Every 5 years for a flexible sigmoidoscopy or every 10 years for a colonoscopy beginning at age 7 years and continuing until age 32 years.  Hepatitis C blood test.** / For all people born from [birth year] through 1966 and any individual with known risks for hepatitis C.  Osteoporosis screening.** / A one-time screening for women ages 30 years and over and women at risk for fractures or osteoporosis.  Skin self-exam. / Monthly.  Influenza vaccine. / Every year.  Tetanus, diphtheria, and acellular pertussis (Tdap/Td)  vaccine.** / 1 dose of Td every 10 years.  Varicella vaccine.** / Consult your health care provider.  Zoster vaccine.** / 1 dose for adults aged 35 years or older.  Pneumococcal 13-valent conjugate (PCV13) vaccine.** / Consult your health care provider.  Pneumococcal polysaccharide (PPSV23) vaccine.** / 1 dose for all adults aged 46 years and older.  Meningococcal vaccine.** / Consult your health care provider.  Hepatitis A vaccine.** / Consult your health care provider.  Hepatitis B vaccine.** / Consult your health care provider.  Haemophilus influenzae type b (Hib) vaccine.** / Consult your health care provider. ** Family history and personal history of risk and conditions may change your health care provider's recommendations.   This information is not intended to replace advice given to you by your health care provider. Make sure you discuss any questions you have with your health care provider.   Document Released: 05/08/2001 Document Revised: 04/02/2014 Document Reviewed: 08/07/2010 Elsevier Interactive Patient Education [employer].

## 2015-01-07 NOTE — Progress Notes (Signed)
Pre visit review using our clinic review tool, if applicable. No additional management support is needed unless otherwise documented below in the visit note. 

## 2015-01-08 ENCOUNTER — Encounter: Payer: Self-pay | Admitting: Family Medicine

## 2015-01-08 DIAGNOSIS — Z0000 Encounter for general adult medical examination without abnormal findings: Secondary | ICD-10-CM | POA: Insufficient documentation

## 2015-01-08 NOTE — Progress Notes (Signed)
Subjective:     Kelly [name] is a 49 y.o. female and is here for a comprehensive physical exam. The patient reports problems - trouble sleeping but is taking 2 xanax and it is helping.  Social History   Social History  . Marital Status: Single    Spouse Name: N/A  . Number of Children: N/A  . Years of Education: N/A   Occupational History  . [employer]-- lab assistant    Social History Main Topics  . Smoking status: Never Smoker   . Smokeless tobacco: Never Used  . Alcohol Use: 4.8 oz/week    8 Glasses of wine per week  . Drug Use: No  . Sexual Activity: No   Other Topics Concern  . Not on file   Social History Narrative   Health Maintenance  Topic Date Due  . HIV Screening  10/22/1979  . COLONOSCOPY  10/22/2014  . INFLUENZA VACCINE  10/25/2015  . MAMMOGRAM  12/10/2015  . TETANUS/TDAP  08/08/2016  . PAP SMEAR  12/09/2017    The following portions of the patient's history were reviewed and updated as appropriate:  She  has a past medical history of Hypertension; Allergic rhinitis; and Anxiety. She  does not have any pertinent problems on file. She  has past surgical history that includes Laparoscopic gastric banding (05-23-09) and Liposuction. Her family history includes Alzheimer's disease in her paternal grandfather; Aneurysm (age of onset: 48) in her mother; Diabetes in her paternal aunt; Heart disease in her maternal grandfather; Heart disease (age of onset: 51) in her paternal grandmother; Hypertension in her father, maternal grandmother, mother, and paternal grandmother. She  reports that she has never smoked. She has never used smokeless tobacco. She reports that she drinks about 4.8 oz of alcohol per week. She reports that she does not use illicit drugs. She has a current medication list which includes the following prescription(s): alprazolam, levothyroxine, metoprolol succinate, tramadol, valacyclovir, and hydrochlorothiazide. Current Outpatient  Prescriptions on File Prior to Visit  Medication Sig Dispense Refill  . metoprolol succinate (TOPROL-XL) 25 MG 24 hr tablet Take 1 tablet (25 mg total) by mouth daily. 30 tablet 2  . valACYclovir (VALTREX) 1000 MG tablet Take 500 mg by mouth daily.      No current facility-administered medications on file prior to visit.   She has No Known Allergies..  Review of Systems Review of Systems  Constitutional: Negative for activity change, appetite change and fatigue.  HENT: Negative for hearing loss, congestion, tinnitus and ear discharge.  dentist q43m Eyes: Negative for visual disturbance (see optho q1y -- vision corrected to 20/20 with glasses).  Respiratory: Negative for cough, chest tightness and shortness of breath.   Cardiovascular: Negative for chest pain, palpitations and leg swelling.  Gastrointestinal: Negative for abdominal pain, diarrhea, constipation and abdominal distention.  Genitourinary: Negative for urgency, frequency, decreased urine volume and difficulty urinating.  Musculoskeletal: Negative for back pain, arthralgias and gait problem.  Skin: Negative for color change, pallor and rash.  Neurological: Negative for dizziness, light-headedness, numbness and headaches.  Hematological: Negative for adenopathy. Does not bruise/bleed easily.  Psychiatric/Behavioral: Negative for suicidal ideas, confusion, sleep disturbance, self-injury, dysphoric mood, decreased concentration and agitation.       Objective:    BP 134/72 mmHg  Pulse 83  Temp(Src) 98.2 F (36.8 C) (Oral)  Ht 5' 3.75" (1.619 m)  Wt 236 lb 6.4 oz (107.23 kg)  BMI 40.91 kg/m2  SpO2 98%  LMP 12/13/2014 General appearance: alert, cooperative,  appears stated age and no distress Head: Normocephalic, without obvious abnormality, atraumatic Eyes: conjunctivae/corneas clear. PERRL, EOM's intact. Fundi benign. Ears: normal TM's and external ear canals both ears Nose: Nares normal. Septum midline. Mucosa normal.  No drainage or sinus tenderness. Throat: lips, mucosa, and tongue normal; teeth and gums normal Neck: no adenopathy, no carotid bruit, no JVD, supple, symmetrical, trachea midline and thyroid not enlarged, symmetric, no tenderness/mass/nodules Back: symmetric, no curvature. ROM normal. No CVA tenderness. Lungs: clear to auscultation bilaterally Breasts: gyn Heart: regular rate and rhythm, S1, S2 normal, no murmur, click, rub or gallop Abdomen: soft, non-tender; bowel sounds normal; no masses,  no organomegaly Pelvic: deferred--gyn Extremities: extremities normal, atraumatic, no cyanosis or edema Pulses: 2+ and symmetric Skin: Skin color, texture, turgor normal. No rashes or lesions Lymph nodes: Cervical, supraclavicular, and axillary nodes normal. Neurologic: Alert and oriented X 3, normal strength and tone. Normal symmetric reflexes. Normal coordination and gait Psych- no depression, no anxiety      Assessment:    Healthy female exam.       Plan:     ghm utd Check labs See After Visit Summary for Counseling Recommendations    1. Palpitations Not new--- stable- ALPRAZolam (XANAX) 0.25 MG tablet; Take 1 tablet (0.25 mg total) by mouth 3 (three) times daily as needed for anxiety.  Dispense: 60 tablet; Refill: 2  2. Tachycardia Not new - ALPRAZolam (XANAX) 0.25 MG tablet; Take 1 tablet (0.25 mg total) by mouth 3 (three) times daily as needed for anxiety.  Dispense: 60 tablet; Refill: 2  3. Anxiety disorder, unspecified anxiety disorder type   - ALPRAZolam (XANAX) 0.25 MG tablet; Take 1 tablet (0.25 mg total) by mouth 3 (three) times daily as needed for anxiety.  Dispense: 60 tablet; Refill: 2  4. Primary osteoarthritis involving multiple joints  - traMADol (ULTRAM) 50 MG tablet; Take 1 tablet (50 mg total) by mouth every 6 (six) hours as needed for moderate pain.  Dispense: 30 tablet; Refill: 2  5. Generalized edema stable - hydrochlorothiazide (HYDRODIURIL) 25 MG tablet;  Take 1 tablet (25 mg total) by mouth daily.  Dispense: 30 tablet; Refill: 11 - Comp Met (CMET); Future - CBC with Differential/Platelet; Future - Hepatic function panel; Future - Lipid panel; Future - POCT urinalysis dipstick; Future - TSH; Future - Microalbumin / creatinine urine ratio; Future  6. Essential hypertension Stable Refill meds - Comp Met (CMET); Future - CBC with Differential/Platelet; Future - Hepatic function panel; Future - Lipid panel; Future - POCT urinalysis dipstick; Future - TSH; Future - Microalbumin / creatinine urine ratio; Future  7. Preventative health care See above - Comp Met (CMET); Future - CBC with Differential/Platelet; Future - Hepatic function panel; Future - Lipid panel; Future - POCT urinalysis dipstick; Future - TSH; Future - Microalbumin / creatinine urine ratio; Future - Ambulatory referral to Gastroenterology - HIV antibody  8. Vitamin D deficiency   - Vitamin D (25 hydroxy); Future

## 2015-01-08 NOTE — Progress Notes (Deleted)
Patient ID: Kelly [name], female    DOB: [birth year]  Age: 49 y.o. MRN: [id number]    Subjective:  Subjective HPI Kelly [name] presents for cpe.  She is struggling with sleep and has been taking 2 xanax which has helped.  No other complaints.    Review of Systems  Constitutional: Negative for fever, chills, diaphoresis, appetite change, fatigue and unexpected weight change.  HENT: Negative for congestion, ear pain, postnasal drip, rhinorrhea, sinus pressure and sore throat.   Eyes: Negative for pain, redness and visual disturbance.  Respiratory: Negative for cough, chest tightness, shortness of breath and wheezing.   Cardiovascular: Negative for chest pain, palpitations and leg swelling.  Endocrine: Negative for cold intolerance, heat intolerance, polydipsia, polyphagia and polyuria.  Genitourinary: Negative for dysuria, frequency and difficulty urinating.  Musculoskeletal: Negative for back pain and arthralgias.  Skin: Negative for rash and wound.  Allergic/Immunologic: Negative for environmental allergies.  Neurological: Negative for dizziness, light-headedness, numbness and headaches.  Hematological: Negative for adenopathy. Does not bruise/bleed easily.  Psychiatric/Behavioral: Negative for confusion and dysphoric mood. The patient is not nervous/anxious.     History Past Medical History  Diagnosis Date  . Hypertension   . Allergic rhinitis   . Anxiety     She has past surgical history that includes Laparoscopic gastric banding (05-23-09) and Liposuction.   Her family history includes Alzheimer's disease in her paternal grandfather; Aneurysm (age of onset: 80) in her mother; Diabetes in her paternal aunt; Heart disease in her maternal grandfather; Heart disease (age of onset: 83) in her paternal grandmother; Hypertension in her father, maternal grandmother, mother, and paternal grandmother.She reports that she has never smoked. She has never used smokeless tobacco. She  reports that she drinks about 4.8 oz of alcohol per week. She reports that she does not use illicit drugs.  Current Outpatient Prescriptions on File Prior to Visit  Medication Sig Dispense Refill  . metoprolol succinate (TOPROL-XL) 25 MG 24 hr tablet Take 1 tablet (25 mg total) by mouth daily. 30 tablet 2  . valACYclovir (VALTREX) 1000 MG tablet Take 500 mg by mouth daily.      No current facility-administered medications on file prior to visit.     Objective:  Objective Physical Exam  Constitutional: She is oriented to person, place, and time. She appears well-developed and well-nourished. No distress.  HENT:  Head: Normocephalic and atraumatic.  Right Ear: External ear normal.  Left Ear: External ear normal.  Nose: Nose normal.  Mouth/Throat: Oropharynx is clear and moist.  Eyes: Conjunctivae and EOM are normal. Pupils are equal, round, and reactive to light. Right eye exhibits no discharge. Left eye exhibits no discharge.  Neck: Normal range of motion. Neck supple. No JVD present. No thyromegaly present.  Cardiovascular: Normal rate, regular rhythm, normal heart sounds and intact distal pulses.   No murmur heard. Pulmonary/Chest: Effort normal and breath sounds normal. No respiratory distress. She has no wheezes. She has no rales. She exhibits no tenderness.  Abdominal: Soft. Bowel sounds are normal. She exhibits no distension and no mass. There is no tenderness. There is no rebound and no guarding.  Genitourinary: Vagina normal and uterus normal. Guaiac negative stool. No vaginal discharge found.  Musculoskeletal: Normal range of motion. She exhibits no edema or tenderness.  Lymphadenopathy:    She has no cervical adenopathy.  Neurological: She is alert and oriented to person, place, and time. She has normal reflexes. No cranial nerve deficit.  Skin: Skin is warm and  dry. No rash noted. She is not diaphoretic. No erythema.  Psychiatric: She has a normal mood and affect. Her  behavior is normal. Judgment and thought content normal.  Nursing note and vitals reviewed.  BP 134/72 mmHg  Pulse 83  Temp(Src) 98.2 F (36.8 C) (Oral)  Ht 5' 3.75" (1.619 m)  Wt 236 lb 6.4 oz (107.23 kg)  BMI 40.91 kg/m2  SpO2 98%  LMP 12/13/2014 Wt Readings from Last 3 Encounters:  01/07/15 236 lb 6.4 oz (107.23 kg)  05/18/14 217 lb 12.8 oz (98.793 kg)  01/18/14 179 lb 8 oz (81.421 kg)     Lab Results  Component Value Date   WBC 10.3 01/18/2014   HGB 14.4 01/18/2014   HCT 42.4 01/18/2014   PLT 280 01/18/2014   GLUCOSE 87 01/18/2014   CHOL 172 09/05/2012   TRIG 81 09/05/2012   HDL 64 09/05/2012   LDLCALC 92 09/05/2012   ALT 12 07/09/2013   AST 18 07/09/2013   NA 139 01/18/2014   K 3.5* 01/18/2014   CL 100 01/18/2014   CREATININE 0.89 01/18/2014   BUN 10 01/18/2014   CO2 23 01/18/2014   TSH 1.83 07/09/2013   MICROALBUR 0.50 09/05/2012    US Breast Ltd Uni Left Inc Axilla  12/24/2014  CLINICAL DATA:  49 year old female presenting for evaluation of possible left breast masses. EXAM: DIGITAL DIAGNOSTIC LEFT MAMMOGRAM WITH 3D TOMOSYNTHESIS AND CAD LEFT BREAST ULTRASOUND COMPARISON:  Previous exam(s). ACR Breast Density Category b: There are scattered areas of fibroglandular density. FINDINGS: In the subareolar slightly upper slightly outer left breast, there is a 1.2 cm circumscribed round mass. In the medial left breast, posterior depth there is a oval partially obscured mass measuring approximately 9 mm. Mammographic images were processed with CAD. Physical exam of the subareolar left breast demonstrates a smooth firm palpable nodule in the subareolar breast just above the nipple. No palpable abnormality is identified in the medial left breast to correspond with the 9 mm mass, posterior depth on the mammogram. Ultrasound targeted to the 1230 location, 1 cm from the nipple demonstrates an anechoic oval circumscribed mass measuring 1.1 x 0.5 x 1.0 cm, compatible with a benign  cyst. At 9 o'clock, 2 cm from the nipple there is a hypoechoic circumscribed mass measuring 0.6 x 0.3 x 0.4 cm. This may represent a cluster of cysts versus a fibroadenoma. IMPRESSION: 1. A benign 1.1 cm cyst in the subareolar left breast at 12:30 corresponds with the mammographic mass of concern. 2. A probably benign 0.6 cm hypoechoic circumscribed mass at 9 o'clock likely corresponds with the second mammographic mass of concern in the medial left breast, posterior depth. RECOMMENDATION: A left breast mammogram and ultrasound is recommended in 6 months to ensure stability of the probably benign focal asymmetry in the medial left breast, posterior depth, which likely corresponds with the 0.6 cm hypoechoic mass seen on ultrasound. I have discussed the findings and recommendations with the patient. Results were also provided in writing at the conclusion of the visit. If applicable, a reminder letter will be sent to the patient regarding the next appointment. BI-RADS CATEGORY  3: Probably benign. Electronically Signed   By: Ammie Ferrier M.D.   On: 12/24/2014 15:24   Mm Diag Breast Tomo Uni Left  12/24/2014  CLINICAL DATA:  49 year old female presenting for evaluation of possible left breast masses. EXAM: DIGITAL DIAGNOSTIC LEFT MAMMOGRAM WITH 3D TOMOSYNTHESIS AND CAD LEFT BREAST ULTRASOUND COMPARISON:  Previous exam(s). ACR Breast Density Category  b: There are scattered areas of fibroglandular density. FINDINGS: In the subareolar slightly upper slightly outer left breast, there is a 1.2 cm circumscribed round mass. In the medial left breast, posterior depth there is a oval partially obscured mass measuring approximately 9 mm. Mammographic images were processed with CAD. Physical exam of the subareolar left breast demonstrates a smooth firm palpable nodule in the subareolar breast just above the nipple. No palpable abnormality is identified in the medial left breast to correspond with the 9 mm mass, posterior  depth on the mammogram. Ultrasound targeted to the 1230 location, 1 cm from the nipple demonstrates an anechoic oval circumscribed mass measuring 1.1 x 0.5 x 1.0 cm, compatible with a benign cyst. At 9 o'clock, 2 cm from the nipple there is a hypoechoic circumscribed mass measuring 0.6 x 0.3 x 0.4 cm. This may represent a cluster of cysts versus a fibroadenoma. IMPRESSION: 1. A benign 1.1 cm cyst in the subareolar left breast at 12:30 corresponds with the mammographic mass of concern. 2. A probably benign 0.6 cm hypoechoic circumscribed mass at 9 o'clock likely corresponds with the second mammographic mass of concern in the medial left breast, posterior depth. RECOMMENDATION: A left breast mammogram and ultrasound is recommended in 6 months to ensure stability of the probably benign focal asymmetry in the medial left breast, posterior depth, which likely corresponds with the 0.6 cm hypoechoic mass seen on ultrasound. I have discussed the findings and recommendations with the patient. Results were also provided in writing at the conclusion of the visit. If applicable, a reminder letter will be sent to the patient regarding the next appointment. BI-RADS CATEGORY  3: Probably benign. Electronically Signed   By: Ammie Ferrier M.D.   On: 12/24/2014 15:24     Assessment & Plan:  Plan I have discontinued Ms. Yo's Vitamin D and calcium carbonate. I have also changed her traMADol. Additionally, I am having her start on hydrochlorothiazide. Lastly, I am having her maintain her valACYclovir, metoprolol succinate, levothyroxine, and ALPRAZolam.  Meds ordered this encounter  Medications  . levothyroxine (SYNTHROID, LEVOTHROID) 50 MCG tablet    Sig: Take 50 mcg by mouth daily before breakfast.  . ALPRAZolam (XANAX) 0.25 MG tablet    Sig: Take 1 tablet (0.25 mg total) by mouth 3 (three) times daily as needed for anxiety.    Dispense:  60 tablet    Refill:  2  . traMADol (ULTRAM) 50 MG tablet    Sig: Take 1  tablet (50 mg total) by mouth every 6 (six) hours as needed for moderate pain.    Dispense:  30 tablet    Refill:  2  . hydrochlorothiazide (HYDRODIURIL) 25 MG tablet    Sig: Take 1 tablet (25 mg total) by mouth daily.    Dispense:  30 tablet    Refill:  11    Problem List Items Addressed This Visit    Palpitations   Relevant Medications   ALPRAZolam (XANAX) 0.25 MG tablet   EDEMA   Relevant Medications   hydrochlorothiazide (HYDRODIURIL) 25 MG tablet   Other Relevant Orders   Comp Met (CMET)   CBC with Differential/Platelet   Hepatic function panel   Lipid panel   POCT urinalysis dipstick   TSH   Microalbumin / creatinine urine ratio    Other Visit Diagnoses    Preventative health care    -  Primary    Relevant Orders    Comp Met (CMET)    CBC with Differential/Platelet  Hepatic function panel    Lipid panel    POCT urinalysis dipstick    TSH    Microalbumin / creatinine urine ratio    Ambulatory referral to Gastroenterology    HIV antibody    Tachycardia        Relevant Medications    ALPRAZolam (XANAX) 0.25 MG tablet    Anxiety disorder, unspecified anxiety disorder type        Relevant Medications    ALPRAZolam (XANAX) 0.25 MG tablet    Primary osteoarthritis involving multiple joints        Relevant Medications    traMADol (ULTRAM) 50 MG tablet    Essential hypertension        Relevant Medications    hydrochlorothiazide (HYDRODIURIL) 25 MG tablet    Other Relevant Orders    Comp Met (CMET)    CBC with Differential/Platelet    Hepatic function panel    Lipid panel    POCT urinalysis dipstick    TSH    Microalbumin / creatinine urine ratio    Vitamin D deficiency        Relevant Orders    Vitamin D (25 hydroxy)       Follow-up: Return in about 6 months (around 07/08/2015), or if symptoms worsen or fail to improve, for hypertension.  [name], DO

## 2015-01-10 ENCOUNTER — Other Ambulatory Visit (INDEPENDENT_AMBULATORY_CARE_PROVIDER_SITE_OTHER): Payer: BLUE CROSS/BLUE SHIELD

## 2015-01-10 DIAGNOSIS — Z0000 Encounter for general adult medical examination without abnormal findings: Secondary | ICD-10-CM

## 2015-01-10 DIAGNOSIS — E559 Vitamin D deficiency, unspecified: Secondary | ICD-10-CM | POA: Diagnosis not present

## 2015-01-10 DIAGNOSIS — I10 Essential (primary) hypertension: Secondary | ICD-10-CM | POA: Diagnosis not present

## 2015-01-10 DIAGNOSIS — R601 Generalized edema: Secondary | ICD-10-CM | POA: Diagnosis not present

## 2015-01-10 LAB — CBC WITH DIFFERENTIAL/PLATELET
BASOS ABS: 0.1 10*3/uL (ref 0.0–0.1)
Basophils Relative: 0.8 % (ref 0.0–3.0)
EOS ABS: 0.8 10*3/uL — AB (ref 0.0–0.7)
Eosinophils Relative: 10.1 % — ABNORMAL HIGH (ref 0.0–5.0)
HEMATOCRIT: 38.2 % (ref 36.0–46.0)
HEMOGLOBIN: 12.8 g/dL (ref 12.0–15.0)
LYMPHS PCT: 36.6 % (ref 12.0–46.0)
Lymphs Abs: 3 10*3/uL (ref 0.7–4.0)
MCHC: 33.4 g/dL (ref 30.0–36.0)
MCV: 92.6 fl (ref 78.0–100.0)
MONOS PCT: 4.3 % (ref 3.0–12.0)
Monocytes Absolute: 0.4 10*3/uL (ref 0.1–1.0)
Neutro Abs: 4 10*3/uL (ref 1.4–7.7)
Neutrophils Relative %: 48.2 % (ref 43.0–77.0)
PLATELETS: 273 10*3/uL (ref 150.0–400.0)
RBC: 4.13 Mil/uL (ref 3.87–5.11)
RDW: 12.9 % (ref 11.5–15.5)
WBC: 8.2 10*3/uL (ref 4.0–10.5)

## 2015-01-10 LAB — HEPATIC FUNCTION PANEL
ALK PHOS: 64 U/L (ref 39–117)
ALT: 10 U/L (ref 0–35)
AST: 16 U/L (ref 0–37)
Albumin: 3.5 g/dL (ref 3.5–5.2)
BILIRUBIN DIRECT: 0.1 mg/dL (ref 0.0–0.3)
TOTAL PROTEIN: 7 g/dL (ref 6.0–8.3)
Total Bilirubin: 0.7 mg/dL (ref 0.2–1.2)

## 2015-01-10 LAB — MICROALBUMIN / CREATININE URINE RATIO
Creatinine,U: 203.7 mg/dL
Microalb Creat Ratio: 0.3 mg/g (ref 0.0–30.0)

## 2015-01-10 LAB — LIPID PANEL
Cholesterol: 164 mg/dL (ref 0–200)
HDL: 62 mg/dL (ref >39.00)
LDL CALC: 83 mg/dL (ref 0–99)
NONHDL: 101.77
Total CHOL/HDL Ratio: 3
Triglycerides: 92 mg/dL (ref 0.0–149.0)
VLDL: 18.4 mg/dL (ref 0.0–40.0)

## 2015-01-10 LAB — COMPREHENSIVE METABOLIC PANEL
ALT: 10 U/L (ref 0–35)
AST: 16 U/L (ref 0–37)
Albumin: 3.5 g/dL (ref 3.5–5.2)
Alkaline Phosphatase: 64 U/L (ref 39–117)
BILIRUBIN TOTAL: 0.7 mg/dL (ref 0.2–1.2)
BUN: 12 mg/dL (ref 6–23)
CO2: 29 mEq/L (ref 19–32)
Calcium: 9.1 mg/dL (ref 8.4–10.5)
Chloride: 103 mEq/L (ref 96–112)
Creatinine, Ser: 0.86 mg/dL (ref 0.40–1.20)
GFR: 89.75 mL/min (ref >60.00)
GLUCOSE: 80 mg/dL (ref 70–99)
Potassium: 3.7 mEq/L (ref 3.5–5.1)
SODIUM: 139 meq/L (ref 135–145)
TOTAL PROTEIN: 7 g/dL (ref 6.0–8.3)

## 2015-01-10 LAB — POCT URINALYSIS DIPSTICK
BILIRUBIN UA: NEGATIVE
Glucose, UA: NEGATIVE
Ketones, UA: NEGATIVE
LEUKOCYTES UA: NEGATIVE
Nitrite, UA: NEGATIVE
Protein, UA: NEGATIVE
RBC UA: NEGATIVE
Spec Grav, UA: 1.025
Urobilinogen, UA: NEGATIVE
pH, UA: 6

## 2015-01-10 LAB — TSH: TSH: 2.92 u[IU]/mL (ref 0.35–4.50)

## 2015-01-10 LAB — VITAMIN D 25 HYDROXY (VIT D DEFICIENCY, FRACTURES): VITD: 16.92 ng/mL — AB (ref 30.00–100.00)

## 2015-01-17 ENCOUNTER — Encounter: Payer: Self-pay | Admitting: Gastroenterology

## 2015-01-17 MED ORDER — VITAMIN D (ERGOCALCIFEROL) 1.25 MG (50000 UNIT) PO CAPS: 50000.0000 [IU] | ORAL_CAPSULE | ORAL | Status: DC

## 2015-01-17 NOTE — Addendum Note (Signed)
Addended by: Tasia Catchings on: 01/17/2015 10:26 AM   Modules accepted: Orders, Medications

## 2015-02-09 ENCOUNTER — Ambulatory Visit (AMBULATORY_SURGERY_CENTER): Payer: Self-pay | Admitting: *Deleted

## 2015-02-09 VITALS — Ht 64.0 in | Wt 234.2 lb

## 2015-02-09 DIAGNOSIS — Z1211 Encounter for screening for malignant neoplasm of colon: Secondary | ICD-10-CM

## 2015-02-09 MED ORDER — NA SULFATE-K SULFATE-MG SULF 17.5-3.13-1.6 GM/177ML PO SOLN: ORAL | Status: DC

## 2015-02-09 NOTE — Progress Notes (Signed)
No allergies to eggs or soy. No problems with anesthesia.  Pt given Emmi instructions for colonoscopy  No oxygen use  No diet drug use  

## 2015-02-24 ENCOUNTER — Encounter: Payer: Self-pay | Admitting: Gastroenterology

## 2015-02-25 ENCOUNTER — Telehealth: Payer: Self-pay | Admitting: Gastroenterology

## 2015-02-25 NOTE — Telephone Encounter (Signed)
LM on AM that identifies patient  that since prep is too expensive she can pick up a prep at the desk 4 th floor [facility]. Lm to pick this up as soon as she can and left number to [place] office to call wioth questions. Instructed her the prep instructions are the same as she received in [facility].  [name]   Medication Samples have been provided to the patient.  Drug name: surprep  Qty: 1  LOTNB:[id number]  Exp.Date: 65-18  The patient has been instructed regarding the correct time, dose, and frequency of taking this medication, including desired effects and most common side effects.   Kelly [name] 11:20 AM 02/25/2015

## 2015-03-04 ENCOUNTER — Ambulatory Visit (AMBULATORY_SURGERY_CENTER): Payer: BLUE CROSS/BLUE SHIELD | Admitting: Gastroenterology

## 2015-03-04 ENCOUNTER — Encounter: Payer: Self-pay | Admitting: Gastroenterology

## 2015-03-04 VITALS — BP 123/70 | HR 63 | Temp 96.7°F | Resp 44 | Ht 64.0 in | Wt 234.0 lb

## 2015-03-04 DIAGNOSIS — D123 Benign neoplasm of transverse colon: Secondary | ICD-10-CM | POA: Diagnosis not present

## 2015-03-04 DIAGNOSIS — K635 Polyp of colon: Secondary | ICD-10-CM

## 2015-03-04 DIAGNOSIS — Z1211 Encounter for screening for malignant neoplasm of colon: Secondary | ICD-10-CM | POA: Diagnosis not present

## 2015-03-04 HISTORY — DX: Polyp of colon: K63.5

## 2015-03-04 MED ORDER — SODIUM CHLORIDE 0.9 % IV SOLN: 500.0000 mL | INTRAVENOUS | Status: DC

## 2015-03-04 NOTE — Progress Notes (Signed)
Called to room to assist during endoscopic procedure.  Patient ID and intended procedure confirmed with present staff. Received instructions for my participation in the procedure from the performing physician.  

## 2015-03-04 NOTE — Op Note (Signed)
Kelly [name]  [address] [place], [id number]   COLONOSCOPY PROCEDURE REPORT  PATIENT: Kelly [name], Kelly [name]  MR#: [id number] BIRTHDATE: 06-21-1965 , 49  yrs. old GENDER: female ENDOSCOPIST: Milus Banister, MD REFERRED DN:[id number] Se[place], DO PROCEDURE DATE:  03/04/2015 PROCEDURE:   Colonoscopy, screening and Colonoscopy with snare polypectomy First Screening Colonoscopy - Avg.  risk and is 50 yrs.  old or older Yes.  Prior Negative Screening - Now for repeat screening. N/A  History of Adenoma - Now for follow-up colonoscopy & has been > or = to 3 yrs.  N/A  Polyps removed today? Yes ASA CLASS:   Class II INDICATIONS:Screening for colonic neoplasia and Colorectal Neoplasm Risk Assessment for this procedure is average risk. MEDICATIONS: Monitored anesthesia care and Propofol 250 mg IV  DESCRIPTION OF PROCEDURE:   After the risks benefits and alternatives of the procedure were thoroughly explained, informed consent was obtained.  The digital rectal exam revealed no abnormalities of the rectum.   The LB [id number]  endoscope was introduced through the anus and advanced to the cecum, which was identified by both the appendix and ileocecal valve. No adverse events experienced.   The quality of the prep was good.  The instrument was then slowly withdrawn as the colon was fully examined. Estimated blood loss is zero unless otherwise noted in this procedure report.  COLON FINDINGS: A semi-pedunculated polyp measuring 16 mm in size was found in the transverse colon.  A polypectomy was performed using snare cautery.  The resection was complete, the polyp tissue was completely retrieved and sent to histology.   The examination was otherwise normal.  Retroflexed views revealed no abnormalities. The time to cecum = 2.0 Withdrawal time = 8.0   The scope was withdrawn and the procedure completed. COMPLICATIONS: There were no immediate complications.  ENDOSCOPIC  IMPRESSION: 1.   Semi-pedunculated polyp was found in the transverse colon; polypectomy was performed using snare cautery 2.   The examination was otherwise normal  RECOMMENDATIONS: If the polyp(s) removed today are proven to be adenomatous (pre-cancerous) polyps, you will need a colonoscopy in 3 years. Otherwise you should continue to follow colorectal cancer screening guidelines for "routine risk" patients with a colonoscopy in 10 years.  You will receive a letter within 1-2 weeks with the results of your biopsy as well as final recommendations.  Please call my office if you have not received a letter after 3 weeks.  eSigned:  Milus Banister, MD 03/04/2015 3:15 PM

## 2015-03-04 NOTE — Patient Instructions (Signed)
YOU HAD AN ENDOSCOPIC PROCEDURE TODAY AT THE [facility]:   Refer to the procedure report that was given to you for any specific questions about what was found during the examination.  If the procedure report does not answer your questions, please call your gastroenterologist to clarify.  If you requested that your care partner not be given the details of your procedure findings, then the procedure report has been included in a sealed envelope for you to review at your convenience later.  YOU SHOULD EXPECT: Some feelings of bloating in the abdomen. Passage of more gas than usual.  Walking can help get rid of the air that was put into your GI tract during the procedure and reduce the bloating. If you had a lower endoscopy (such as a colonoscopy or flexible sigmoidoscopy) you may notice spotting of blood in your stool or on the toilet paper. If you underwent a bowel prep for your procedure, you may not have a normal bowel movement for a few days.  Please Note:  You might notice some irritation and congestion in your nose or some drainage.  This is from the oxygen used during your procedure.  There is no need for concern and it should clear up in a day or so.  SYMPTOMS TO REPORT IMMEDIATELY:   Following lower endoscopy (colonoscopy or flexible sigmoidoscopy):  Excessive amounts of blood in the stool  Significant tenderness or worsening of abdominal pains  Swelling of the abdomen that is new, acute  Fever of 100F or higher  For urgent or emergent issues, a gastroenterologist can be reached at any hour by calling [phone].  DIET: Your first meal following the procedure should be a small meal and then it is ok to progress to your normal diet. Heavy or fried foods are harder to digest and may make you feel nauseous or bloated.  Likewise, meals heavy in dairy and vegetables can increase bloating.  Drink plenty of fluids but you should avoid alcoholic beverages for 24 hours.  ACTIVITY:   You should plan to take it easy for the rest of today and you should NOT DRIVE or use heavy machinery until tomorrow (because of the sedation medicines used during the test).    FOLLOW UP: Our staff will call the number listed on your records the next business day following your procedure to check on you and address any questions or concerns that you may have regarding the information given to you following your procedure. If we do not reach you, we will leave a message.  However, if you are feeling well and you are not experiencing any problems, there is no need to return our call.  We will assume that you have returned to your regular daily activities without incident.  If any biopsies were taken you will be contacted by phone or by letter within the next 1-3 weeks.  Please call us at [phone] if you have not heard about the biopsies in 3 weeks.    SIGNATURES/CONFIDENTIALITY: You and/or your care partner have signed paperwork which will be entered into your electronic medical record.  These signatures attest to the fact that that the information above on your After Visit Summary has been reviewed and is understood.  Full responsibility of the confidentiality of this discharge information lies with you and/or your care-partner.  Please read over handout about polyps  Please continue your normal medications   

## 2015-03-04 NOTE — Progress Notes (Signed)
To recovery, report to [place], RN, VSS 

## 2015-03-07 ENCOUNTER — Telehealth: Payer: Self-pay | Admitting: *Deleted

## 2015-03-07 NOTE — Telephone Encounter (Signed)
  Follow up Call-  Call back number 03/04/2015  Post procedure Call Back phone  # [phone]  Permission to leave phone message Yes     Patient questions:  Do you have a fever, pain , or abdominal swelling? No. Pain Score  0 *  Have you tolerated food without any problems? Yes.    Have you been able to return to your normal activities? Yes.    Do you have any questions about your discharge instructions: Diet   No. Medications  No. Follow up visit  No.  Do you have questions or concerns about your Care? No.  Actions: * If pain score is 4 or above: No action needed, pain <4.

## 2015-03-09 ENCOUNTER — Encounter: Payer: Self-pay | Admitting: Gastroenterology

## 2015-03-14 ENCOUNTER — Ambulatory Visit (INDEPENDENT_AMBULATORY_CARE_PROVIDER_SITE_OTHER)
Admission: RE | Admit: 2015-03-14 | Discharge: 2015-03-14 | Disposition: A | Discharge status: Home / Self Care | Payer: BLUE CROSS/BLUE SHIELD | Source: Ambulatory Visit | Attending: Gastroenterology | Admitting: Gastroenterology

## 2015-03-14 ENCOUNTER — Telehealth: Payer: Self-pay | Admitting: Gastroenterology

## 2015-03-14 DIAGNOSIS — R109 Unspecified abdominal pain: Secondary | ICD-10-CM

## 2015-03-14 NOTE — Telephone Encounter (Signed)
Left message on machine to call back Xray in [facility]

## 2015-03-14 NOTE — Telephone Encounter (Signed)
Pt has been notified and will come in for x ray

## 2015-03-14 NOTE — Telephone Encounter (Signed)
Lets get her in for xrays (plain abd, flat and upright).  thanks

## 2015-03-14 NOTE — Telephone Encounter (Signed)
Pt had colonoscopy 03/04/15 and since she has had gas pain that is pretty intense.  She has used simethicone 4 per day since Saturday with some relief but it continues to hurt off/on.  Bowel movements are normal for her.  No other symptoms.

## 2015-03-15 ENCOUNTER — Other Ambulatory Visit (INDEPENDENT_AMBULATORY_CARE_PROVIDER_SITE_OTHER): Payer: BLUE CROSS/BLUE SHIELD

## 2015-03-15 ENCOUNTER — Telehealth: Payer: Self-pay

## 2015-03-15 DIAGNOSIS — R109 Unspecified abdominal pain: Secondary | ICD-10-CM

## 2015-03-15 LAB — CBC WITH DIFFERENTIAL/PLATELET
BASOS ABS: 0.1 10*3/uL (ref 0.0–0.1)
Basophils Relative: 0.6 % (ref 0.0–3.0)
EOS ABS: 0.4 10*3/uL (ref 0.0–0.7)
Eosinophils Relative: 3.5 % (ref 0.0–5.0)
HEMATOCRIT: 37.9 % (ref 36.0–46.0)
Hemoglobin: 12.9 g/dL (ref 12.0–15.0)
LYMPHS ABS: 2.5 10*3/uL (ref 0.7–4.0)
LYMPHS PCT: 23.2 % (ref 12.0–46.0)
MCHC: 34 g/dL (ref 30.0–36.0)
MCV: 90.2 fl (ref 78.0–100.0)
MONO ABS: 0.4 10*3/uL (ref 0.1–1.0)
Monocytes Relative: 4 % (ref 3.0–12.0)
NEUTROS ABS: 7.5 10*3/uL (ref 1.4–7.7)
NEUTROS PCT: 68.7 % (ref 43.0–77.0)
PLATELETS: 333 10*3/uL (ref 150.0–400.0)
RBC: 4.2 Mil/uL (ref 3.87–5.11)
RDW: 12.3 % (ref 11.5–15.5)
WBC: 10.9 10*3/uL — ABNORMAL HIGH (ref 4.0–10.5)

## 2015-03-15 NOTE — Telephone Encounter (Signed)
Pt has been notified and will have labs tonight and will come in at 8:15 am tomorrow for appt.

## 2015-03-15 NOTE — Telephone Encounter (Signed)
Pt states the abd pain continues and is more intense as the day goes on.  She wants to know if it could be gas?

## 2015-03-15 NOTE — Telephone Encounter (Signed)
Left message on machine to call back  

## 2015-03-15 NOTE — Telephone Encounter (Signed)
Definitely could be gas pains.  I'd like to see her tomorrow in office, double book please.  CBC in early AM.  Thanks

## 2015-03-16 ENCOUNTER — Encounter: Payer: Self-pay | Admitting: Gastroenterology

## 2015-03-16 ENCOUNTER — Ambulatory Visit (INDEPENDENT_AMBULATORY_CARE_PROVIDER_SITE_OTHER): Payer: BLUE CROSS/BLUE SHIELD | Admitting: Gastroenterology

## 2015-03-16 VITALS — BP 124/72 | HR 80 | Ht 63.75 in | Wt 235.1 lb

## 2015-03-16 DIAGNOSIS — R109 Unspecified abdominal pain: Secondary | ICD-10-CM | POA: Diagnosis not present

## 2015-03-16 MED ORDER — HYOSCYAMINE SULFATE 0.125 MG SL SUBL: 0.1250 mg | SUBLINGUAL_TABLET | Freq: Four times a day (QID) | SUBLINGUAL | Status: DC | PRN

## 2015-03-16 MED ORDER — METRONIDAZOLE 500 MG PO TABS: 500.0000 mg | ORAL_TABLET | Freq: Three times a day (TID) | ORAL | Status: DC

## 2015-03-16 MED ORDER — CIPROFLOXACIN HCL 500 MG PO TABS: 500.0000 mg | ORAL_TABLET | Freq: Two times a day (BID) | ORAL | Status: DC

## 2015-03-16 NOTE — Progress Notes (Signed)
HPI: This is a  very pleasant 49 year old woman  whom I last saw the time of colonoscopy 12 days ago.  Chief complaint is abdominal pain after colonoscopy  About 10 days ago I did colonoscopy for her for routine screening and found to 16 mm semi-pedunculated polyp in the transverse colon. This was removed by snare cautery. This was tubulovillous adenoma in our he recommended she have a repeat colonoscopy at 3 year interval.  Gradually worsening abd pains starting about 3 days after the colonoscoy.  The pain woke her up at night. She had plain films 2 days ago and they were normal flat and upright. She had a CBC done yesterday afternoon and her white blood cell count was 10.2 thousand.  Overall the past 3-4 days the pain has been improving quite a bit. It does not limit her. She is eating normally. She has a little bit of constipation. She describes the pain is intermittent spasms. She has had no fevers or chills  Review of systems: Pertinent positive and negative review of systems were noted in the above HPI section. Complete review of systems was performed and was otherwise normal.   Past Medical History  Diagnosis Date  . Hypertension   . Allergic rhinitis   . Anxiety   . Arthritis     feet, rt knee  . Depression   . Heart murmur   . Thyroid goiter     Past Surgical History  Procedure Laterality Date  . Laparoscopic gastric banding  05-23-09    dr Abran Cantor  . Liposuction  1995    Current Outpatient Prescriptions  Medication Sig Dispense Refill  . ALPRAZolam (XANAX) 0.25 MG tablet Take 1 tablet (0.25 mg total) by mouth 3 (three) times daily as needed for anxiety. 60 tablet 2  . Cholecalciferol (VITAMIN D) 2000 UNITS CAPS Take by mouth daily.    . hydrochlorothiazide (HYDRODIURIL) 25 MG tablet Take 1 tablet (25 mg total) by mouth daily. 30 tablet 11  . levothyroxine (SYNTHROID, LEVOTHROID) 50 MCG tablet Take 50 mcg by mouth daily before breakfast.    . metoprolol succinate  (TOPROL-XL) 25 MG 24 hr tablet Take 1 tablet (25 mg total) by mouth daily. 30 tablet 2  . valACYclovir (VALTREX) 1000 MG tablet Take 500 mg by mouth daily.     . Vitamin D, Ergocalciferol, (DRISDOL) 50000 UNITS CAPS capsule Take 1 capsule (50,000 Units total) by mouth every 7 (seven) days. 4 capsule 3  . traMADol (ULTRAM) 50 MG tablet Take 1 tablet (50 mg total) by mouth every 6 (six) hours as needed for moderate pain. (Patient not taking: Reported on 03/16/2015) 30 tablet 2   No current facility-administered medications for this visit.    Allergies as of 03/16/2015  . (No Known Allergies)    Family History  Problem Relation Age of Onset  . Hypertension Mother   . Aneurysm Mother 54    brain  . Hypertension Father   . Hypertension Maternal Grandmother   . Hypertension Paternal Grandmother   . Heart disease Paternal Grandmother 33    MI  . Diabetes Paternal Aunt   . Heart disease Maternal Grandfather     MI  . Alzheimer's disease Paternal Grandfather   . Colon cancer Neg Hx   . Esophageal cancer Neg Hx   . Rectal cancer Neg Hx   . Stomach cancer Neg Hx     Social History   Social History  . Marital Status: Single    Spouse Name:  N/A  . Number of Children: N/A  . Years of Education: N/A   Occupational History  . [employer]-- lab assistant    Social History Main Topics  . Smoking status: Never Smoker   . Smokeless tobacco: Never Used  . Alcohol Use: 4.8 oz/week    8 Glasses of wine per week  . Drug Use: No  . Sexual Activity: No   Other Topics Concern  . Not on file   Social History Narrative     Physical Exam: BP 124/72 mmHg  Pulse 80  Ht 5' 3.75" (1.619 m)  Wt 235 lb 2 oz (106.652 kg)  BMI 40.69 kg/m2  LMP 03/05/2015 Constitutional: generally well-appearing Psychiatric: alert and oriented x3 Eyes: extraocular movements intact Mouth: oral pharynx moist, no lesions Neck: supple no lymphadenopathy Cardiovascular: heart regular rate and rhythm Lungs:  clear to auscultation bilaterally Abdomen: soft, nontender, nondistended, no obvious ascites, no peritoneal signs, normal bowel sounds Extremities: no lower extremity edema bilaterally Skin: no lesions on visible extremities   Assessment and plan: 49 y.o. 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 Examination she is not tender at all and she's had no fevers.  In the past 2-3 days her pains have improved but she still does have intermittent spasms. I'm giving her antispasm medicine sublingual to take on an as-needed basis. I'm also calling her in Cipro and Flagyl and she knows to pick it up if she has fevers or chills at any time or if the pains begin to worsen. I also asked her to call this office if any of that happens. I think probably she had post polypectomy syndrome after the cautery snare removal of polyp but it seems that her symptoms are easing up already Hesitant to Start Antibiotics at This Time Unless She Worsens.   Owens Loffler, MD [facility] 03/16/2015, 9:00 AM  Cc: Rosalita Chessman, DO

## 2015-03-16 NOTE — Patient Instructions (Addendum)
Cipro 500mg  twice daily called in (5 days), flagyl 500mg  three times daily called in (5 days).  Please start these if your pains worsen of if any fevers, chills. Also call my office if that is the case. Also levsin 0.125mg  SL, take one to 2 every 5-6 hours as needed for spasm pains.

## 2015-03-22 ENCOUNTER — Telehealth: Payer: Self-pay | Admitting: Gastroenterology

## 2015-03-22 DIAGNOSIS — R109 Unspecified abdominal pain: Secondary | ICD-10-CM

## 2015-03-22 DIAGNOSIS — D126 Benign neoplasm of colon, unspecified: Secondary | ICD-10-CM | POA: Insufficient documentation

## 2015-03-22 NOTE — Telephone Encounter (Signed)
Yes, she needs a CT scan abd/pelvis with IV and po contrast; pain after colonsocopy

## 2015-03-22 NOTE — Telephone Encounter (Signed)
The patient has been notified of this information and all questions answered.  She will pick up the contrast at the front desk by 5 pm today     You have been scheduled for a CT scan of the abdomen and pelvis at [facility] ([address]---this is in the same building as [facility]).   You are scheduled on 03/23/15 at 1 pm. You should arrive 15 minutes prior to your appointment time for registration. Please follow the written instructions below on the day of your exam:  WARNING: IF YOU ARE ALLERGIC TO IODINE/X-RAY DYE, PLEASE NOTIFY RADIOLOGY IMMEDIATELY AT [phone]! YOU WILL BE GIVEN A 13 HOUR PREMEDICATION PREP.  1) Do not eat or drink anything after 9 am (4 hours prior to your test) 2) You have been given 2 bottles of oral contrast to drink. The solution may taste better if refrigerated, but do NOT add ice or any other liquid to this solution. Shake well before drinking.    Drink 1 bottle of contrast @ 11 am (2 hours prior to your exam)  Drink 1 bottle of contrast @ 12 pm (1 hour prior to your exam)  You may take any medications as prescribed with a small amount of water except for the following: Metformin, Glucophage, Glucovance, Avandamet, Riomet, Fortamet, Actoplus Met, Janumet, Glumetza or Metaglip. The above medications must be held the day of the exam AND 48 hours after the exam.  The purpose of you drinking the oral contrast is to aid in the visualization of your intestinal tract. The contrast solution may cause some diarrhea. Before your exam is started, you will be given a small amount of fluid to drink. Depending on your individual set of symptoms, you may also receive an intravenous injection of x-ray contrast/dye. Plan on being at [facility] for 30 minutes or longer, depending on the type of exam you are having performed.  This test typically takes 30-45 minutes to complete.  If you have any questions regarding your exam or if you need to  reschedule, you may call the CT department at [phone] between the hours of 8:00 am and 5:00 pm, Monday-Friday.  ________________________________________________________________________

## 2015-03-22 NOTE — Telephone Encounter (Signed)
Dr Jac Canavan for your review

## 2015-03-23 ENCOUNTER — Encounter (HOSPITAL_COMMUNITY): Payer: Self-pay | Admitting: Physician Assistant

## 2015-03-23 ENCOUNTER — Emergency Department (HOSPITAL_COMMUNITY)
Admission: EM | Admit: 2015-03-23 | Discharge: 2015-03-23 | Disposition: A | Discharge status: Home / Self Care | Payer: BLUE CROSS/BLUE SHIELD | Attending: Emergency Medicine | Admitting: Emergency Medicine

## 2015-03-23 ENCOUNTER — Ambulatory Visit (INDEPENDENT_AMBULATORY_CARE_PROVIDER_SITE_OTHER)
Admission: RE | Admit: 2015-03-23 | Discharge: 2015-03-23 | Disposition: A | Discharge status: Home / Self Care | Payer: BLUE CROSS/BLUE SHIELD | Source: Ambulatory Visit | Attending: Gastroenterology | Admitting: Gastroenterology

## 2015-03-23 ENCOUNTER — Other Ambulatory Visit: Payer: Self-pay | Admitting: Surgery

## 2015-03-23 DIAGNOSIS — R109 Unspecified abdominal pain: Secondary | ICD-10-CM | POA: Diagnosis present

## 2015-03-23 DIAGNOSIS — Z79899 Other long term (current) drug therapy: Secondary | ICD-10-CM | POA: Insufficient documentation

## 2015-03-23 DIAGNOSIS — I10 Essential (primary) hypertension: Secondary | ICD-10-CM | POA: Diagnosis not present

## 2015-03-23 DIAGNOSIS — F418 Other specified anxiety disorders: Secondary | ICD-10-CM | POA: Diagnosis not present

## 2015-03-23 DIAGNOSIS — K561 Intussusception: Secondary | ICD-10-CM | POA: Insufficient documentation

## 2015-03-23 DIAGNOSIS — Z86010 Personal history of colonic polyps: Secondary | ICD-10-CM | POA: Diagnosis not present

## 2015-03-23 DIAGNOSIS — Z792 Long term (current) use of antibiotics: Secondary | ICD-10-CM | POA: Insufficient documentation

## 2015-03-23 DIAGNOSIS — E049 Nontoxic goiter, unspecified: Secondary | ICD-10-CM | POA: Diagnosis not present

## 2015-03-23 DIAGNOSIS — R011 Cardiac murmur, unspecified: Secondary | ICD-10-CM | POA: Insufficient documentation

## 2015-03-23 DIAGNOSIS — Z8739 Personal history of other diseases of the musculoskeletal system and connective tissue: Secondary | ICD-10-CM | POA: Insufficient documentation

## 2015-03-23 HISTORY — DX: Polyp of colon: K63.5

## 2015-03-23 HISTORY — DX: Other specified anxiety disorders: F41.8

## 2015-03-23 HISTORY — DX: Morbid (severe) obesity due to excess calories: E66.01

## 2015-03-23 MED ORDER — IOHEXOL 300 MG/ML  SOLN
100.0000 mL | Freq: Once | INTRAMUSCULAR | Status: AC | PRN
  Administered 2015-03-23: 100 mL via INTRAVENOUS

## 2015-03-23 NOTE — Progress Notes (Signed)
Pt seen and spoken to in ER triage area.  Pain in abdomen comes and goes. 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  Have placed call to surgical PA.    Urbano Heir PA-C   Addendum: Patient still in ER waiting room, waiting for ER bed. Surgery to see patient based on CT results.   [place] Cellar, MD [facility] Pager [phone]

## 2015-03-23 NOTE — ED Provider Notes (Signed)
CSN: [id number]     Arrival date & time 03/23/15  1556 History   First MD Initiated Contact with Patient 03/23/15 1802     Chief Complaint  Patient presents with  . Abdominal Pain     (Consider location/radiation/quality/duration/timing/severity/associated sxs/prior Treatment) The history is provided by the patient.  Kelly [name] is a 49 y.o. female hx HTN, here with abdominal pain. Intermittent abdominal pain for the last several weeks. Had a colonoscopy about 3 weeks ago and had some polyps removed. Has intermittent pain since then. She saw gastroenterology in the meantime was thought to have gas. She still has persistent pain to CT abdomen and pelvis was ordered and was done today that showed intussusception with a leak point from a lipoma. Patient states that she is not vomiting and has been passing gas. She just has intermittent pain. Patient has already been seen by GI PA as well as Dr. Lucia Gaskins from surgery. She is scheduled for surgery in 2 days.    Past Medical History  Diagnosis Date  . Hypertension   . Allergic rhinitis   . Arthritis     feet, rt knee  . Depression with anxiety   . Heart murmur   . Thyroid goiter   . 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   . Morbid obesity ([place]) 2009  . 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 Laparoscopic gastric banding  05-23-09    dr Abran Cantor with hiatal hernia repair.   . Liposuction  1995   Family History  Problem Relation Age of Onset  . Hypertension Mother   . Aneurysm Mother 21    brain  . Hypertension Father   . Hypertension Maternal Grandmother   . Hypertension Paternal Grandmother   . Heart disease Paternal Grandmother 8    MI  . Diabetes Paternal Aunt   . Heart disease Maternal Grandfather     MI  . Alzheimer's disease Paternal Grandfather   . Colon cancer Neg Hx   . Esophageal cancer Neg Hx   .  Rectal cancer Neg Hx   . Stomach cancer Neg Hx    Social History  Substance Use Topics  . Smoking status: Never Smoker   . Smokeless tobacco: Never Used  . Alcohol Use: 4.8 oz/week    8 Glasses of wine per week   OB History    No data available     Review of Systems  Gastrointestinal: Positive for abdominal pain.  All other systems reviewed and are negative.     Allergies  Review of patient's allergies indicates no known allergies.  Home Medications   Prior to Admission medications   Medication Sig Start Date End Date Taking? Authorizing Provider  ALPRAZolam (XANAX) 0.25 MG tablet Take 1 tablet (0.25 mg total) by mouth 3 (three) times daily as needed for anxiety. 01/07/15   Rosalita Chessman, DO  Cholecalciferol (VITAMIN D) 2000 UNITS CAPS Take by mouth daily.    Historical Provider, MD  ciprofloxacin (CIPRO) 500 MG tablet Take 1 tablet (500 mg total) by mouth 2 (two) times daily. 03/16/15   Milus Banister, MD  hydrochlorothiazide (HYDRODIURIL) 25 MG tablet Take 1 tablet (25 mg total) by mouth daily. 01/07/15   Rosalita Chessman, DO  hyoscyamine (LEVSIN SL) 0.125 MG SL tablet Place 1 tablet (0.125 mg total) under the tongue every 6 (six) hours as needed for  cramping. 03/16/15   Milus Banister, MD  levothyroxine (SYNTHROID, LEVOTHROID) 50 MCG tablet Take 50 mcg by mouth daily before breakfast.    Historical Provider, MD  metoprolol succinate (TOPROL-XL) 25 MG 24 hr tablet Take 1 tablet (25 mg total) by mouth daily. 11/16/14   Rosalita Chessman, DO  metroNIDAZOLE (FLAGYL) 500 MG tablet Take 1 tablet (500 mg total) by mouth 3 (three) times daily. 03/16/15   Milus Banister, MD  traMADol (ULTRAM) 50 MG tablet Take 1 tablet (50 mg total) by mouth every 6 (six) hours as needed for moderate pain. Patient not taking: Reported on 03/16/2015 01/07/15   Rosalita Chessman, DO  valACYclovir (VALTREX) 1000 MG tablet Take 500 mg by mouth daily.  12/18/10   Historical Provider, MD  Vitamin D,  Ergocalciferol, (DRISDOL) 50000 UNITS CAPS capsule Take 1 capsule (50,000 Units total) by mouth every 7 (seven) days. 01/17/15   Rhandi R Lowne, DO   BP 143/60 mmHg  Pulse 100  Temp(Src) 98.6 F (37 C) (Oral)  Resp 20  SpO2 99%  LMP 03/05/2015 (Approximate) Physical Exam  Constitutional: She is oriented to person, place, and time. She appears well-developed and well-nourished.  HENT:  Head: Normocephalic.  Mouth/Throat: Oropharynx is clear and moist.  Eyes: Conjunctivae are normal. Pupils are equal, round, and reactive to light.  Neck: Normal range of motion. Neck supple.  Cardiovascular: Normal rate, regular rhythm and normal heart sounds.   Pulmonary/Chest: Effort normal and breath sounds normal. No respiratory distress. She has no wheezes. She has no rales.  Abdominal: Soft. Bowel sounds are normal.  Mild epigastric tenderness, no rebound   Musculoskeletal: Normal range of motion. She exhibits no edema or tenderness.  Neurological: She is alert and oriented to person, place, and time.  Skin: Skin is warm and dry.  Psychiatric: She has a normal mood and affect. Her behavior is normal. Judgment and thought content normal.  Nursing note and vitals reviewed.   ED Course  Procedures (including critical care time) Labs Review Labs Reviewed - No data to display  Imaging Review Ct Abdomen Pelvis W Contrast  03/23/2015  ADDENDUM REPORT: 03/23/2015 15:03 ADDENDUM: The original report was by Dr. Van Clines. The following addendum is by Dr. Van Clines: These results were called by telephone at the time of interpretation on 03/23/2015 at 2:58 pm to Dr. Owens Loffler , who verbally acknowledged these results. Electronically Signed   By: Van Clines M.D.   On: 03/23/2015 15:03  03/23/2015  CLINICAL DATA:  Mid abdominal pain starting March 07, 2015. Lap band in 2011. Prior colonoscopy 03/04/2015 with transverse colon polypectomy EXAM: CT ABDOMEN AND PELVIS WITH  CONTRAST TECHNIQUE: Multidetector CT imaging of the abdomen and pelvis was performed using the standard protocol following bolus administration of intravenous contrast. CONTRAST:  114mL OMNIPAQUE IOHEXOL 300 MG/ML  SOLN COMPARISON:  03/14/2015 FINDINGS: Lower chest:  Unremarkable Hepatobiliary: Several small hypodense hepatic lesions are present and highly likely to be benign anteriorly in the left hepatic lobe. The largest of these is 9 mm in diameter, image 16 series 2, fluid density. Mildly contracted gallbladder. Pancreas: Unremarkable Spleen: Unremarkable Adrenals/Urinary Tract: Unremarkable Stomach/Bowel: Lap band observed, 1:30-6:30 orientation, continuous tubing to the port. Ileocolic intussusception to the level of the proximal transverse colon, where there is a fatty 5 cm masslike lesion which may have been included with the intussusception, versus an actual lipoma of the transverse colon. The appendix is normal. Vascular/Lymphatic: Unremarkable Reproductive: Uterine length 13.4 cm,  with several adjacent posterior uterine body and fundal fibroids, intramural in position, measuring up to 5.2 cm. Endometrial stripe normal. Suspected 2.4 cm cyst of the left ovary. Other: No supplemental non-categorized findings. Musculoskeletal: Unremarkable IMPRESSION: 1. Ileocolic intussusception with ileum extending through the ascending colon to the proximal transverse colon. There is a 5 cm fatty mass within the transverse colon, differential diagnostic considerations include large transverse colon lipoma versus fatty mass included with the intussusceptum. The intussusception does not appear to extend beyond this fatty mass. No dilated bowel to suggest current bowel obstruction. 2. The lap band appears well positioned and oriented. 3. Several small hypodense hepatic lesions are too small to characterize but likely benign. 4. Posterior intramural uterine fibroids.  Small left ovarian cyst. Electronically Signed: By:  Van Clines M.D. On: 03/23/2015 14:20   I have personally reviewed and evaluated these images and lab results as part of my medical decision-making.   EKG Interpretation None      MDM   Final diagnoses:  None    Kelly [name] is a 49 y.o. female here with ab pain. Patient seen by surgery prior to my eval. Has intussusception likely from lipoma. No signs of SBO. Vitals stable. Scheduled for surgery in 2 days. Will dc home.     Wandra Arthurs, MD 03/23/15 Tresa Moore

## 2015-03-23 NOTE — ED Notes (Signed)
Dr. Lucia Gaskins is here seeing her as I write this. She remains in no distress.

## 2015-03-23 NOTE — ED Notes (Signed)
Dr. Lucia Gaskins has told me he plans to d/c pt. And will perform operation this Friday.  He also informed me he hand-wrote a prescription and has given it to her; and further that his office will call her sometime tomorrow to inform her of when to be here for her operation.  I have enlisted the help of Dr. Darl Householder to create appropriate d/c instructions.

## 2015-03-23 NOTE — Discharge Instructions (Signed)
Take miralax as prescribed by surgery tomorrow.   Surgery office should call you tomorrow regarding surgical schedule.   Return to ER if you have severe abdominal pain, vomiting, fever, not passing gas.

## 2015-03-23 NOTE — ED Notes (Signed)
She c/o nagging abd. Discomfort since having colonoscopy with polypectomy ~2 weeks ago.  She underwent CT today which showed terminal illeum/cecum area intussusception.  She is in no distress and as I write this the gastroenterology p.a., Judson Roch has spoken with him; and she states that the on-call surgeon is to see her.

## 2015-03-23 NOTE — Consult Note (Signed)
Re:   Kelly [name] DOB:   06-Apr-1965 MRN:   [id number]  [facility] consultation/admission  ASSESSMENT AND PLAN: 1.  Intussusception of ileum into cecum  She is not acutely ill and this is causing low grade symptoms.  But I think that she will need a right colectomy to resect this probably benign mass and prevent further intussusceptions.  She wants to go home.  I have given her a bowel prep and told her to stay on clear liquids.  I will plan her surgery for Friday, 12/30.  I reviewed with the patient the findings and need for colon surgery.  I discussed both surgical and non surgical options.  I discussed the role of laparoscopic and open surgery in colon surgery.  I reviewed the risks of surgery, including, but not limited to, infection, bleeding, nerve injury, anastomotic leaks, and possibility of colostomy.  I went over the preoperative mechanical and antibiotic bowel prep for colon surgery and provided prescriptions for these.  2.  Morbid obesity  Weight - 233, BMI - 40  She has been in discussion with Dr. Excell Seltzer about revising the lap band to another surgical procedure. 3.  Lap band (with Dovray repair)  05/23/2009 - [facility] 4.  HTN 5.  Depression/anxiety 6.  Hypothyroid - on replacement  Seen by Dr. Elyse Hsu  Chief Complaint  Patient presents with  . Abdominal Pain   REFERRING PHYSICIAN: [name], DO  HISTORY OF PRESENT ILLNESS: Kelly [name] is a 49 y.o. (DOB: [birth year])  AA  female whose primary care physician is [name], DO and comes to the [facility] ER today for abdominal pain.  She underwent a recent colonoscopy by Dr. Rande Brunt on around 03/04/2015.  She had this because she turned 49.   She was found to have a benign polyp of the transverse colon.  She even saw Dr. Excell Seltzer in the interim for discussion of her lap band surgery. About a week after the colonoscopy, she has had some mid epigastric pain.  She has the pain most of the day, but it seems better at times and  worse at times.  She saw Dr. Ardis Hughs back on 03/16/2015 - a KUB was unremkable and her WBC was normal.  The pain persisted and was worse in 03/19/2015.  Because of the continued pain, she had an abdominal CT scan today.  The abdominal/pelvis CT scan showed - 1. Ileocolic intussusception with ileum extending through the ascending colon to the proximal transverse colon. There is a 5 cm fatty mass within the transverse colon, differential diagnostic considerations include large transverse colon lipoma versus fatty mass included with the intussusceptum. The intussusception does not appear to extend beyond this fatty mass.  2. The lap band appears well positioned and oriented.  3. Several small hypodense hepatic lesions are too small to characterize but likely benign.  4. Posterior intramural uterine fibroids. Small left ovarian cyst.  She has no GI history.  No history of stomach, liver, pancreas, or colon disease.  Her only prior surgery was the lap band in 2011.    Past Medical History  Diagnosis Date  . Hypertension   . Allergic rhinitis   . Arthritis     feet, rt knee  . Depression with anxiety   . Heart murmur   . Thyroid goiter   . 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   . Morbid obesity ([place]) 2009  . 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 Laparoscopic gastric banding  05-23-09    dr Abran Cantor with hiatal hernia repair.   . Liposuction  1995      No current facility-administered medications for this encounter.   Current Outpatient Prescriptions  Medication Sig Dispense Refill  . ALPRAZolam (XANAX) 0.25 MG tablet Take 1 tablet (0.25 mg total) by mouth 3 (three) times daily as needed for anxiety. 60 tablet 2  . Cholecalciferol (VITAMIN D) 2000 UNITS CAPS Take by mouth daily.    . ciprofloxacin (CIPRO) 500 MG tablet Take 1 tablet (500 mg total) by mouth 2 (two)  times daily. 10 tablet 0  . hydrochlorothiazide (HYDRODIURIL) 25 MG tablet Take 1 tablet (25 mg total) by mouth daily. 30 tablet 11  . hyoscyamine (LEVSIN SL) 0.125 MG SL tablet Place 1 tablet (0.125 mg total) under the tongue every 6 (six) hours as needed for cramping. 40 tablet 0  . levothyroxine (SYNTHROID, LEVOTHROID) 50 MCG tablet Take 50 mcg by mouth daily before breakfast.    . metoprolol succinate (TOPROL-XL) 25 MG 24 hr tablet Take 1 tablet (25 mg total) by mouth daily. 30 tablet 2  . metroNIDAZOLE (FLAGYL) 500 MG tablet Take 1 tablet (500 mg total) by mouth 3 (three) times daily. 15 tablet 0  . traMADol (ULTRAM) 50 MG tablet Take 1 tablet (50 mg total) by mouth every 6 (six) hours as needed for moderate pain. (Patient not taking: Reported on 03/16/2015) 30 tablet 2  . valACYclovir (VALTREX) 1000 MG tablet Take 500 mg by mouth daily.     . Vitamin D, Ergocalciferol, (DRISDOL) 50000 UNITS CAPS capsule Take 1 capsule (50,000 Units total) by mouth every 7 (seven) days. 4 capsule 3     No Known Allergies  REVIEW OF SYSTEMS: Skin:  No history of rash.  No history of abnormal moles. Infection:  No history of hepatitis or HIV.  No history of MRSA. Neurologic:  No history of stroke.  No history of seizure.  No history of headaches. Cardiac:  HTN Pulmonary:  Does not smoke cigarettes.  No asthma or bronchitis.  No OSA/CPAP.  Endocrine:  No diabetes. Followed by Dr. Elyse Hsu for hypothyroidism. Gastrointestinal:  See HPI. Urologic:  No history of kidney stones.  No history of bladder infections. Musculoskeletal:  No history of joint or back disease. Hematologic:  No bleeding disorder.  No history of anemia.  Not anticoagulated. Psycho-social:  The patient is oriented.   The patient has no obvious psychologic or social impairment to understanding our conversation and plan.  SOCIAL and FAMILY HISTORY:  Unmarried.  No children. 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 Her parents live in  [place], [place]: BP 143/60 mmHg  Pulse 100  Temp(Src) 98.6 F (37 C) (Oral)  Resp 20  SpO2 99%  LMP 03/05/2015 (Approximate)  General: WN AA obese F who is alert and generally healthy appearing.  HEENT: Normal. Pupils equal. Neck: Supple. No mass.  No thyroid mass. Lymph Nodes:  No supraclavicular or cervical nodes. Lungs: Clear to auscultation and symmetric breath sounds. Heart:  RRR. No murmur or rub. Abdomen: Soft. No mass. No hernia. Normal bowel sounds.   Port in [place].  Sore in her epigastrium, but no peritoneal signs. Rectal: Not done. Extremities:  Good strength and ROM  in upper and lower extremities. Neurologic:  Grossly intact to motor and sensory function. Psychiatric: Has normal mood and affect. Behavior is  normal.   DATA REVIEWED: Epic notes.  Alphonsa Overall, MD,  [facility] Surgery, [address]  [place], [place]    [place] Phone:  [phone] FAX:  [phone]

## 2015-03-24 ENCOUNTER — Encounter (HOSPITAL_COMMUNITY): Payer: Self-pay | Admitting: *Deleted

## 2015-03-24 NOTE — Progress Notes (Signed)
Requested LOV note and EKG from office of Dr Einar Gip.

## 2015-03-25 ENCOUNTER — Encounter (HOSPITAL_COMMUNITY): Payer: Self-pay | Admitting: *Deleted

## 2015-03-25 ENCOUNTER — Inpatient Hospital Stay (HOSPITAL_COMMUNITY)
Admission: RE | Admit: 2015-03-25 | Discharge: 2015-03-30 | DRG: 331 | Disposition: A | Discharge status: Home / Self Care | Payer: BLUE CROSS/BLUE SHIELD | Source: Ambulatory Visit | Attending: Surgery | Admitting: Surgery

## 2015-03-25 ENCOUNTER — Encounter (HOSPITAL_COMMUNITY): Admission: RE | Disposition: A | Discharge status: Home / Self Care | Payer: Self-pay | Source: Ambulatory Visit

## 2015-03-25 ENCOUNTER — Inpatient Hospital Stay (HOSPITAL_COMMUNITY): Payer: BLUE CROSS/BLUE SHIELD | Admitting: Anesthesiology

## 2015-03-25 DIAGNOSIS — N83209 Unspecified ovarian cyst, unspecified side: Secondary | ICD-10-CM | POA: Diagnosis present

## 2015-03-25 DIAGNOSIS — K561 Intussusception: Secondary | ICD-10-CM | POA: Diagnosis present

## 2015-03-25 DIAGNOSIS — D251 Intramural leiomyoma of uterus: Secondary | ICD-10-CM | POA: Diagnosis present

## 2015-03-25 DIAGNOSIS — F329 Major depressive disorder, single episode, unspecified: Secondary | ICD-10-CM | POA: Diagnosis present

## 2015-03-25 DIAGNOSIS — E039 Hypothyroidism, unspecified: Secondary | ICD-10-CM | POA: Diagnosis present

## 2015-03-25 DIAGNOSIS — M1990 Unspecified osteoarthritis, unspecified site: Secondary | ICD-10-CM | POA: Diagnosis present

## 2015-03-25 DIAGNOSIS — Z79899 Other long term (current) drug therapy: Secondary | ICD-10-CM

## 2015-03-25 DIAGNOSIS — F419 Anxiety disorder, unspecified: Secondary | ICD-10-CM | POA: Diagnosis present

## 2015-03-25 DIAGNOSIS — Z6839 Body mass index (BMI) 39.0-39.9, adult: Secondary | ICD-10-CM | POA: Diagnosis not present

## 2015-03-25 DIAGNOSIS — I10 Essential (primary) hypertension: Secondary | ICD-10-CM | POA: Diagnosis present

## 2015-03-25 DIAGNOSIS — Z86010 Personal history of colonic polyps: Secondary | ICD-10-CM | POA: Diagnosis not present

## 2015-03-25 DIAGNOSIS — D175 Benign lipomatous neoplasm of intra-abdominal organs: Secondary | ICD-10-CM | POA: Diagnosis present

## 2015-03-25 DIAGNOSIS — Z9884 Bariatric surgery status: Secondary | ICD-10-CM | POA: Diagnosis not present

## 2015-03-25 HISTORY — DX: Atrial premature depolarization: I49.1

## 2015-03-25 HISTORY — DX: Personal history of other diseases of the digestive system: Z87.19

## 2015-03-25 HISTORY — PX: LAPAROSCOPIC RIGHT HEMI COLECTOMY: SHX5926

## 2015-03-25 HISTORY — DX: Pneumonia, unspecified organism: J18.9

## 2015-03-25 HISTORY — DX: Hypothyroidism, unspecified: E03.9

## 2015-03-25 HISTORY — DX: Other specified postprocedural states: R11.2

## 2015-03-25 HISTORY — DX: Other specified postprocedural states: Z98.890

## 2015-03-25 HISTORY — DX: Ventricular premature depolarization: I49.3

## 2015-03-25 LAB — CBC WITH DIFFERENTIAL/PLATELET
Basophils Absolute: 0.1 10*3/uL (ref 0.0–0.1)
Basophils Relative: 1 %
EOS ABS: 0.1 10*3/uL (ref 0.0–0.7)
Eosinophils Relative: 1 %
HEMATOCRIT: 40.4 % (ref 36.0–46.0)
HEMOGLOBIN: 13.5 g/dL (ref 12.0–15.0)
LYMPHS ABS: 1.9 10*3/uL (ref 0.7–4.0)
LYMPHS PCT: 19 %
MCH: 31 pg (ref 26.0–34.0)
MCHC: 33.4 g/dL (ref 30.0–36.0)
MCV: 92.9 fL (ref 78.0–100.0)
MONOS PCT: 6 %
Monocytes Absolute: 0.6 10*3/uL (ref 0.1–1.0)
NEUTROS ABS: 7 10*3/uL (ref 1.7–7.7)
NEUTROS PCT: 73 %
Platelets: 443 10*3/uL — ABNORMAL HIGH (ref 150–400)
RBC: 4.35 MIL/uL (ref 3.87–5.11)
RDW: 12.2 % (ref 11.5–15.5)
WBC: 9.6 10*3/uL (ref 4.0–10.5)

## 2015-03-25 LAB — COMPREHENSIVE METABOLIC PANEL
ALK PHOS: 93 U/L (ref 38–126)
ALT: 11 U/L — AB (ref 14–54)
AST: 16 U/L (ref 15–41)
Albumin: 3.9 g/dL (ref 3.5–5.0)
Anion gap: 9 (ref 5–15)
BILIRUBIN TOTAL: 1.3 mg/dL — AB (ref 0.3–1.2)
BUN: 8 mg/dL (ref 6–20)
CALCIUM: 9.3 mg/dL (ref 8.9–10.3)
CO2: 26 mmol/L (ref 22–32)
CREATININE: 1.02 mg/dL — AB (ref 0.44–1.00)
Chloride: 105 mmol/L (ref 101–111)
GFR calc non Af Amer: >60 mL/min (ref >60)
GLUCOSE: 105 mg/dL — AB (ref 65–99)
Potassium: 3.8 mmol/L (ref 3.5–5.1)
SODIUM: 140 mmol/L (ref 135–145)
TOTAL PROTEIN: 8.5 g/dL — AB (ref 6.5–8.1)

## 2015-03-25 LAB — HCG, SERUM, QUALITATIVE: Preg, Serum: NEGATIVE

## 2015-03-25 LAB — TYPE AND SCREEN
ABO/RH(D): A POS
Antibody Screen: NEGATIVE

## 2015-03-25 LAB — ABO/RH: ABO/RH(D): A POS

## 2015-03-25 SURGERY — LAPAROSCOPIC RIGHT HEMI COLECTOMY
Anesthesia: General | Laterality: Right

## 2015-03-25 MED ORDER — SUGAMMADEX SODIUM 200 MG/2ML IV SOLN
INTRAVENOUS | Status: AC
  Filled 2015-03-25: qty 2

## 2015-03-25 MED ORDER — DEXAMETHASONE SODIUM PHOSPHATE 10 MG/ML IJ SOLN
INTRAMUSCULAR | Status: AC
  Filled 2015-03-25: qty 1

## 2015-03-25 MED ORDER — ONDANSETRON HCL 4 MG/2ML IJ SOLN
INTRAMUSCULAR | Status: DC | PRN
  Administered 2015-03-25 (×2): 4 mg via INTRAVENOUS

## 2015-03-25 MED ORDER — HEPARIN SODIUM (PORCINE) 5000 UNIT/ML IJ SOLN
5000.0000 [IU] | Freq: Once | INTRAMUSCULAR | Status: AC
  Administered 2015-03-25: 5000 [IU] via SUBCUTANEOUS
  Filled 2015-03-25: qty 1

## 2015-03-25 MED ORDER — SUGAMMADEX SODIUM 200 MG/2ML IV SOLN
INTRAVENOUS | Status: DC | PRN
  Administered 2015-03-25: 200 mg via INTRAVENOUS

## 2015-03-25 MED ORDER — SUCCINYLCHOLINE CHLORIDE 20 MG/ML IJ SOLN
INTRAMUSCULAR | Status: DC | PRN
  Administered 2015-03-25: 100 mg via INTRAVENOUS

## 2015-03-25 MED ORDER — HYDROCODONE-ACETAMINOPHEN 5-325 MG PO TABS
1.0000 | ORAL_TABLET | ORAL | Status: DC | PRN
  Administered 2015-03-26 – 2015-03-28 (×8): 2 via ORAL
  Filled 2015-03-25 (×10): qty 2

## 2015-03-25 MED ORDER — FENTANYL CITRATE (PF) 250 MCG/5ML IJ SOLN
INTRAMUSCULAR | Status: AC
  Filled 2015-03-25: qty 5

## 2015-03-25 MED ORDER — CHLORHEXIDINE GLUCONATE 4 % EX LIQD: 60.0000 mL | Freq: Once | CUTANEOUS | Status: DC

## 2015-03-25 MED ORDER — PROPOFOL 10 MG/ML IV BOLUS
INTRAVENOUS | Status: DC | PRN
  Administered 2015-03-25: 200 mg via INTRAVENOUS

## 2015-03-25 MED ORDER — MIDAZOLAM HCL 5 MG/5ML IJ SOLN
INTRAMUSCULAR | Status: DC | PRN
  Administered 2015-03-25: 2 mg via INTRAVENOUS

## 2015-03-25 MED ORDER — ONDANSETRON HCL 4 MG/2ML IJ SOLN
4.0000 mg | Freq: Four times a day (QID) | INTRAMUSCULAR | Status: DC | PRN
  Administered 2015-03-25: 4 mg via INTRAVENOUS
  Filled 2015-03-25: qty 2

## 2015-03-25 MED ORDER — BUPIVACAINE-EPINEPHRINE 0.25% -1:200000 IJ SOLN
INTRAMUSCULAR | Status: AC
  Filled 2015-03-25: qty 1

## 2015-03-25 MED ORDER — HYDROMORPHONE HCL 1 MG/ML IJ SOLN: 0.2500 mg | INTRAMUSCULAR | Status: DC | PRN

## 2015-03-25 MED ORDER — PROPOFOL 10 MG/ML IV BOLUS
INTRAVENOUS | Status: AC
  Filled 2015-03-25: qty 20

## 2015-03-25 MED ORDER — ALVIMOPAN 12 MG PO CAPS
12.0000 mg | ORAL_CAPSULE | Freq: Two times a day (BID) | ORAL | Status: DC
  Administered 2015-03-26 – 2015-03-29 (×8): 12 mg via ORAL
  Filled 2015-03-25 (×10): qty 1

## 2015-03-25 MED ORDER — MORPHINE SULFATE (PF) 2 MG/ML IV SOLN
1.0000 mg | INTRAVENOUS | Status: DC | PRN
  Administered 2015-03-25 – 2015-03-28 (×7): 2 mg via INTRAVENOUS
  Filled 2015-03-25 (×7): qty 1

## 2015-03-25 MED ORDER — ONDANSETRON HCL 4 MG PO TABS: 4.0000 mg | ORAL_TABLET | Freq: Four times a day (QID) | ORAL | Status: DC | PRN

## 2015-03-25 MED ORDER — LACTATED RINGERS IV SOLN: INTRAVENOUS | Status: DC | PRN

## 2015-03-25 MED ORDER — LACTATED RINGERS IV SOLN: INTRAVENOUS | Status: DC

## 2015-03-25 MED ORDER — MIDAZOLAM HCL 2 MG/2ML IJ SOLN
INTRAMUSCULAR | Status: AC
  Filled 2015-03-25: qty 2

## 2015-03-25 MED ORDER — LACTATED RINGERS IV SOLN
INTRAVENOUS | Status: DC
  Administered 2015-03-25: 14:00:00 via INTRAVENOUS
  Administered 2015-03-25: 1000 mL via INTRAVENOUS
  Administered 2015-03-25: 12:00:00 via INTRAVENOUS

## 2015-03-25 MED ORDER — ROCURONIUM BROMIDE 100 MG/10ML IV SOLN
INTRAVENOUS | Status: DC | PRN
  Administered 2015-03-25 (×2): 10 mg via INTRAVENOUS
  Administered 2015-03-25: 50 mg via INTRAVENOUS

## 2015-03-25 MED ORDER — KETOROLAC TROMETHAMINE 30 MG/ML IJ SOLN
30.0000 mg | Freq: Three times a day (TID) | INTRAMUSCULAR | Status: AC
  Administered 2015-03-25 – 2015-03-26 (×4): 30 mg via INTRAVENOUS
  Filled 2015-03-25 (×5): qty 1

## 2015-03-25 MED ORDER — ALVIMOPAN 12 MG PO CAPS
12.0000 mg | ORAL_CAPSULE | Freq: Once | ORAL | Status: AC
  Administered 2015-03-25: 12 mg via ORAL
  Filled 2015-03-25: qty 1

## 2015-03-25 MED ORDER — HEPARIN SODIUM (PORCINE) 5000 UNIT/ML IJ SOLN
5000.0000 [IU] | Freq: Three times a day (TID) | INTRAMUSCULAR | Status: DC
  Administered 2015-03-25 – 2015-03-30 (×14): 5000 [IU] via SUBCUTANEOUS
  Filled 2015-03-25 (×17): qty 1

## 2015-03-25 MED ORDER — CEFOTETAN DISODIUM 2 G IJ SOLR
2.0000 g | INTRAMUSCULAR | Status: AC
  Administered 2015-03-25: 2 g via INTRAVENOUS
  Filled 2015-03-25: qty 2

## 2015-03-25 MED ORDER — HYDROMORPHONE HCL 1 MG/ML IJ SOLN
INTRAMUSCULAR | Status: DC | PRN
  Administered 2015-03-25 (×2): .2 mg via INTRAVENOUS
  Administered 2015-03-25: .4 mg via INTRAVENOUS

## 2015-03-25 MED ORDER — SCOPOLAMINE 1 MG/3DAYS TD PT72
MEDICATED_PATCH | TRANSDERMAL | Status: AC
  Filled 2015-03-25: qty 1

## 2015-03-25 MED ORDER — BUPIVACAINE-EPINEPHRINE 0.25% -1:200000 IJ SOLN
INTRAMUSCULAR | Status: DC | PRN
  Administered 2015-03-25: 35 mL

## 2015-03-25 MED ORDER — HYDROMORPHONE HCL 2 MG/ML IJ SOLN
INTRAMUSCULAR | Status: AC
  Filled 2015-03-25: qty 1

## 2015-03-25 MED ORDER — ONDANSETRON HCL 4 MG/2ML IJ SOLN
INTRAMUSCULAR | Status: AC
  Filled 2015-03-25: qty 4

## 2015-03-25 MED ORDER — DEXAMETHASONE SODIUM PHOSPHATE 10 MG/ML IJ SOLN
INTRAMUSCULAR | Status: DC | PRN
  Administered 2015-03-25: 5 mg via INTRAVENOUS

## 2015-03-25 MED ORDER — CEFOTETAN DISODIUM-DEXTROSE 2-2.08 GM-% IV SOLR
INTRAVENOUS | Status: AC
  Filled 2015-03-25: qty 50

## 2015-03-25 MED ORDER — KCL IN DEXTROSE-NACL 20-5-0.45 MEQ/L-%-% IV SOLN
INTRAVENOUS | Status: DC
Start: 2015-03-25 — End: 2015-03-30
  Administered 2015-03-25 – 2015-03-30 (×7): via INTRAVENOUS
  Filled 2015-03-25 (×11): qty 1000

## 2015-03-25 MED ORDER — LIDOCAINE HCL (CARDIAC) 20 MG/ML IV SOLN
INTRAVENOUS | Status: DC | PRN
  Administered 2015-03-25: 60 mg via INTRAVENOUS

## 2015-03-25 MED ORDER — FENTANYL CITRATE (PF) 100 MCG/2ML IJ SOLN
INTRAMUSCULAR | Status: DC | PRN
  Administered 2015-03-25 (×2): 50 ug via INTRAVENOUS
  Administered 2015-03-25: 100 ug via INTRAVENOUS
  Administered 2015-03-25: 50 ug via INTRAVENOUS

## 2015-03-25 SURGICAL SUPPLY — 68 items
APPLIER CLIP 5 13 M/L LIGAMAX5 (MISCELLANEOUS)
APPLIER CLIP ROT 10 11.4 M/L (STAPLE)
BLADE EXTENDED COATED 6.5IN (ELECTRODE) IMPLANT
BLADE HEX COATED 2.75 (ELECTRODE) ×3 IMPLANT
CELLS DAT CNTRL 66122 CELL SVR (MISCELLANEOUS) ×1 IMPLANT
CLAMP ENDO BABCK 10MM (STAPLE) IMPLANT
CLIP APPLIE 5 13 M/L LIGAMAX5 (MISCELLANEOUS) IMPLANT
CLIP APPLIE ROT 10 11.4 M/L (STAPLE) IMPLANT
CLOSURE WOUND 1/2 X4 (GAUZE/BANDAGES/DRESSINGS)
CONNECTOR 5 IN 1 STRAIGHT STRL (MISCELLANEOUS) IMPLANT
COVER SURGICAL LIGHT HANDLE (MISCELLANEOUS) ×6 IMPLANT
DECANTER SPIKE VIAL GLASS SM (MISCELLANEOUS) ×3 IMPLANT
DEVICE TROCAR PUNCTURE CLOSURE (ENDOMECHANICALS) IMPLANT
DRAPE LAPAROSCOPIC ABDOMINAL (DRAPES) ×3 IMPLANT
ELECT REM PT RETURN 9FT ADLT (ELECTROSURGICAL) ×3
ELECTRODE REM PT RTRN 9FT ADLT (ELECTROSURGICAL) ×1 IMPLANT
ENSEAL DEVICE STD TIP 35CM (ENDOMECHANICALS) IMPLANT
GAUZE SPONGE 4X4 12PLY STRL (GAUZE/BANDAGES/DRESSINGS) ×3 IMPLANT
GLOVE BIOGEL PI IND STRL 7.0 (GLOVE) ×1 IMPLANT
GLOVE BIOGEL PI INDICATOR 7.0 (GLOVE) ×2
GOWN SPEC L4 XLG W/TWL (GOWN DISPOSABLE) ×3 IMPLANT
GOWN STRL REUS W/ TWL XL LVL3 (GOWN DISPOSABLE) ×3 IMPLANT
GOWN STRL REUS W/TWL LRG LVL3 (GOWN DISPOSABLE) ×3 IMPLANT
GOWN STRL REUS W/TWL XL LVL3 (GOWN DISPOSABLE) ×6
HANDLE SUCTION POOLE (INSTRUMENTS) ×1 IMPLANT
LEGGING LITHOTOMY PAIR STRL (DRAPES) IMPLANT
LIGASURE IMPACT 36 18CM CVD LR (INSTRUMENTS) ×3 IMPLANT
LIQUID BAND (GAUZE/BANDAGES/DRESSINGS) ×3 IMPLANT
NS IRRIG 1000ML POUR BTL (IV SOLUTION) ×6 IMPLANT
PACK COLON (CUSTOM PROCEDURE TRAY) ×3 IMPLANT
RTRCTR WOUND ALEXIS 18CM MED (MISCELLANEOUS) ×3
SCISSORS LAP 5X35 DISP (ENDOMECHANICALS) IMPLANT
SET IRRIG TUBING LAPAROSCOPIC (IRRIGATION / IRRIGATOR) IMPLANT
SHEARS HARMONIC ACE PLUS 36CM (ENDOMECHANICALS) ×3 IMPLANT
SOLUTION ANTI FOG 6CC (MISCELLANEOUS) ×3 IMPLANT
SPONGE LAP 18X18 X RAY DECT (DISPOSABLE) ×6 IMPLANT
STAPLER VISISTAT 35W (STAPLE) ×3 IMPLANT
STRIP CLOSURE SKIN 1/2X4 (GAUZE/BANDAGES/DRESSINGS) IMPLANT
SUCTION POOLE HANDLE (INSTRUMENTS) ×3
SUT MNCRL AB 4-0 PS2 18 (SUTURE) ×6 IMPLANT
SUT PDS AB 1 CT1 27 (SUTURE) ×6 IMPLANT
SUT PDS AB 1 CTX 36 (SUTURE) IMPLANT
SUT PDS AB 4-0 SH 27 (SUTURE) IMPLANT
SUT PROLENE 2 0 KS (SUTURE) IMPLANT
SUT SILK 2 0 (SUTURE) ×2
SUT SILK 2 0 SH CR/8 (SUTURE) ×6 IMPLANT
SUT SILK 2-0 18XBRD TIE 12 (SUTURE) ×1 IMPLANT
SUT SILK 3 0 (SUTURE) ×2
SUT SILK 3 0 SH CR/8 (SUTURE) ×3 IMPLANT
SUT SILK 3-0 18XBRD TIE 12 (SUTURE) ×1 IMPLANT
SUT VIC AB 2-0 CT1 27 (SUTURE)
SUT VIC AB 2-0 CT1 27XBRD (SUTURE) IMPLANT
SUT VIC AB 3-0 PS2 18 (SUTURE)
SUT VIC AB 3-0 PS2 18XBRD (SUTURE) IMPLANT
SUT VIC AB 3-0 SH 18 (SUTURE) ×3 IMPLANT
SUT VIC AB 4-0 SH 18 (SUTURE) IMPLANT
SUT VICRYL 0 UR6 27IN ABS (SUTURE) IMPLANT
SYR 30ML LL (SYRINGE) ×3 IMPLANT
TOWEL OR 17X26 10 PK STRL BLUE (TOWEL DISPOSABLE) ×3 IMPLANT
TRAY FOLEY W/METER SILVER 14FR (SET/KITS/TRAYS/PACK) ×3 IMPLANT
TRAY FOLEY W/METER SILVER 16FR (SET/KITS/TRAYS/PACK) ×3 IMPLANT
TROCAR BLADELESS OPT 5 100 (ENDOMECHANICALS) IMPLANT
TROCAR XCEL NON-BLD 11X100MML (ENDOMECHANICALS) IMPLANT
TROCAR XCEL UNIV SLVE 11M 100M (ENDOMECHANICALS) IMPLANT
TUBING CONNECTING 10 (TUBING) IMPLANT
TUBING CONNECTING 10' (TUBING)
TUBING FILTER THERMOFLATOR (ELECTROSURGICAL) ×3 IMPLANT
YANKAUER SUCT BULB TIP NO VENT (SUCTIONS) ×3 IMPLANT

## 2015-03-25 NOTE — Brief Op Note (Signed)
03/25/2015  2:29 PM  PATIENT:  Kelly [name], 49 y.o., female, MRN: [id number]  PREOP DIAGNOSIS:  right colon intussusception with mass  POSTOP DIAGNOSIS:   5 cm lipoma of right colon as source of right colon intussusception, normal lap band  PROCEDURE:   Procedure(s): LAPAROSCOPIC ASSISTED RIGHT HEMI COLECTOMY, with removal of lap band fluid prior to prepping (approx 4 cc).  SURGEON:   Alphonsa Overall, M.D.  ASSISTANT:   Saverio Danker, PA  ANESTHESIA:   general  Anesthesiologist: Rod Mae, MD CRNA: Lavina Hamman, CRNA  General  EBL:  minimal  ml  BLOOD ADMINISTERED: none  DRAINS: none   LOCAL MEDICATIONS USED:   35 cc 1/4% marcaine  SPECIMEN:   Right colon - I spoke to Dr. Kittie Plater in pathology about the specimen  COUNTS CORRECT:  YES  INDICATIONS FOR PROCEDURE:  Kelly [name] is a 49 y.o. (DOB: [birth year]) AA   female whose primary care physician is [name], DO and comes for right colectomy for right colon intussusception secondary to colonic mass   The indications and risks of the surgery were explained to the patient.  The risks include, but are not limited to, infection, bleeding, and nerve injury.  Note dictated to:   [phone]

## 2015-03-25 NOTE — Anesthesia Procedure Notes (Signed)
Procedure Name: Intubation Date/Time: 03/25/2015 12:04 PM Performed by: Euclide Granito, Virgel Gess Pre-anesthesia Checklist: Patient identified, Emergency Drugs available, Suction available, Patient being monitored and Timeout performed Patient Re-evaluated:Patient Re-evaluated prior to inductionOxygen Delivery Method: Circle system utilized Preoxygenation: Pre-oxygenation with 100% oxygen Intubation Type: IV induction and Rapid sequence Laryngoscope Size: Mac and 3 Grade View: Grade I Tube type: Oral Tube size: 7.5 mm Number of attempts: 1 Airway Equipment and Method: Patient positioned with wedge pillow and Stylet Placement Confirmation: ETT inserted through vocal cords under direct vision,  positive ETCO2,  CO2 detector and breath sounds checked- equal and bilateral Secured at: 22 cm Tube secured with: Tape Dental Injury: Teeth and Oropharynx as per pre-operative assessment

## 2015-03-25 NOTE — Anesthesia Postprocedure Evaluation (Signed)
Anesthesia Post Note  Patient: Kelly [name]  Procedure(s) Performed: Procedure(s) (LRB): LAPAROSCOPIC ASSISTED RIGHT HEMI COLECTOMY, with removal of lap band fluid prior to prepping. (Right)  Patient location during evaluation: PACU Anesthesia Type: General Level of consciousness: awake and alert Pain management: pain level controlled Vital Signs Assessment: post-procedure vital signs reviewed and stable Respiratory status: spontaneous breathing, nonlabored ventilation, respiratory function stable and patient connected to nasal cannula oxygen Cardiovascular status: blood pressure returned to baseline and stable Postop Assessment: no signs of nausea or vomiting Anesthetic complications: no    Last Vitals:  Filed Vitals:   03/25/15 1445 03/25/15 1500  BP: 136/71 140/70  Pulse: 100 94  Temp:    Resp: 19 16    Last Pain:  Filed Vitals:   03/25/15 1505  PainSc: 0-No pain                 Dex Blakely L

## 2015-03-25 NOTE — Anesthesia Preprocedure Evaluation (Addendum)
Anesthesia Evaluation  Patient identified by MRN, date of birth, ID band Patient awake    Reviewed: Allergy & Precautions, H&P , NPO status , Patient's Chart, lab work & pertinent test results, reviewed documented beta blocker date and time   History of Anesthesia Complications (+) PONV  Airway Mallampati: II  TM Distance: >3 FB Neck ROM: full    Dental  (+) Dental Advisory Given, Chipped Right upper front chipped:   Pulmonary neg pulmonary ROS,    Pulmonary exam normal breath sounds clear to auscultation       Cardiovascular Exercise Tolerance: Good hypertension, Pt. on home beta blockers Normal cardiovascular exam Rhythm:regular Rate:Normal  PACs and PVCs   Neuro/Psych negative neurological ROS  negative psych ROS   GI/Hepatic negative GI ROS, Neg liver ROS,   Endo/Other  Hypothyroidism Morbid obesity  Renal/GU negative Renal ROS  negative genitourinary   Musculoskeletal   Abdominal (+) + obese,   Peds  Hematology negative hematology ROS (+)   Anesthesia Other Findings   Reproductive/Obstetrics negative OB ROS                           Anesthesia Physical Anesthesia Plan  ASA: III  Anesthesia Plan: General   Post-op Pain Management:    Induction: Intravenous  Airway Management Planned: Oral ETT  Additional Equipment:   Intra-op Plan:   Post-operative Plan: Extubation in OR  Informed Consent: I have reviewed the patients History and Physical, chart, labs and discussed the procedure including the risks, benefits and alternatives for the proposed anesthesia with the patient or authorized representative who has indicated his/her understanding and acceptance.   Dental Advisory Given  Plan Discussed with: CRNA and Surgeon  Anesthesia Plan Comments:         Anesthesia Quick Evaluation

## 2015-03-25 NOTE — Interval H&P Note (Signed)
History and Physical Interval Note:  03/25/2015 11:19 AM  Kelly [name]  has presented today for surgery, with the diagnosis of right colon intussusception with mass  The various methods of treatment have been discussed with the patient and family.  Her parents are here with her.  After consideration of risks, benefits and other options for treatment, the patient has consented to  Procedure(s): LAPAROSCOPIC ASSISTED RIGHT HEMI COLECTOMY (Right) as a surgical intervention .  The patient's history has been reviewed, patient examined, no change in status, stable for surgery.  I have reviewed the patient's chart and labs.  Questions were answered to the patient's satisfaction.     [name]

## 2015-03-25 NOTE — H&P (View-Only) (Signed)
Re:   Kelly [name] DOB:   05/27/1965 MRN:   [id number]  [facility] consultation/admission  ASSESSMENT AND PLAN: 1.  Intussusception of ileum into cecum  She is not acutely ill and this is causing low grade symptoms.  But I think that she will need a right colectomy to resect this probably benign mass and prevent further intussusceptions.  She wants to go home.  I have given her a bowel prep and told her to stay on clear liquids.  I will plan her surgery for Friday, 12/30.  I reviewed with the patient the findings and need for colon surgery.  I discussed both surgical and non surgical options.  I discussed the role of laparoscopic and open surgery in colon surgery.  I reviewed the risks of surgery, including, but not limited to, infection, bleeding, nerve injury, anastomotic leaks, and possibility of colostomy.  I went over the preoperative mechanical and antibiotic bowel prep for colon surgery and provided prescriptions for these.  2.  Morbid obesity  Weight - 233, BMI - 40  She has been in discussion with Dr. Excell Seltzer about revising the lap band to another surgical procedure. 3.  Lap band (with [place] repair)  05/23/2009 - [place] 4.  HTN 5.  Depression/anxiety 6.  Hypothyroid - on replacement  Seen by Dr. Elyse Hsu  Chief Complaint  Patient presents with  . Abdominal Pain   REFERRING PHYSICIAN: [name], DO  HISTORY OF PRESENT ILLNESS: Kelly [name] is a 49 y.o. (DOB: May 26, 1965)  AA  female whose primary care physician is [name], DO and comes to the [facility] ER today for abdominal pain.  She underwent a recent colonoscopy by Dr. Rande Brunt on around 03/04/2015.  She had this because she turned 49.   She was found to have a benign polyp of the transverse colon.  She even saw Dr. Excell Seltzer in the interim for discussion of her lap band surgery. About a week after the colonoscopy, she has had some mid epigastric pain.  She has the pain most of the day, but it seems better at times and  worse at times.  She saw Dr. Ardis Hughs back on 03/16/2015 - a KUB was unremkable and her WBC was normal.  The pain persisted and was worse in 03/19/2015.  Because of the continued pain, she had an abdominal CT scan today.  The abdominal/pelvis CT scan showed - 1. Ileocolic intussusception with ileum extending through the ascending colon to the proximal transverse colon. There is a 5 cm fatty mass within the transverse colon, differential diagnostic considerations include large transverse colon lipoma versus fatty mass included with the intussusceptum. The intussusception does not appear to extend beyond this fatty mass.  2. The lap band appears well positioned and oriented.  3. Several small hypodense hepatic lesions are too small to characterize but likely benign.  4. Posterior intramural uterine fibroids. Small left ovarian cyst.  She has no GI history.  No history of stomach, liver, pancreas, or colon disease.  Her only prior surgery was the lap band in 2011.    Past Medical History  Diagnosis Date  . Hypertension   . Allergic rhinitis   . Arthritis     feet, rt knee  . Depression with anxiety   . Heart murmur   . Thyroid goiter   . 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   . Morbid obesity ([place]) 2009  . 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 Laparoscopic gastric banding  05-23-09    dr Abran Cantor with hiatal hernia repair.   . Liposuction  1995      No current facility-administered medications for this encounter.   Current Outpatient Prescriptions  Medication Sig Dispense Refill  . ALPRAZolam (XANAX) 0.25 MG tablet Take 1 tablet (0.25 mg total) by mouth 3 (three) times daily as needed for anxiety. 60 tablet 2  . Cholecalciferol (VITAMIN D) 2000 UNITS CAPS Take by mouth daily.    . ciprofloxacin (CIPRO) 500 MG tablet Take 1 tablet (500 mg total) by mouth 2 (two)  times daily. 10 tablet 0  . hydrochlorothiazide (HYDRODIURIL) 25 MG tablet Take 1 tablet (25 mg total) by mouth daily. 30 tablet 11  . hyoscyamine (LEVSIN SL) 0.125 MG SL tablet Place 1 tablet (0.125 mg total) under the tongue every 6 (six) hours as needed for cramping. 40 tablet 0  . levothyroxine (SYNTHROID, LEVOTHROID) 50 MCG tablet Take 50 mcg by mouth daily before breakfast.    . metoprolol succinate (TOPROL-XL) 25 MG 24 hr tablet Take 1 tablet (25 mg total) by mouth daily. 30 tablet 2  . metroNIDAZOLE (FLAGYL) 500 MG tablet Take 1 tablet (500 mg total) by mouth 3 (three) times daily. 15 tablet 0  . traMADol (ULTRAM) 50 MG tablet Take 1 tablet (50 mg total) by mouth every 6 (six) hours as needed for moderate pain. (Patient not taking: Reported on 03/16/2015) 30 tablet 2  . valACYclovir (VALTREX) 1000 MG tablet Take 500 mg by mouth daily.     . Vitamin D, Ergocalciferol, (DRISDOL) 50000 UNITS CAPS capsule Take 1 capsule (50,000 Units total) by mouth every 7 (seven) days. 4 capsule 3     No Known Allergies  REVIEW OF SYSTEMS: Skin:  No history of rash.  No history of abnormal moles. Infection:  No history of hepatitis or HIV.  No history of MRSA. Neurologic:  No history of stroke.  No history of seizure.  No history of headaches. Cardiac:  HTN Pulmonary:  Does not smoke cigarettes.  No asthma or bronchitis.  No OSA/CPAP.  Endocrine:  No diabetes. Followed by Dr. Elyse Hsu for hypothyroidism. Gastrointestinal:  See HPI. Urologic:  No history of kidney stones.  No history of bladder infections. Musculoskeletal:  No history of joint or back disease. Hematologic:  No bleeding disorder.  No history of anemia.  Not anticoagulated. Psycho-social:  The patient is oriented.   The patient has no obvious psychologic or social impairment to understanding our conversation and plan.  SOCIAL and FAMILY HISTORY:  Unmarried.  No children. 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 Her parents live in  [place], [place]: BP 143/60 mmHg  Pulse 100  Temp(Src) 98.6 F (37 C) (Oral)  Resp 20  SpO2 99%  LMP 03/05/2015 (Approximate)  General: WN AA obese F who is alert and generally healthy appearing.  HEENT: Normal. Pupils equal. Neck: Supple. No mass.  No thyroid mass. Lymph Nodes:  No supraclavicular or cervical nodes. Lungs: Clear to auscultation and symmetric breath sounds. Heart:  RRR. No murmur or rub. Abdomen: Soft. No mass. No hernia. Normal bowel sounds.   Port in [place].  Sore in her epigastrium, but no peritoneal signs. Rectal: Not done. Extremities:  Good strength and ROM  in upper and lower extremities. Neurologic:  Grossly intact to motor and sensory function. Psychiatric: Has normal mood and affect. Behavior is  normal.   DATA REVIEWED: Epic notes.  Alphonsa Overall, MD,  [facility], [address]  [place], [place]    [place] Phone:  [phone] FAX:  [phone]

## 2015-03-25 NOTE — Op Note (Signed)
[name], Kelly [name] NO.:  [id number]  MEDICAL RECORD NO.:  [id number]  LOCATION:  [facility]                         FACILITY:  [facility]  PHYSICIAN:  Fenton Malling. Lucia Gaskins, M.D.  DATE OF BIRTH:  1966-01-17  DATE OF PROCEDURE:  03/25/2015                              OPERATIVE REPORT   PREOPERATIVE DIAGNOSIS:  Intussusception of right colon secondary to approximate 5 cm lipoma.  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  PROCEDURE:  Laparoscopic-assisted right hemicolectomy and removal of all fluid from her lap band.  SURGEON:  Fenton Malling. Lucia Gaskins, M.D.  FIRST ASSISTANT:  Saverio Danker, Physician Assistant.  ANESTHESIA:  General endotracheal.  ESTIMATED BLOOD LOSS:  Minimal or less than 100 mL.  DRAINS LEFT IN:  None.  INDICATION FOR PROCEDURE:  Kelly Kelly [name] is a 49 year old African American female who is a patient Dr. Garnet Kelly Kelly [name].  She underwent an uneventful colonoscopy by Dr. Ardis Hughs on March 04, 2015.  He found a single polyp, which was removed.  However, about 1 week postop, she developed abdominal pain of unclear etiology.  A CT scan obtained on December 28, revealed an apparent intussusception of her right colon with an approximate 5 cm mass leading the intussusception looks like a lipoma by CT scan.    I discussed with the patient the options for treatment, but I think she is best served with having this area resected, which required right hemicolectomy.  So, she went home, did a bowel prep at home with mechanical antibiotic and now presents today for an elective right hemicolectomy.  DESCRIPTION OF PROCEDURE:  She was taken to room #2 at [facility], underwent general anesthesia.  She took antibiotic preoperatively.  She was given 2 g of cefotetan preoperatively.    A time-out was held and the surgical checklist run.  I started with an Optiview in the left upper quadrant; however, on putting the  Optiview in, I speared through the mesentery of the small bowel.  I came a close to 1 loop of small bowel.  I placed 3 additional trocars, 1 in the right above the umbilicus, 1 in the left lower quadrant, 1 in the right lower quadrant.  The first thing I did was I marked near the small bowel where I went through the mesentery with a suture.  I would inspect this loop of bowel at the end of the case after my colon anastomosis. It was okay.  I then proceed with my operation.  She had a palpable mass, we could see in the mid right colon, was about 5 cm in length correlates well with what we seen on the CT scan.  I mobilized her terminal ileum, her cecum, her right colon all the way to the duodenum and to the mid transverse colon.  After I had mobilized all this, there was no evidence of any malignancy or masses, but she did have a lap band.  Before surgery, I removed all the fluid from lap band just to help in management of her p.o. intake postoperatively.  Once I thought I had the colon mobilized enough, I then made incision which  was periumbilical, I went this low because of her lap band, I did not want to cut across where her lap band tubing was.  I made approximately 5 cm open incision in the fascia, I was able to bring up the right colon and mass with it.  I divided the terminal ileum with a 75 GIA stapler. I divided the hepatic flexure with a 75 GIA stapler.  I took down the mesentery with the Harmonic scapel and Kelly clamps.  I then sent the specimen to Pathology.  The pathologist, Dr. Casimer Lanius, confirmed this looked like a benign lipoma with a central ulceration mucosa, but no other mucosal of concerns.  I then did a side-to-side ileal to transverse colon anastomosis.  I used a 16 GIA stapler to staple the 2 ends of bowel together then closed the enterotomy with interrupted 3-0 silk sutures.  I closed the mesentery with a running 2-0 Vicryl suture.  I then was able to reduce this back into the  abdominal cavity.    I then pulled up the loop of small bowel I was worried about and I reinspected this.   There was no evidence of any bowel injury.  I then irrigated the abdomen with 2 L of saline, I closed the fascia with 2 running #1 PDS sutures.  I irrigated the wound with saline.  I then re-laparoscope the patient and irrigated with about a liter until the fluid was clear.  The midline wound looked good with no evidence of any bowel or omentum caught in this.  I then removed the laparoscopic trocars in turn.  I closed the midline wound with 3-0 Vicryl, subcutaneous 4-0 Monocryl suture and the skin in each trocar site with a 4-0 Monocryl suture.  The patient tolerated procedure well, was transported to the recovery room in good condition.  Sponge and needle count were correct at the end of the case.  Final pathology is pending.   Fenton Malling. Lucia Gaskins, M.D., [facility], scribe for Epic   DHN/MEDQ  D:  03/25/2015  T:  03/25/2015  Job:  [id number]  cc:   Juanda Bond. [place], M.D. Fax: [place], DO [place], [place] [address]  Milus Banister, MD

## 2015-03-25 NOTE — Transfer of Care (Signed)
Immediate Anesthesia Transfer of Care Note  Patient: Kelly [name]  Procedure(s) Performed: Procedure(s): LAPAROSCOPIC ASSISTED RIGHT HEMI COLECTOMY, with removal of lap band fluid prior to prepping. (Right)  Patient Location: PACU  Anesthesia Type:General  Level of Consciousness: alert , patient cooperative and lethargic  Airway & Oxygen Therapy: Patient Spontanous Breathing and Patient connected to face mask oxygen  Post-op Assessment: Report given to RN, Post -op Vital signs reviewed and stable and Patient moving all extremities X 4  Post vital signs: stable  Last Vitals:  Filed Vitals:   03/25/15 0627  BP: 125/73  Pulse: 114  Temp: 36.6 C  Resp: 18    Complications: No apparent anesthesia complications

## 2015-03-25 NOTE — Addendum Note (Signed)
Addendum  created 03/25/15 1515 by Rod Mae, MD   Modules edited: BPA Follow-up Actions, Orders

## 2015-03-26 LAB — BASIC METABOLIC PANEL
Anion gap: 8 (ref 5–15)
BUN: 6 mg/dL (ref 6–20)
CHLORIDE: 106 mmol/L (ref 101–111)
CO2: 23 mmol/L (ref 22–32)
CREATININE: 0.84 mg/dL (ref 0.44–1.00)
Calcium: 8.2 mg/dL — ABNORMAL LOW (ref 8.9–10.3)
GFR calc Af Amer: >60 mL/min (ref >60)
GFR calc non Af Amer: >60 mL/min (ref >60)
Glucose, Bld: 156 mg/dL — ABNORMAL HIGH (ref 65–99)
Potassium: 4.3 mmol/L (ref 3.5–5.1)
SODIUM: 137 mmol/L (ref 135–145)

## 2015-03-26 LAB — CBC
HCT: 37.1 % (ref 36.0–46.0)
Hemoglobin: 12.2 g/dL (ref 12.0–15.0)
MCH: 30.3 pg (ref 26.0–34.0)
MCHC: 32.9 g/dL (ref 30.0–36.0)
MCV: 92.3 fL (ref 78.0–100.0)
PLATELETS: 391 10*3/uL (ref 150–400)
RBC: 4.02 MIL/uL (ref 3.87–5.11)
RDW: 12 % (ref 11.5–15.5)
WBC: 14.8 10*3/uL — ABNORMAL HIGH (ref 4.0–10.5)

## 2015-03-26 LAB — HEMOGLOBIN A1C
HEMOGLOBIN A1C: 5.2 % (ref 4.8–5.6)
MEAN PLASMA GLUCOSE: 103 mg/dL

## 2015-03-26 NOTE — Progress Notes (Signed)
1 Day Post-Op lap R hemicolectomy and removal of band fluid Subjective: Denies nausea, pain controlled.  Objective: Vital signs in last 24 hours: Temp:  [97.8 F (36.6 C)-98.6 F (37 C)] 98.3 F (36.8 C) (12/31 0500) Pulse Rate:  [55-101] 82 (12/31 0500) Resp:  [12-19] 18 (12/31 0500) BP: (129-157)/(60-82) 132/60 mmHg (12/31 0500) SpO2:  [98 %-100 %] 100 % (12/31 0500)   Intake/Output from previous day: 12/30 0701 - 12/31 0700 In: 3789.6 [I.V.:3789.6] Out: 715 [Urine:715] Intake/Output this shift:     General appearance: alert and cooperative GI: soft, appropriately tender  Incision: no significant drainage  Lab Results:   Recent Labs  03/25/15 0715 03/26/15 0510  WBC 9.6 14.8*  HGB 13.5 12.2  HCT 40.4 37.1  PLT 443* 391   BMET  Recent Labs  03/25/15 0715 03/26/15 0510  NA 140 137  K 3.8 4.3  CL 105 106  CO2 26 23  GLUCOSE 105* 156*  BUN 8 6  CREATININE 1.02* 0.84  CALCIUM 9.3 8.2*   PT/INR No results for input(s): LABPROT, INR in the last 72 hours. ABG No results for input(s): PHART, HCO3 in the last 72 hours.  Invalid input(s): PCO2, PO2  MEDS, Scheduled . alvimopan  12 mg Oral BID  . heparin subcutaneous  5,000 Units Subcutaneous 3 times per day  . ketorolac  30 mg Intravenous 3 times per day    Studies/Results: No results found.  Assessment: s/p Procedure(s): LAPAROSCOPIC ASSISTED RIGHT HEMI COLECTOMY, with removal of lap band fluid prior to prepping. Patient Active Problem List   Diagnosis Date Noted  . Intussusception of colon ([place]) 03/25/2015  . Gastrointestinal multiple polyposis syndrome 03/22/2015  . Preventative health care 01/08/2015  . Palpitations 05/18/2014  . Knee pain 10/09/2013  . Popliteal pain 10/09/2013  . Obesity (BMI 30-39.9) 08/15/2013  . Cellulitis and abscess of buttock 02/15/2013  . LAP-BAND surgery status 07/01/2012  . Onychomycosis 08/15/2011  . Lapband APS Feb 2011 06/01/2011  . Depression 01/15/2011   . MORBID OBESITY 09/13/2009  . CONTACT DERMATITIS 09/13/2009  . NECK AND BACK PAIN 04/14/2009  . SKIN TAG 12/21/2008  . ARTHRITIS 08/03/2008  . Pain in joint, lower leg 09/17/2007  . HYPERTENSION 06/17/2007  . EDEMA 06/17/2007  . OBESITY 08/08/2006  . Anxiety state 08/08/2006  . ALLERGIC RHINITIS 08/08/2006    Expected post op course  Plan: d/c foley Advance diet to clears Ambulate   LOS: 1 day     .Rosario Adie, MD [facility], [place]   03/26/2015 10:22 AM

## 2015-03-27 MED ORDER — SIMETHICONE 80 MG PO CHEW
80.0000 mg | CHEWABLE_TABLET | Freq: Four times a day (QID) | ORAL | Status: DC | PRN
  Administered 2015-03-27: 80 mg via ORAL
  Filled 2015-03-27 (×2): qty 1

## 2015-03-27 NOTE — Progress Notes (Signed)
2 Days Post-Op  Subjective: Having some epigastric discomfort. No n/v/burping. +flatus. Small loose BM this am. Doing IS. walking  Objective: Vital signs in last 24 hours: Temp:  [98.1 F (36.7 C)-98.4 F (36.9 C)] 98.1 F (36.7 C) (01/01 0608) Pulse Rate:  [64-87] 64 (01/01 0608) Resp:  [16] 16 (01/01 0608) BP: (121-144)/(54-68) 121/62 mmHg (01/01 0608) SpO2:  [97 %-100 %] 97 % (01/01 0608) Last BM Date: 03/25/15  Intake/Output from previous day: 12/31 0701 - 01/01 0700 In: 777 [P.O.:777] Out: 1325 [Urine:1325] Intake/Output this shift:    Alert, nontoxic cta b/l Reg Soft, mild exp TTP, incisions c/d/i; not really distended No edema  Lab Results:   Recent Labs  03/25/15 0715 03/26/15 0510  WBC 9.6 14.8*  HGB 13.5 12.2  HCT 40.4 37.1  PLT 443* 391   BMET  Recent Labs  03/25/15 0715 03/26/15 0510  NA 140 137  K 3.8 4.3  CL 105 106  CO2 26 23  GLUCOSE 105* 156*  BUN 8 6  CREATININE 1.02* 0.84  CALCIUM 9.3 8.2*   PT/INR No results for input(s): LABPROT, INR in the last 72 hours. ABG No results for input(s): PHART, HCO3 in the last 72 hours.  Invalid input(s): PCO2, PO2  Studies/Results: No results found.  Anti-infectives: Anti-infectives    Start     Dose/Rate Route Frequency Ordered Stop   03/25/15 0628  cefoTEtan (CEFOTAN) 2 g in dextrose 5 % 50 mL IVPB     2 g 100 mL/hr over 30 Minutes Intravenous On call to O.R. 03/25/15 0628 03/25/15 1139      Assessment/Plan: s/p Procedure(s): LAPAROSCOPIC ASSISTED RIGHT HEMI COLECTOMY, with removal of lap band fluid prior to prepping. (Right)  Doing well pulm toilet lovenox Stay on clears this am. If does well this am and lunch, adv to full liquid for dinner Ambulate  Leighton Ruff. Redmond Pulling, MD, FACS General, Bariatric, & Minimally Invasive Surgery [facility], [place]   LOS: 2 days    Gayland Curry 03/27/2015

## 2015-03-28 MED ORDER — OXYCODONE-ACETAMINOPHEN 5-325 MG PO TABS
1.0000 | ORAL_TABLET | ORAL | Status: DC | PRN
  Administered 2015-03-28 – 2015-03-29 (×3): 2 via ORAL
  Administered 2015-03-29: 1 via ORAL
  Administered 2015-03-29 – 2015-03-30 (×3): 2 via ORAL
  Filled 2015-03-28: qty 2
  Filled 2015-03-28: qty 1
  Filled 2015-03-28 (×5): qty 2

## 2015-03-28 NOTE — Progress Notes (Signed)
Utilization review completed.  

## 2015-03-28 NOTE — Progress Notes (Signed)
3 Days Post-Op lap R hemicolectomy and removal of band fluid Subjective: Denies nausea, pain not quite controlled with PO meds.  Tolerating a FLD and passing flatus.  Hasn't ambulated outside her room yet  Objective: Vital signs in last 24 hours: Temp:  [97.9 F (36.6 C)-98.7 F (37.1 C)] 98.2 F (36.8 C) (01/02 0600) Pulse Rate:  [73-82] 82 (01/02 0600) Resp:  [16-18] 16 (01/02 0600) BP: (114-131)/(47-63) 118/63 mmHg (01/02 0600) SpO2:  [99 %] 99 % (01/02 0600)   Intake/Output from previous day: 01/01 0701 - 01/02 0700 In: 2701.7 [I.V.:2701.7] Out: 1450 [Urine:1450] Intake/Output this shift:     General appearance: alert and cooperative GI: soft, appropriately tender  Incision: no significant drainage  Lab Results:   Recent Labs  03/26/15 0510  WBC 14.8*  HGB 12.2  HCT 37.1  PLT 391   BMET  Recent Labs  03/26/15 0510  NA 137  K 4.3  CL 106  CO2 23  GLUCOSE 156*  BUN 6  CREATININE 0.84  CALCIUM 8.2*   PT/INR No results for input(s): LABPROT, INR in the last 72 hours. ABG No results for input(s): PHART, HCO3 in the last 72 hours.  Invalid input(s): PCO2, PO2  MEDS, Scheduled . alvimopan  12 mg Oral BID  . heparin subcutaneous  5,000 Units Subcutaneous 3 times per day    Studies/Results: No results found.  Assessment: s/p Procedure(s): LAPAROSCOPIC ASSISTED RIGHT HEMI COLECTOMY, with removal of lap band fluid prior to prepping. Patient Active Problem List   Diagnosis Date Noted  . Intussusception of colon ([place]) 03/25/2015  . Gastrointestinal multiple polyposis syndrome 03/22/2015  . Preventative health care 01/08/2015  . Palpitations 05/18/2014  . Knee pain 10/09/2013  . Popliteal pain 10/09/2013  . Obesity (BMI 30-39.9) 08/15/2013  . Cellulitis and abscess of buttock 02/15/2013  . LAP-BAND surgery status 07/01/2012  . Onychomycosis 08/15/2011  . Lapband APS Feb 2011 06/01/2011  . Depression 01/15/2011  . MORBID OBESITY 09/13/2009  .  CONTACT DERMATITIS 09/13/2009  . NECK AND BACK PAIN 04/14/2009  . SKIN TAG 12/21/2008  . ARTHRITIS 08/03/2008  . Pain in joint, lower leg 09/17/2007  . HYPERTENSION 06/17/2007  . EDEMA 06/17/2007  . OBESITY 08/08/2006  . Anxiety state 08/08/2006  . ALLERGIC RHINITIS 08/08/2006    Expected post op course  Plan: Advance to soft diet Switch Vicodin to percocet Ambulate   LOS: 3 days     .Rosario Adie, [facility], [place]   03/28/2015 9:45 AM

## 2015-03-29 NOTE — Progress Notes (Signed)
Pt noted with 4 port surgical sites, all C/D/I sealed with liquid adhensive. Pt IVF's infusing without difficulty. Pt with c/o mid  abd pain rated 6/10 surgical site with being tender and slight discomfort. Prn pain meds given per Doctor order. Pt ambulating to the Bathroom and voiding without difficulty. Phone and call light placed within reach. Monitoring continue per Doctor order and unit protocol.

## 2015-03-29 NOTE — Progress Notes (Signed)
4 Days Post-Op  Subjective: She looks pretty good, tolerating diet, hasn't walked outside of the room.  NO BM so far/    Objective: Vital signs in last 24 hours: Temp:  [97.9 F (36.6 C)-98.7 F (37.1 C)] 97.9 F (36.6 C) (01/03 0429) Pulse Rate:  [68-84] 84 (01/03 0429) Resp:  [16] 16 (01/03 0429) BP: (120-144)/(62-68) 144/62 mmHg (01/03 0429) SpO2:  [99 %-100 %] 100 % (01/03 0429) Last BM Date: 03/25/15 Nothing PO recorded  Afebrile, VSS Labs 12/31 showed WBC was up No films Intake/Output from previous day: 01/02 0701 - 01/03 0700 In: 900 [I.V.:900] Out: -  Intake/Output this shift:    General appearance: alert, cooperative and no distress Resp: clear to auscultation bilaterally GI: soft, sore, sites all look great.  + BS, NO BM.  Lab Results:  No results for input(s): WBC, HGB, HCT, PLT in the last 72 hours.  BMET No results for input(s): NA, K, CL, CO2, GLUCOSE, BUN, CREATININE, CALCIUM in the last 72 hours. PT/INR No results for input(s): LABPROT, INR in the last 72 hours.   Recent Labs Lab 03/25/15 0715  AST 16  ALT 11*  ALKPHOS 93  BILITOT 1.3*  PROT 8.5*  ALBUMIN 3.9     Lipase     Component Value Date/Time   LIPASE 28 01/18/2014 1013     Studies/Results: No results found.  Medications: . alvimopan  12 mg Oral BID  . heparin subcutaneous  5,000 Units Subcutaneous 3 times per day    Assessment/Plan Intussusception of right colon secondary to approximate 5 cm lipoma. S/p Laparoscopic-assisted right hemicolectomy and removal of all fluid from her lap band, 03/25/15, Dr. Lucia Gaskins Morbid obesity with lap band with [facility] repair 04/2009 - Dr. Excell Seltzer Hypertension Depression Hypothyroid on supplement Antibiotics:  None DVT:  Heparin/SCD  PLan:  Continue soft diet, ambulate in the halls and hopefully home tomorrow.       LOS: 4 days    JENNINGS,WILLARD 03/29/2015  I have seen and examined this patient and agree with the assessment and  plan.   Matt B. Hassell Done, MD, [facility] [phone] beeper [phone]  03/29/2015 12:44 PM

## 2015-03-30 MED ORDER — IBUPROFEN 200 MG PO TABS: ORAL_TABLET | ORAL | Status: DC

## 2015-03-30 MED ORDER — OXYCODONE-ACETAMINOPHEN 5-325 MG PO TABS: 1.0000 | ORAL_TABLET | ORAL | Status: DC | PRN

## 2015-03-30 NOTE — Progress Notes (Signed)
5 Days Post-Op  Subjective: She is feeling much better and looks good.  Tolerating soft diet and had 2 BM's.  Wounds look good.  Objective: Vital signs in last 24 hours: Temp:  [98.1 F (36.7 C)-98.4 F (36.9 C)] 98.4 F (36.9 C) (01/04 0600) Pulse Rate:  [72-80] 74 (01/04 0600) Resp:  [16] 16 (01/04 0600) BP: (137-146)/(62-66) 146/66 mmHg (01/04 0600) SpO2:  [98 %-100 %] 98 % (01/04 0600) Last BM Date: 03/31/15 BM x 2 160 Po recorded Afebrile, VSS No labs Intake/Output from previous day: 01/03 0701 - 01/04 0700 In: 1675 [P.O.:160; I.V.:1515] Out: -  Intake/Output this shift: Total I/O In: 105 [P.O.:60; I.V.:45] Out: -   General appearance: alert, cooperative and no distress Resp: clear to auscultation bilaterally GI:  Abdomen is soft, + BS, +BM, sites all look good.  Lab Results:  No results for input(s): WBC, HGB, HCT, PLT in the last 72 hours.  BMET No results for input(s): NA, K, CL, CO2, GLUCOSE, BUN, CREATININE, CALCIUM in the last 72 hours. PT/INR No results for input(s): LABPROT, INR in the last 72 hours.   Recent Labs Lab 03/25/15 0715  AST 16  ALT 11*  ALKPHOS 93  BILITOT 1.3*  PROT 8.5*  ALBUMIN 3.9     Lipase     Component Value Date/Time   LIPASE 28 01/18/2014 1013     Studies/Results: No results found.  Medications: . alvimopan  12 mg Oral BID  . heparin subcutaneous  5,000 Units Subcutaneous 3 times per day   Path report:  Diagnosis Colon, segmental resection for tumor, right - LIPOMA, 5.5 CM, WITH ULCERATION AND FIBROSIS. - NO EVIDENCE OF MALIGNANCY. - UNREMARKABLE APPENDIX. - FIFTEEN BENIGN LYMPH NODES (0/15). - BENIGN GLANDULAR INCLUSIONS. - SEE   Assessment/Plan Intussusception of right colon secondary to approximate 5 cm lipoma. S/p Laparoscopic-assisted right hemicolectomy and removal of all fluid from her lap band, 03/25/15, Dr. Lucia Gaskins Morbid obesity with lap band with [facility] repair 04/2009 - Dr.  Excell Seltzer Hypertension Depression Hypothyroid on supplement Antibiotics: None DVT: Heparin/SCD   Plan:  Home today.    LOS: 5 days    Kelly [name] 03/30/2015

## 2015-03-30 NOTE — Discharge Instructions (Signed)
[facility]      [facility] [phone]  ABDOMINAL SURGERY: POST OP INSTRUCTIONS  Always review your discharge instruction sheet given to you by the facility where your surgery was performed.  IF YOU HAVE DISABILITY OR FAMILY LEAVE FORMS, YOU MUST BRING THEM TO THE OFFICE FOR PROCESSING.  PLEASE DO NOT GIVE THEM TO YOUR DOCTOR.  1. A prescription for pain medication may be given to you upon discharge.  Take your pain medication as prescribed, if needed.  If narcotic pain medicine is not needed, then you may take acetaminophen (Tylenol) or ibuprofen (Advil) as needed. 2. Take your usually prescribed medications unless otherwise directed. 3. If you need a refill on your pain medication, please contact your pharmacy. They will contact our office to request authorization.  Prescriptions will not be filled after 5pm or on week-ends. 4. You should follow a light diet the first few days after arrival home, such as soup and crackers, pudding, etc.unless your doctor has advised otherwise. A high-fiber, low fat diet can be resumed as tolerated.   Be sure to include lots of fluids daily. Most patients will experience some swelling and bruising on the chest and neck area.  Ice packs will help.  Swelling and bruising can take several days to resolve 5. Most patients will experience some swelling and bruising in the area of the incision. Ice pack will help. Swelling and bruising can take several days to resolve..  6. It is common to experience some constipation if taking pain medication after surgery.  Increasing fluid intake and taking a stool softener will usually help or prevent this problem from occurring.  A mild laxative (Milk of Magnesia or Miralax) should be taken according to package directions if there are no bowel movements after 48 hours. 7.  You may have steri-strips (small skin tapes) in place directly over the incision.  These strips should be left on the skin for 7-10 days.  If your  surgeon used skin glue on the incision, you may shower in 24 hours.  The glue will flake off over the next 2-3 weeks.  Any sutures or staples will be removed at the office during your follow-up visit. You may find that a light gauze bandage over your incision may keep your staples from being rubbed or pulled. You may shower and replace the bandage daily. 8. ACTIVITIES:  You may resume regular (light) daily activities beginning the next day--such as daily self-care, walking, climbing stairs--gradually increasing activities as tolerated.  You may have sexual intercourse when it is comfortable.  Refrain from any heavy lifting or straining until approved by your doctor. a. You may drive when you no longer are taking prescription pain medication, you can comfortably wear a seatbelt, and you can safely maneuver your car and apply brakes b. Return to Work: ___________________________________ 9. You should see your doctor in the office for a follow-up appointment approximately two weeks after your surgery.  Make sure that you call for this appointment within a day or two after you arrive home to insure a convenient appointment time. OTHER INSTRUCTIONS:  _____________________________________________________________ _____________________________________________________________  WHEN TO CALL YOUR DOCTOR: 1. Fever over 101.0 2. Inability to urinate 3. Nausea and/or vomiting 4. Extreme swelling or bruising 5. Continued bleeding from incision. 6. Increased pain, redness, or drainage from the incision. 7. Difficulty swallowing or breathing 8. Muscle cramping or spasms. 9. Numbness or tingling in hands or feet or around lips.  The clinic staff is available to answer   your questions during regular business hours.  Please don't hesitate to call and ask to speak to one of the nurses if you have concerns.  For further questions, please visit [email or web address]   

## 2015-03-30 NOTE — Care Management Note (Signed)
Case Management Note  Patient Details  Name: Kelly [name] MRN: [id number] Date of Birth: Nov 08, 1965  Subjective/Objective:     S/p S/p Laparoscopic-assisted right hemicolectomy and removal of all fluid from her lap band               Action/Plan: Discharge planning, no HH needs identified  Expected Discharge Date:  03/27/15               Expected Discharge Plan:  Home/Self Care  In-House Referral:  NA  Discharge planning Services  CM Consult  Post Acute Care Choice:  NA Choice offered to:  NA  DME Arranged:  N/A DME Agency:  NA  HH Arranged:  NA HH Agency:  NA  Status of Service:  Completed, signed off  Medicare Important Message Given:    Date Medicare IM Given:    Medicare IM give by:    Date Additional Medicare IM Given:    Additional Medicare Important Message give by:     If discussed at [place] of Stay Meetings, dates discussed:    Additional Comments:  Guadalupe Maple, RN 03/30/2015, 10:06 AM

## 2015-03-30 NOTE — Progress Notes (Signed)
Pharmacy Brief Note - Alvimopan (Entereg)   The standing order set for alvimopan (Entereg) now includes an automatic order to discontinue the drug after the patient has had a bowel movement. The change was approved by the [place] and the Medical Executive Committee.   This patient has had bowel movements documented by nursing. Therefore, alvimopan has been discontinued. If there are questions, please contact the pharmacy at [phone].   Thank you-  Doreene Eland, PharmD, BCPS.   Pager: [phone] 03/30/2015 11:16 AM

## 2015-03-30 NOTE — Progress Notes (Signed)
Patient ambulating in hall without diff.,  tolerating mechanical soft diet well and oral pain meds, port sites without any redness, swelling or drainage.  Patient has had 2 BM.  Discharge instructions reviewed with patient.  Patient verbalizes understanding.  Patient to be discharged home to self care.

## 2015-03-30 NOTE — Plan of Care (Signed)
Problem: Nutrition: Goal: Adequate nutrition will be maintained Outcome: Progressing Pt currently tolerating a soft diet.

## 2015-04-01 NOTE — Discharge Summary (Signed)
Physician Discharge Summary  Patient ID: Kelly [name] MRN: [id number] DOB/AGE: 11-20-1965 50 y.o.  Admit date: 03/25/2015 Discharge date: 03/30/2015  Admission Diagnoses:  1. Intussusception of ileum into cecum 2. Morbid obesity 4. HTN 5. Depression/anxiety 6. Hypothyroid - on replacement Seen by Dr. Elyse Hsu  Discharge Diagnoses:  Same  Active Problems:   Intussusception of colon [facility])   PROCEDURES: Laparoscopic-assisted right hemicolectomy and removal of all fluid from her lap band, 03/25/15,   Fenton Malling. Lucia Gaskins, M.D.  Hospital Course:  Kelly [name] is a 50 y.o. (DOB: 1965/09/23) AA female whose primary care physician is [name], DO and comes to the [facility] ER today for abdominal pain. She underwent a recent colonoscopy by Dr. Rande Brunt on around 03/04/2015. She had this because she turned 49. She was found to have a benign polyp of the transverse colon. She even saw Dr. Excell Seltzer in the interim for discussion of her lap band surgery. About a week after the colonoscopy, she has had some mid epigastric pain. She has the pain most of the day, but it seems better at times and worse at times. She saw Dr. Ardis Hughs back on 03/16/2015 - a KUB was unremkable and her WBC was normal. The pain persisted and was worse in 03/19/2015. Because of the continued pain, she had an abdominal CT scan today. The abdominal/pelvis CT scan showed - 1. Ileocolic intussusception with ileum extending through the ascending colon to the proximal transverse colon. There is a 5 cm fatty mass within the transverse colon, differential diagnostic considerations include large transverse colon lipoma versus fatty mass included with the intussusceptum. The intussusception does not appear to extend beyond this fatty mass. 2. The lap band appears well positioned and oriented. 3. Several small hypodense hepatic lesions are too small tocharacterize but likely  benign. 4. Posterior intramural uterine fibroids. Small left ovarian cyst.  She has no GI history. No history of stomach, liver, pancreas, or colon disease. Her only prior surgery was the lap band in 2011. Pt was seen and admitted by Dr. Lucia Gaskins and taken to the [place] on 03/25/15.  Post op she had some ileus but made a quick recovery.  She was sent home tolerating a soft diet with adequate BM's on 03/30/15.  CBC Latest Ref Rng 03/26/2015 03/25/2015 03/15/2015  WBC 4.0 - 10.5 K/uL 14.8(H) 9.6 10.9(H)  Hemoglobin 12.0 - 15.0 g/dL 12.2 13.5 12.9  Hematocrit 36.0 - 46.0 % 37.1 40.4 37.9  Platelets 150 - 400 K/uL 391 443(H) 333.0   CMP Latest Ref Rng 03/26/2015 03/25/2015 01/10/2015  Glucose 65 - 99 mg/dL 156(H) 105(H) -  BUN 6 - 20 mg/dL 6 8 -  Creatinine 0.44 - 1.00 mg/dL 0.84 1.02(H) -  Sodium 135 - 145 mmol/L 137 140 -  Potassium 3.5 - 5.1 mmol/L 4.3 3.8 -  Chloride 101 - 111 mmol/L 106 105 -  CO2 22 - 32 mmol/L 23 26 -  Calcium 8.9 - 10.3 mg/dL 8.2(L) 9.3 -  Total Protein 6.5 - 8.1 g/dL - 8.5(H) 7.0  Total Bilirubin 0.3 - 1.2 mg/dL - 1.3(H) 0.7  Alkaline Phos 38 - 126 U/L - 93 64  AST 15 - 41 U/L - 16 16  ALT 14 - 54 U/L - 11(L) 10    Condition on d/c:  Improved     Disposition: 01-Home or Self Care     Medication List    STOP taking these medications        ciprofloxacin 500 MG  tablet  Commonly known as:  CIPRO     erythromycin base 500 MG tablet  Commonly known as:  E-MYCIN     hyoscyamine 0.125 MG SL tablet  Commonly known as:  LEVSIN SL     metoprolol succinate 25 MG 24 hr tablet  Commonly known as:  TOPROL-XL     metroNIDAZOLE 500 MG tablet  Commonly known as:  FLAGYL     neomycin 500 MG tablet  Commonly known as:  MYCIFRADIN     traMADol 50 MG tablet  Commonly known as:  ULTRAM      TAKE these medications        ALPRAZolam 0.25 MG tablet  Commonly known as:  XANAX  Take 1 tablet (0.25 mg total) by mouth 3 (three) times daily as needed  for anxiety.     hydrochlorothiazide 25 MG tablet  Commonly known as:  HYDRODIURIL  Take 1 tablet (25 mg total) by mouth daily.     ibuprofen 200 MG tablet  Commonly known as:  ADVIL,MOTRIN  You can take 2-3 tablets every 6 hours as needed for pain.     levothyroxine 50 MCG tablet  Commonly known as:  SYNTHROID, LEVOTHROID  Take 50 mcg by mouth daily before breakfast.     oxyCODONE-acetaminophen 5-325 MG tablet  Commonly known as:  PERCOCET/ROXICET  Take 1-2 tablets by mouth every 4 (four) hours as needed for moderate pain or severe pain.     valACYclovir 1000 MG tablet  Commonly known as:  VALTREX  Take 500 mg by mouth daily.     Vitamin D (Ergocalciferol) 50000 units Caps capsule  Commonly known as:  DRISDOL  Take 1 capsule (50,000 Units total) by mouth every 7 (seven) days.     Vitamin D 2000 units Caps  Take by mouth daily.           Follow-up Information    Follow up with [facility], MD. Schedule an appointment as soon as possible for a visit in 1 week.   Specialty:  General Surgery   Contact information:   [address] [place] [address] [phone]       Follow up with [name], DO.   Specialty:  Family Medicine   Why:  call and let her follow up with you for your blood pressure and medcical issues.   Contact information:   [address] [place] [address] [phone]       Signed: Earnstine Regal 04/01/2015, 4:59 PM

## 2015-05-31 ENCOUNTER — Other Ambulatory Visit: Payer: Self-pay | Admitting: Obstetrics and Gynecology

## 2015-05-31 ENCOUNTER — Other Ambulatory Visit: Payer: Self-pay

## 2015-05-31 DIAGNOSIS — N6489 Other specified disorders of breast: Secondary | ICD-10-CM

## 2015-06-16 ENCOUNTER — Telehealth: Payer: Self-pay | Admitting: Family Medicine

## 2015-06-16 NOTE — Telephone Encounter (Signed)
Left message for patient to reschedule on 2/23,3/1,3/7,3/13,and 3/21 for patient to call back and reschedule appointment scheduled for 06/28/2015 with Dr. Etter Sjogren. Appointment cancelled 3/23/217

## 2015-06-24 ENCOUNTER — Ambulatory Visit
Admission: RE | Admit: 2015-06-24 | Discharge: 2015-06-24 | Disposition: A | Discharge status: Home / Self Care | Payer: BLUE CROSS/BLUE SHIELD | Source: Ambulatory Visit

## 2015-06-24 DIAGNOSIS — N6489 Other specified disorders of breast: Secondary | ICD-10-CM

## 2015-07-08 ENCOUNTER — Ambulatory Visit: Payer: BLUE CROSS/BLUE SHIELD | Admitting: Family Medicine

## 2015-07-15 ENCOUNTER — Ambulatory Visit (INDEPENDENT_AMBULATORY_CARE_PROVIDER_SITE_OTHER): Payer: BLUE CROSS/BLUE SHIELD | Admitting: Family Medicine

## 2015-07-15 ENCOUNTER — Encounter: Payer: Self-pay | Admitting: Family Medicine

## 2015-07-15 VITALS — BP 118/82 | HR 85 | Temp 98.7°F | Ht 64.0 in | Wt 236.4 lb

## 2015-07-15 DIAGNOSIS — F419 Anxiety disorder, unspecified: Secondary | ICD-10-CM | POA: Diagnosis not present

## 2015-07-15 DIAGNOSIS — R002 Palpitations: Secondary | ICD-10-CM

## 2015-07-15 DIAGNOSIS — I10 Essential (primary) hypertension: Secondary | ICD-10-CM

## 2015-07-15 DIAGNOSIS — R000 Tachycardia, unspecified: Secondary | ICD-10-CM

## 2015-07-15 DIAGNOSIS — M1990 Unspecified osteoarthritis, unspecified site: Secondary | ICD-10-CM | POA: Diagnosis not present

## 2015-07-15 MED ORDER — NALTREXONE-BUPROPION HCL ER 8-90 MG PO TB12: ORAL_TABLET | ORAL | Status: DC

## 2015-07-15 MED ORDER — ALPRAZOLAM 0.25 MG PO TABS: 0.2500 mg | ORAL_TABLET | Freq: Three times a day (TID) | ORAL | Status: DC | PRN

## 2015-07-15 MED ORDER — TRAMADOL HCL 50 MG PO TABS: 50.0000 mg | ORAL_TABLET | Freq: Every day | ORAL | Status: DC

## 2015-07-15 NOTE — Progress Notes (Signed)
Patient ID: Kelly [name], female    DOB: [birth year]  Age: 50 y.o. MRN: [id number]    Subjective:  Subjective HPI AMARYS VENTERS presents for f/u bp and c/o her troubles losing weight.  She is asking for contrave.    Review of Systems  Constitutional: Negative for diaphoresis, appetite change, fatigue and unexpected weight change.  Eyes: Negative for pain, redness and visual disturbance.  Respiratory: Negative for cough, chest tightness, shortness of breath and wheezing.   Cardiovascular: Negative for chest pain, palpitations and leg swelling.  Endocrine: Negative for cold intolerance, heat intolerance, polydipsia, polyphagia and polyuria.  Genitourinary: Negative for dysuria, frequency and difficulty urinating.  Neurological: Negative for dizziness, light-headedness, numbness and headaches.    History Past Medical History  Diagnosis Date  . Hypertension   . Allergic rhinitis   . Arthritis     feet, rt knee  . Depression with anxiety   . Heart murmur   . Thyroid goiter   . 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   . Morbid obesity ([place]) 2009  . Intussusception of colon ([place]) [id number]    ileocolic  . PONV (postoperative nausea and vomiting)   . PVC (premature ventricular contraction)   . PAC (premature atrial contraction)   . Pneumonia     hx of as a child   . Hypothyroidism   . Anxiety   . History of hiatal hernia     repaur 2011     She has past surgical history that includes Laparoscopic gastric banding (05-23-09); Liposuction (1995); and Laparoscopic right hemi colectomy (Right, 03/25/2015).   Her family history includes Alzheimer's disease in her paternal grandfather; Aneurysm (age of onset: 59) in her mother; Diabetes in her paternal aunt; Heart disease in her maternal grandfather; Heart disease (age of onset: 29) in her paternal grandmother; Hypertension in her father, maternal grandmother, mother, and paternal grandmother.  There is no history of Colon cancer, Esophageal cancer, Rectal cancer, or Stomach cancer.She reports that she has never smoked. She has never used smokeless tobacco. She reports that she drinks about 1.8 oz of alcohol per week. She reports that she does not use illicit drugs.  Current Outpatient Prescriptions on File Prior to Visit  Medication Sig Dispense Refill  . Cholecalciferol (VITAMIN D) 2000 UNITS CAPS Take by mouth daily.    . hydrochlorothiazide (HYDRODIURIL) 25 MG tablet Take 1 tablet (25 mg total) by mouth daily. (Patient taking differently: Take 25 mg by mouth daily. As needed for edema) 30 tablet 11  . ibuprofen (ADVIL,MOTRIN) 200 MG tablet You can take 2-3 tablets every 6 hours as needed for pain. 30 tablet 0  . levothyroxine (SYNTHROID, LEVOTHROID) 50 MCG tablet Take 50 mcg by mouth daily before breakfast.    . valACYclovir (VALTREX) 1000 MG tablet Take 500 mg by mouth daily.      No current facility-administered medications on file prior to visit.     Objective:  Objective Physical Exam  Constitutional: She is oriented to person, place, and time. She appears well-developed and well-nourished.  HENT:  Head: Normocephalic and atraumatic.  Eyes: Conjunctivae and EOM are normal.  Neck: Normal range of motion. Neck supple. No JVD present. Carotid bruit is not present. No thyromegaly present.  Cardiovascular: Normal rate, regular rhythm and normal heart sounds.   No murmur heard. Pulmonary/Chest: Effort normal and breath sounds normal. No respiratory distress. She has no wheezes. She has no rales. She exhibits no tenderness.  Musculoskeletal: She exhibits no edema.  Neurological: She is alert and oriented to person, place, and time.  Psychiatric: She has a normal mood and affect.   BP 118/82 mmHg  Pulse 85  Temp(Src) 98.7 F (37.1 C) (Oral)  Ht 5\' 4"  (1.626 m)  Wt 236 lb 6.4 oz (107.23 kg)  BMI 40.56 kg/m2  SpO2 98% Wt Readings from Last 3 Encounters:  07/15/15 236 lb  6.4 oz (107.23 kg)  03/25/15 224 lb 2 oz (101.662 kg)  03/16/15 235 lb 2 oz (106.652 kg)     Lab Results  Component Value Date   WBC 14.8* 03/26/2015   HGB 12.2 03/26/2015   HCT 37.1 03/26/2015   PLT 391 03/26/2015   GLUCOSE 156* 03/26/2015   CHOL 164 01/10/2015   TRIG 92.0 01/10/2015   HDL 62.00 01/10/2015   LDLCALC 83 01/10/2015   ALT 11* 03/25/2015   AST 16 03/25/2015   NA 137 03/26/2015   K 4.3 03/26/2015   CL 106 03/26/2015   CREATININE 0.84 03/26/2015   BUN 6 03/26/2015   CO2 23 03/26/2015   TSH 2.92 01/10/2015   HGBA1C 5.2 03/25/2015   MICROALBUR <0.7 01/10/2015    US Breast Ltd Uni Left Inc Axilla  06/24/2015  CLINICAL DATA:  Short-term followup for a probably benign lesion in the left breast at 9 o'clock. EXAM: 2D DIGITAL DIAGNOSTIC LEFT MAMMOGRAM WITH CAD AND ADJUNCT TOMO ULTRASOUND LEFT BREAST COMPARISON:  Previous exam(s). ACR Breast Density Category b: There are scattered areas of fibroglandular density. FINDINGS: The mass seen in the anterior upper outer left breast appears smaller. This was shown to be a simple cyst. The opacity seen more posteriorly in the medial left breast is less apparent. There are no new abnormalities. There are no suspicious calcifications. Mammographic images were processed with CAD. Targeted ultrasound is performed, showing a small hypoechoic lesion in the left breast at 9 o'clock, 2 cm from the nipple currently measuring 4.1 x 1.7 x 3.2 mm, decreased in size the prior study. IMPRESSION: No evidence of malignancy. Benign left breast lesion likely a small cluster of cysts or focal fibrocystic change. RECOMMENDATION: Screening mammogram in September 2017 per normal screening schedule.(Code:SM-B-01Y) I have discussed the findings and recommendations with the patient. Results were also provided in writing at the conclusion of the visit. If applicable, a reminder letter will be sent to the patient regarding the next appointment. BI-RADS CATEGORY  2:  Benign. Electronically Signed   By: Lajean Manes M.D.   On: 06/24/2015 14:52   Mm Diag Breast Tomo Uni Left  06/24/2015  CLINICAL DATA:  Short-term followup for a probably benign lesion in the left breast at 9 o'clock. EXAM: 2D DIGITAL DIAGNOSTIC LEFT MAMMOGRAM WITH CAD AND ADJUNCT TOMO ULTRASOUND LEFT BREAST COMPARISON:  Previous exam(s). ACR Breast Density Category b: There are scattered areas of fibroglandular density. FINDINGS: The mass seen in the anterior upper outer left breast appears smaller. This was shown to be a simple cyst. The opacity seen more posteriorly in the medial left breast is less apparent. There are no new abnormalities. There are no suspicious calcifications. Mammographic images were processed with CAD. Targeted ultrasound is performed, showing a small hypoechoic lesion in the left breast at 9 o'clock, 2 cm from the nipple currently measuring 4.1 x 1.7 x 3.2 mm, decreased in size the prior study. IMPRESSION: No evidence of malignancy. Benign left breast lesion likely a small cluster of cysts or focal fibrocystic change. RECOMMENDATION: Screening mammogram in  September 2017 per normal screening schedule.(Code:SM-B-01Y) I have discussed the findings and recommendations with the patient. Results were also provided in writing at the conclusion of the visit. If applicable, a reminder letter will be sent to the patient regarding the next appointment. BI-RADS CATEGORY  2: Benign. Electronically Signed   By: Lajean Manes M.D.   On: 06/24/2015 14:52     Assessment & Plan:  Plan I have discontinued Ms. Boughner's Vitamin D (Ergocalciferol) and oxyCODONE-acetaminophen. I have also changed her traMADol. Additionally, I am having her start on Naltrexone-Bupropion HCl ER. Lastly, I am having her maintain her valACYclovir, levothyroxine, hydrochlorothiazide, Vitamin D, ibuprofen, and ALPRAZolam.  Meds ordered this encounter  Medications  . DISCONTD: traMADol (ULTRAM) 50 MG tablet    Sig: Take  1-2 tablets by mouth daily.    Refill:  2  . ALPRAZolam (XANAX) 0.25 MG tablet    Sig: Take 1 tablet (0.25 mg total) by mouth 3 (three) times daily as needed for anxiety.    Dispense:  60 tablet    Refill:  2  . traMADol (ULTRAM) 50 MG tablet    Sig: Take 1-2 tablets (50-100 mg total) by mouth daily.    Dispense:  30 tablet    Refill:  2  . Naltrexone-Bupropion HCl ER (CONTRAVE) 8-90 MG TB12    Sig: 2 po bid    Dispense:  120 tablet    Refill:  3    Problem List Items Addressed This Visit      Unprioritized   MORBID OBESITY    contrave D/w pt diet and exercise rto 3 months or sooner prn      Relevant Medications   Naltrexone-Bupropion HCl ER (CONTRAVE) 8-90 MG TB12   Essential hypertension    Stable con't hctz       Palpitations - Primary   Relevant Medications   ALPRAZolam (XANAX) 0.25 MG tablet    Other Visit Diagnoses    Tachycardia        Relevant Medications    ALPRAZolam (XANAX) 0.25 MG tablet    Anxiety disorder, unspecified anxiety disorder type        Relevant Medications    ALPRAZolam (XANAX) 0.25 MG tablet    Arthritis        Relevant Medications    traMADol (ULTRAM) 50 MG tablet       Follow-up: Return in about 3 months (around 10/14/2015), or if symptoms worsen or fail to improve, for weight check.  Ann Held, DO

## 2015-07-15 NOTE — Progress Notes (Signed)
Pre visit review using our clinic review tool, if applicable. No additional management support is needed unless otherwise documented below in the visit note. 

## 2015-07-15 NOTE — Patient Instructions (Signed)

## 2015-07-17 NOTE — Assessment & Plan Note (Signed)
contrave D/w pt diet and exercise rto 3 months or sooner prn

## 2015-07-17 NOTE — Assessment & Plan Note (Signed)
Stable  con't hctz  

## 2015-07-20 ENCOUNTER — Telehealth: Payer: Self-pay | Admitting: *Deleted

## 2015-07-20 NOTE — Telephone Encounter (Signed)
PA initiated through [facility] for 120 per 30 days, awaiting determination. JG//CMA

## 2015-07-22 NOTE — Telephone Encounter (Signed)
PA approved. JG//CMA 

## 2015-10-14 ENCOUNTER — Encounter: Payer: Self-pay | Admitting: Family Medicine

## 2015-10-14 ENCOUNTER — Ambulatory Visit (INDEPENDENT_AMBULATORY_CARE_PROVIDER_SITE_OTHER): Payer: BLUE CROSS/BLUE SHIELD | Admitting: Family Medicine

## 2015-10-14 MED ORDER — LIRAGLUTIDE -WEIGHT MANAGEMENT 18 MG/3ML ~~LOC~~ SOPN: 3.0000 mg | PEN_INJECTOR | Freq: Every day | SUBCUTANEOUS | Status: DC

## 2015-10-14 NOTE — Progress Notes (Signed)
Patient ID: Kelly [name], female    DOB: 1965-04-17  Age: 50 y.o. MRN: [id number]    Subjective:  Subjective HPI Kelly [name] presents for weight check.  She stopped the contrave because she felt it was not doing anything.  She was on it for 1 1/2 months.     Review of Systems  Constitutional: Negative for diaphoresis, appetite change, fatigue and unexpected weight change.  Eyes: Negative for pain, redness and visual disturbance.  Respiratory: Negative for cough, chest tightness, shortness of breath and wheezing.   Cardiovascular: Negative for chest pain, palpitations and leg swelling.  Endocrine: Negative for cold intolerance, heat intolerance, polydipsia, polyphagia and polyuria.  Genitourinary: Negative for dysuria, frequency and difficulty urinating.  Neurological: Negative for dizziness, light-headedness, numbness and headaches.    History Past Medical History  Diagnosis Date  . Hypertension   . Allergic rhinitis   . Arthritis     feet, rt knee  . Depression with anxiety   . Heart murmur   . Thyroid goiter   . 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   . Morbid obesity ([place]) 2009  . 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 PONV (postoperative nausea and vomiting)   . PVC (premature ventricular contraction)   . PAC (premature atrial contraction)   . Pneumonia     hx of as a child   . Hypothyroidism   . Anxiety   . History of hiatal hernia     repaur 2011     She has past surgical history that includes Laparoscopic gastric banding (05-23-09); Liposuction (1995); and Laparoscopic right hemi colectomy (Right, 03/25/2015).   Her family history includes Alzheimer's disease in her paternal grandfather; Aneurysm (age of onset: 58) in her mother; Diabetes in her paternal aunt; Heart disease in her maternal grandfather; Heart disease (age of onset: 68) in her paternal grandmother; Hypertension in her father, maternal  grandmother, mother, and paternal grandmother. There is no history of Colon cancer, Esophageal cancer, Rectal cancer, or Stomach cancer.She reports that she has never smoked. She has never used smokeless tobacco. She reports that she drinks about 1.8 oz of alcohol per week. She reports that she does not use illicit drugs.  Current Outpatient Prescriptions on File Prior to Visit  Medication Sig Dispense Refill  . ALPRAZolam (XANAX) 0.25 MG tablet Take 1 tablet (0.25 mg total) by mouth 3 (three) times daily as needed for anxiety. 60 tablet 2  . Cholecalciferol (VITAMIN D) 2000 UNITS CAPS Take by mouth daily.    . hydrochlorothiazide (HYDRODIURIL) 25 MG tablet Take 1 tablet (25 mg total) by mouth daily. (Patient taking differently: Take 25 mg by mouth daily. As needed for edema) 30 tablet 11  . ibuprofen (ADVIL,MOTRIN) 200 MG tablet You can take 2-3 tablets every 6 hours as needed for pain. 30 tablet 0  . levothyroxine (SYNTHROID, LEVOTHROID) 50 MCG tablet Take 50 mcg by mouth daily before breakfast.    . traMADol (ULTRAM) 50 MG tablet Take 1-2 tablets (50-100 mg total) by mouth daily. 30 tablet 2  . valACYclovir (VALTREX) 1000 MG tablet Take 500 mg by mouth daily.      No current facility-administered medications on file prior to visit.     Objective:  Objective Physical Exam  Constitutional: She is oriented to person, place, and time. She appears well-developed and well-nourished.  HENT:  Head: Normocephalic and atraumatic.  Eyes: Conjunctivae and EOM are normal.  Neck: Normal range of motion. Neck supple. No JVD present. Carotid bruit is not present. No thyromegaly present.  Cardiovascular: Normal rate, regular rhythm and normal heart sounds.   No murmur heard. Pulmonary/Chest: Effort normal and breath sounds normal. No respiratory distress. She has no wheezes. She has no rales. She exhibits no tenderness.  Musculoskeletal: She exhibits no edema.  Neurological: She is alert and oriented to  person, place, and time.  Psychiatric: She has a normal mood and affect. Her behavior is normal. Judgment and thought content normal.  Nursing note and vitals reviewed.  BP 117/74 mmHg  Pulse 86  Temp(Src) 98.6 F (37 C) (Oral)  Ht 5\' 4"  (1.626 m)  Wt 242 lb 6.4 oz (109.952 kg)  BMI 41.59 kg/m2  SpO2 98%  LMP 03/21/2015 Wt Readings from Last 3 Encounters:  10/14/15 242 lb 6.4 oz (109.952 kg)  07/15/15 236 lb 6.4 oz (107.23 kg)  03/25/15 224 lb 2 oz (101.662 kg)     Lab Results  Component Value Date   WBC 14.8* 03/26/2015   HGB 12.2 03/26/2015   HCT 37.1 03/26/2015   PLT 391 03/26/2015   GLUCOSE 156* 03/26/2015   CHOL 164 01/10/2015   TRIG 92.0 01/10/2015   HDL 62.00 01/10/2015   LDLCALC 83 01/10/2015   ALT 11* 03/25/2015   AST 16 03/25/2015   NA 137 03/26/2015   K 4.3 03/26/2015   CL 106 03/26/2015   CREATININE 0.84 03/26/2015   BUN 6 03/26/2015   CO2 23 03/26/2015   TSH 2.92 01/10/2015   HGBA1C 5.2 03/25/2015   MICROALBUR <0.7 01/10/2015    US Breast Ltd Uni Left Inc Axilla  06/24/2015  CLINICAL DATA:  Short-term followup for a probably benign lesion in the left breast at 9 o'clock. EXAM: 2D DIGITAL DIAGNOSTIC LEFT MAMMOGRAM WITH CAD AND ADJUNCT TOMO ULTRASOUND LEFT BREAST COMPARISON:  Previous exam(s). ACR Breast Density Category b: There are scattered areas of fibroglandular density. FINDINGS: The mass seen in the anterior upper outer left breast appears smaller. This was shown to be a simple cyst. The opacity seen more posteriorly in the medial left breast is less apparent. There are no new abnormalities. There are no suspicious calcifications. Mammographic images were processed with CAD. Targeted ultrasound is performed, showing a small hypoechoic lesion in the left breast at 9 o'clock, 2 cm from the nipple currently measuring 4.1 x 1.7 x 3.2 mm, decreased in size the prior study. IMPRESSION: No evidence of malignancy. Benign left breast lesion likely a small cluster  of cysts or focal fibrocystic change. RECOMMENDATION: Screening mammogram in September 2017 per normal screening schedule.(Code:SM-B-01Y) I have discussed the findings and recommendations with the patient. Results were also provided in writing at the conclusion of the visit. If applicable, a reminder letter will be sent to the patient regarding the next appointment. BI-RADS CATEGORY  2: Benign. Electronically Signed   By: Lajean Manes M.D.   On: 06/24/2015 14:52   Mm Diag Breast Tomo Uni Left  06/24/2015  CLINICAL DATA:  Short-term followup for a probably benign lesion in the left breast at 9 o'clock. EXAM: 2D DIGITAL DIAGNOSTIC LEFT MAMMOGRAM WITH CAD AND ADJUNCT TOMO ULTRASOUND LEFT BREAST COMPARISON:  Previous exam(s). ACR Breast Density Category b: There are scattered areas of fibroglandular density. FINDINGS: The mass seen in the anterior upper outer left breast appears smaller. This was shown to be a simple cyst. The opacity seen more posteriorly in the medial left breast is less apparent. There  are no new abnormalities. There are no suspicious calcifications. Mammographic images were processed with CAD. Targeted ultrasound is performed, showing a small hypoechoic lesion in the left breast at 9 o'clock, 2 cm from the nipple currently measuring 4.1 x 1.7 x 3.2 mm, decreased in size the prior study. IMPRESSION: No evidence of malignancy. Benign left breast lesion likely a small cluster of cysts or focal fibrocystic change. RECOMMENDATION: Screening mammogram in September 2017 per normal screening schedule.(Code:SM-B-01Y) I have discussed the findings and recommendations with the patient. Results were also provided in writing at the conclusion of the visit. If applicable, a reminder letter will be sent to the patient regarding the next appointment. BI-RADS CATEGORY  2: Benign. Electronically Signed   By: Lajean Manes M.D.   On: 06/24/2015 14:52     Assessment & Plan:  Plan I have discontinued Ms.  Skibinski's Naltrexone-Bupropion HCl ER. I am also having her start on Liraglutide -Weight Management. Additionally, I am having her maintain her valACYclovir, levothyroxine, hydrochlorothiazide, Vitamin D, ibuprofen, ALPRAZolam, traMADol, Vitamin D (Ergocalciferol), and naproxen.  Meds ordered this encounter  Medications  . Vitamin D, Ergocalciferol, (DRISDOL) 50000 units CAPS capsule    Sig: Take 1 capsule by mouth once a week.    Refill:  2  . naproxen (NAPROSYN) 500 MG tablet    Sig: Take 1 tablet by mouth as needed.    Refill:  0  . Liraglutide -Weight Management (SAXENDA) 18 MG/3ML SOPN    Sig: Inject 3 mg into the skin daily.    Dispense:  3 mL    Refill:  3    Problem List Items Addressed This Visit    None    Visit Diagnoses    Morbid obesity due to excess calories ([place])    -  Primary    Relevant Medications    Liraglutide -Weight Management (SAXENDA) 18 MG/3ML SOPN     d/w diet and exercise 0.6 mg sq daily x 1 week and inc weekly by 0.6 until max 3.0 mg  Follow-up: Return in about 4 weeks (around 11/11/2015), or if symptoms worsen or fail to improve, for weight check.  Ann Held, DO

## 2015-10-14 NOTE — Progress Notes (Signed)
Pre visit review using our clinic review tool, if applicable. No additional management support is needed unless otherwise documented below in the visit note. 

## 2015-10-14 NOTE — Patient Instructions (Signed)

## 2015-10-23 IMAGING — CR DG KNEE 1-2V*L*
2 series · 2 of 2 positions shown · non-contrast
Comparison: None.

CLINICAL DATA: Posterior knee pain for 1 month.  No known injury.

EXAM:
LEFT KNEE - 1-2 VIEW

[t knee ap left]
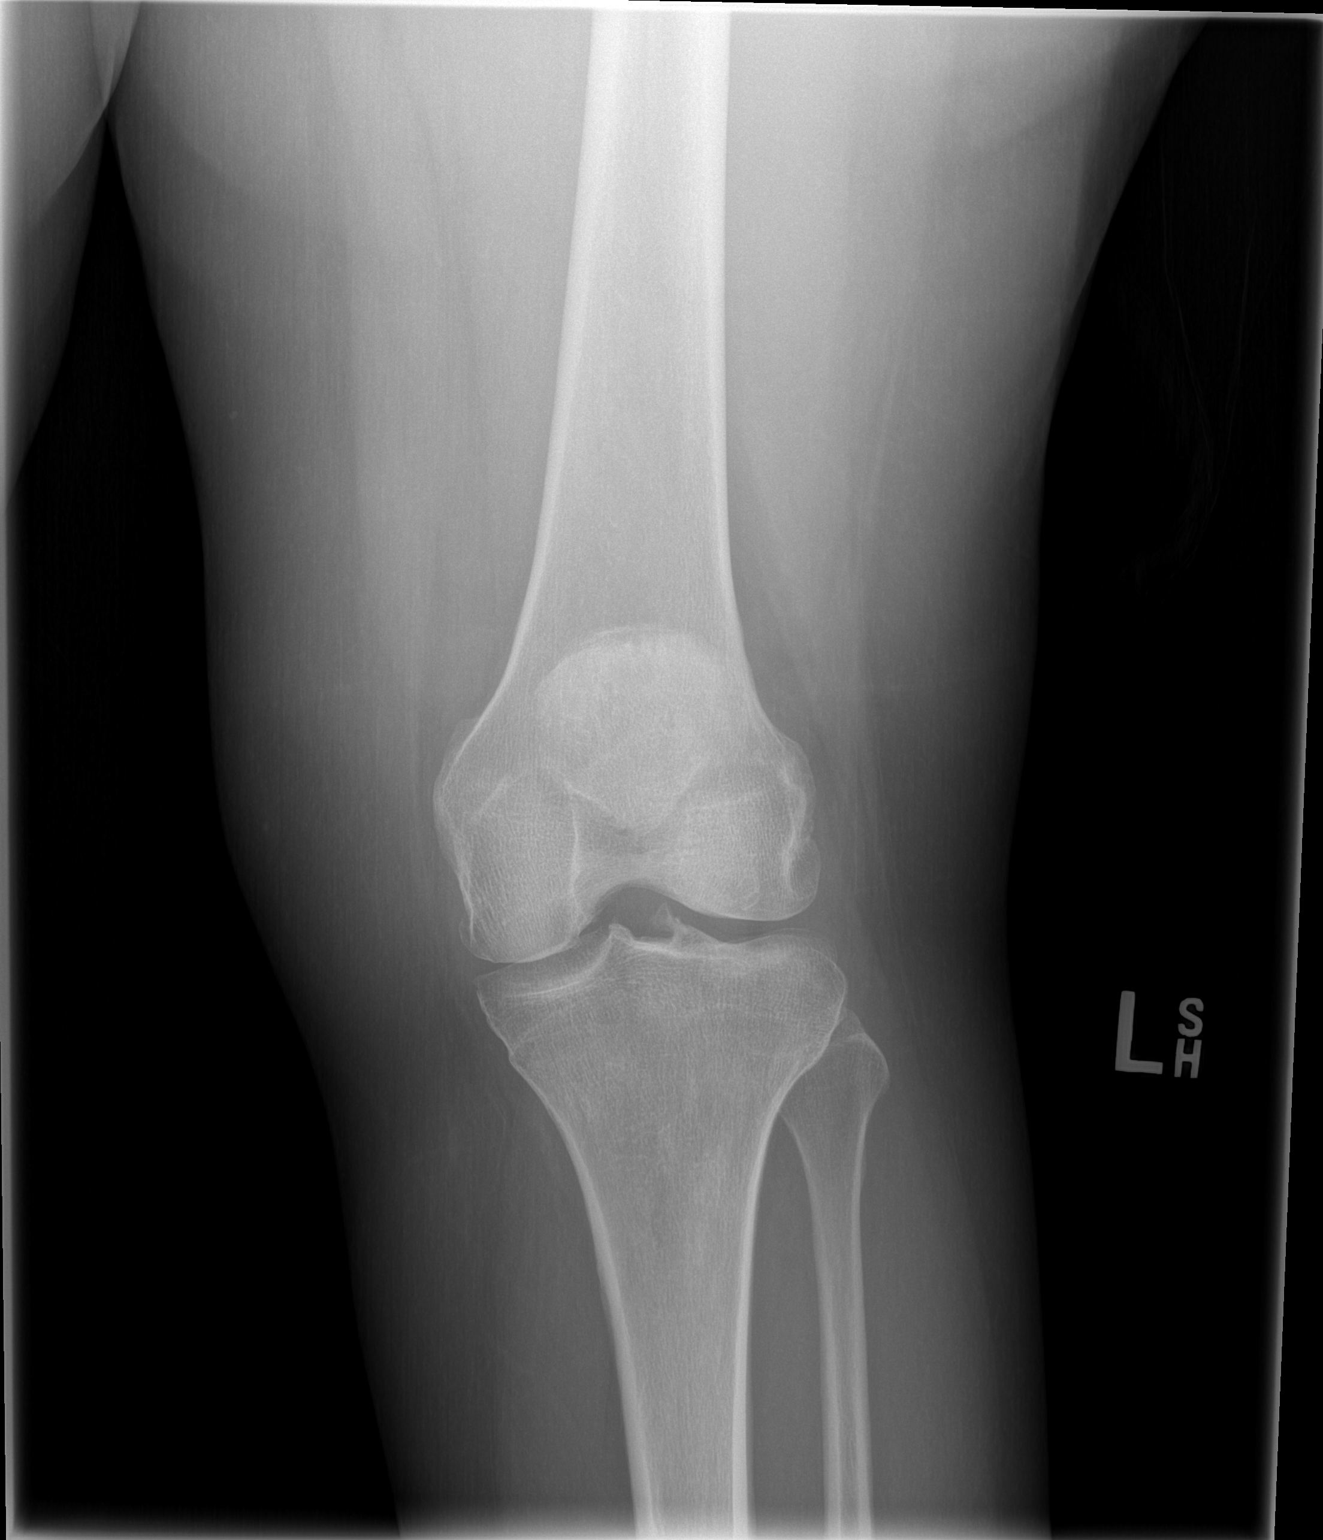

[t knee oblique left]
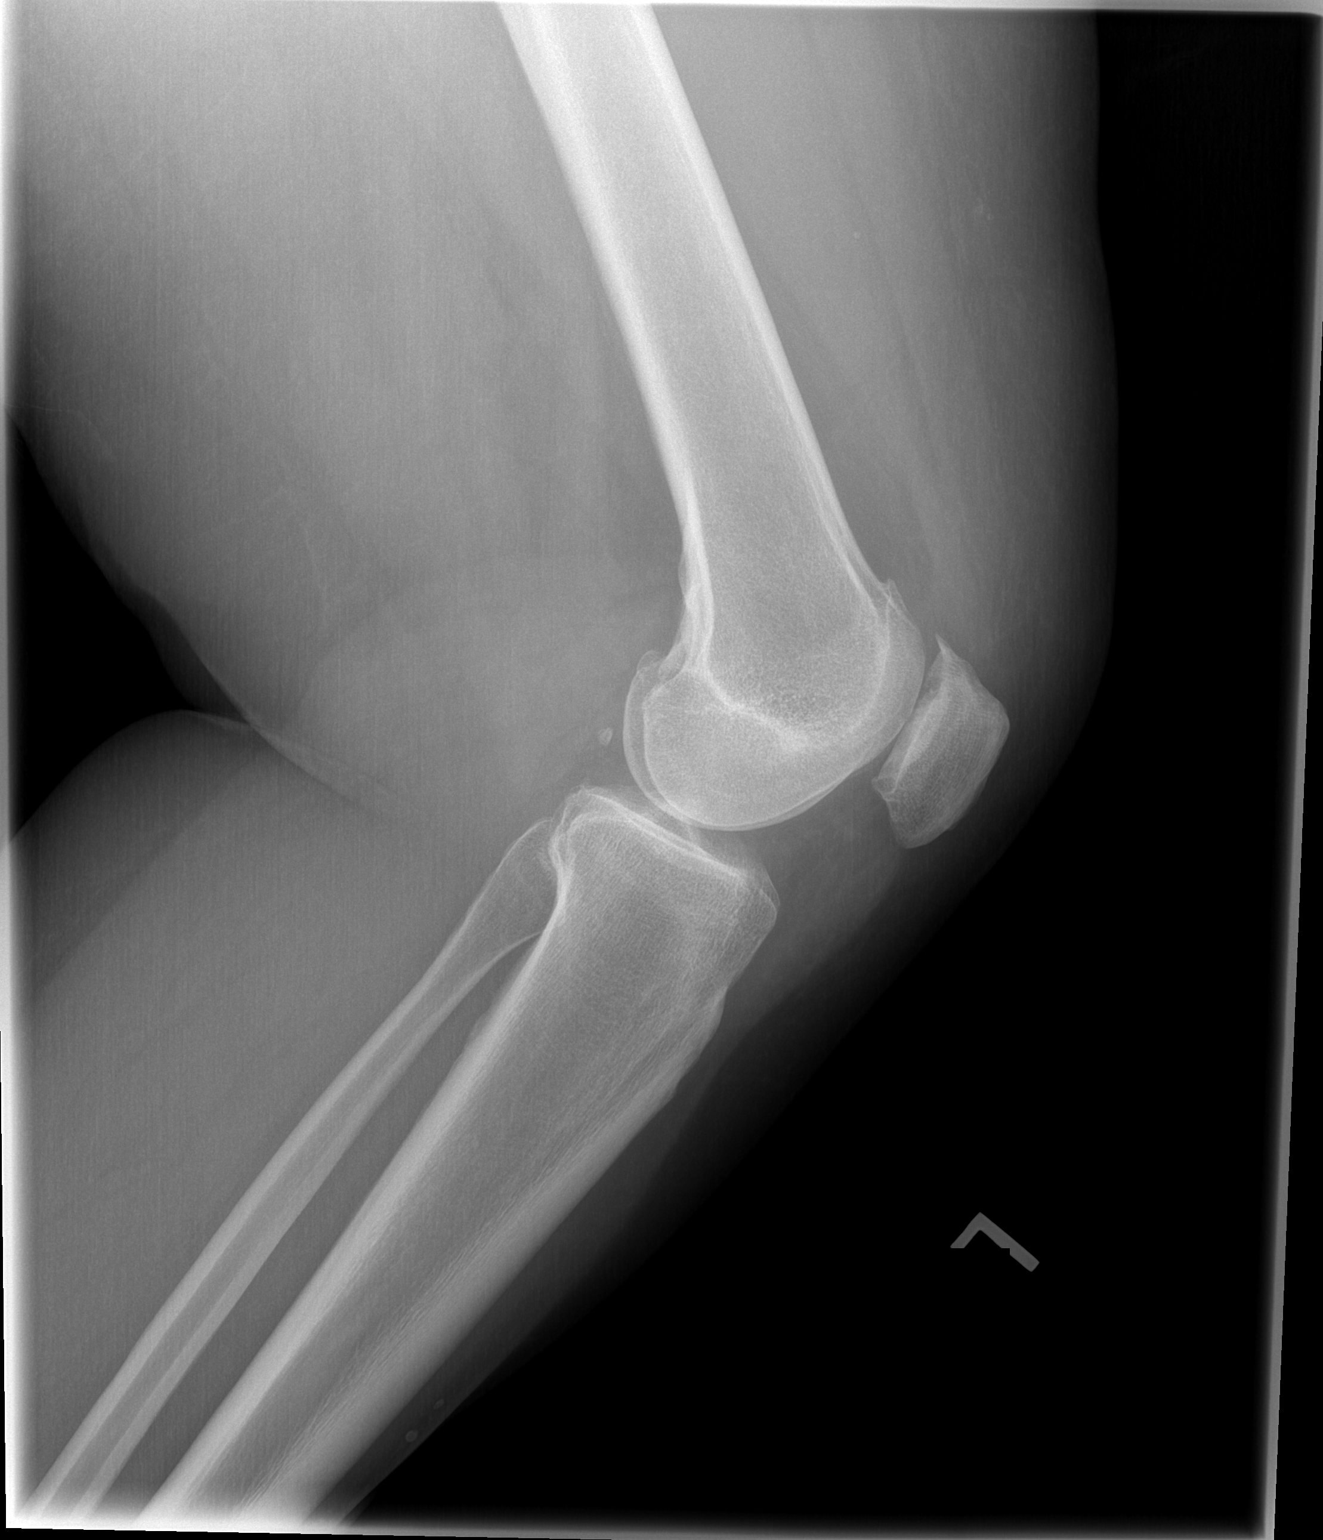

[2 of 2 positions shown; findings below may reference images not displayed]

FINDINGS: There is degenerative change involving the patellofemoral joint and
the tibial spines. No joint effusion or acute fracture.
IMPRESSION: 1. Mild degenerative changes.
2.  No evidence for acute  abnormality.

## 2015-10-26 ENCOUNTER — Telehealth: Payer: Self-pay | Admitting: *Deleted

## 2015-10-26 NOTE — Telephone Encounter (Signed)
Received PA request via fax from [employer]. PA initiated on [email or web address]. [id number] Awaiting response.//AB/CMA

## 2015-10-26 NOTE — Telephone Encounter (Signed)
Received letter stating that the request for coverage of Kelly [name] is denied.  Kelly [name] is covered when the member has tried both Belviq and Qsymia and they did not work, or were not tolerated.  What would be your recommendation.  Would you like to change to one of the covered medications or would you like to do an appeal.  Please advise.//AB/CMA

## 2015-10-27 NOTE — Telephone Encounter (Signed)
Ask pat if she is willing to try belviq-- if yes do xr 10 mg 1 po qd #30  2 refills F/u 4-6 weeks

## 2015-10-31 NOTE — Telephone Encounter (Signed)
Left message to return call 

## 2015-10-31 NOTE — Telephone Encounter (Signed)
Relation to [id number] Call back number: [phone]    Reason for call:  Patient checking on the status of message below.

## 2015-11-01 MED ORDER — LORCASERIN HCL ER 20 MG PO TB24: 20.0000 mg | ORAL_TABLET | Freq: Every day | ORAL | 2 refills | Status: DC

## 2015-11-01 NOTE — Telephone Encounter (Signed)
Left message to return call. When patient returns call, the information below can be given by any clinical staff member.

## 2015-11-01 NOTE — Telephone Encounter (Signed)
Patient agreed to take Belviq. Rx printed and will fax to the patient's preferred pharmacy.

## 2015-11-01 NOTE — Telephone Encounter (Signed)
Pt Is returning RN's call. Please call back to advise.    cb [phone] Cell

## 2015-11-09 NOTE — Telephone Encounter (Signed)
Pt called back in she says that she would like to follow up on PA. Informed of the current status. Pt says that she would like to have someone to look into further. She says that completing PA's via system takes longer and that she would like to have someone to call.    Please advise and assist pt further.

## 2015-11-11 ENCOUNTER — Ambulatory Visit: Payer: BLUE CROSS/BLUE SHIELD | Admitting: Family Medicine

## 2015-11-14 NOTE — Telephone Encounter (Signed)
Angie would you re-submit the PA.     KP

## 2015-12-02 ENCOUNTER — Ambulatory Visit (INDEPENDENT_AMBULATORY_CARE_PROVIDER_SITE_OTHER): Payer: BLUE CROSS/BLUE SHIELD | Admitting: Family Medicine

## 2015-12-02 VITALS — BP 118/78 | HR 90 | Temp 98.6°F | Ht 64.0 in | Wt 245.4 lb

## 2015-12-02 DIAGNOSIS — R002 Palpitations: Secondary | ICD-10-CM

## 2015-12-02 DIAGNOSIS — R601 Generalized edema: Secondary | ICD-10-CM | POA: Diagnosis not present

## 2015-12-02 DIAGNOSIS — R000 Tachycardia, unspecified: Secondary | ICD-10-CM

## 2015-12-02 DIAGNOSIS — M1990 Unspecified osteoarthritis, unspecified site: Secondary | ICD-10-CM

## 2015-12-02 DIAGNOSIS — F419 Anxiety disorder, unspecified: Secondary | ICD-10-CM

## 2015-12-02 MED ORDER — ALPRAZOLAM 0.25 MG PO TABS: 0.2500 mg | ORAL_TABLET | Freq: Three times a day (TID) | ORAL | 2 refills | Status: DC | PRN

## 2015-12-02 MED ORDER — FUROSEMIDE 20 MG PO TABS: 20.0000 mg | ORAL_TABLET | Freq: Every day | ORAL | 3 refills | Status: DC

## 2015-12-02 MED ORDER — TRAMADOL HCL 50 MG PO TABS: 50.0000 mg | ORAL_TABLET | Freq: Every day | ORAL | 2 refills | Status: DC

## 2015-12-02 NOTE — Progress Notes (Signed)
Pre visit review using our clinic review tool, if applicable. No additional management support is needed unless otherwise documented below in the visit note. 

## 2015-12-02 NOTE — Patient Instructions (Signed)

## 2015-12-02 NOTE — Progress Notes (Signed)
Patient ID: Kelly [name], female    DOB: 08-29-1965  Age: 50 y.o. MRN: [id number]    Subjective:  Subjective  HPI Kelly [name] presents for F/U weight-- she is exercise and is trying to eat healthy.  Still waiting for Pa for belviq.-- her ins would let her get saxenda until she tried and failed belviq and qsymia.    Review of Systems  Constitutional: Negative for appetite change, diaphoresis, fatigue and unexpected weight change.  Eyes: Negative for pain, redness and visual disturbance.  Respiratory: Negative for cough, chest tightness, shortness of breath and wheezing.   Cardiovascular: Negative for chest pain, palpitations and leg swelling.  Endocrine: Negative for cold intolerance, heat intolerance, polydipsia, polyphagia and polyuria.  Genitourinary: Negative for difficulty urinating, dysuria and frequency.  Neurological: Negative for dizziness, light-headedness, numbness and headaches.    History Past Medical History:  Diagnosis Date  . Allergic rhinitis   . Anxiety   . Arthritis    feet, rt knee  . Depression with anxiety   . Heart murmur   . History of hiatal hernia    repaur 2011   . Hypertension   . Hypothyroidism   . 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 Morbid obesity ([place]) 2009  . PAC (premature atrial contraction)   . Pedunculated colonic polyp 03/04/15   tubulovillous adenoma of transverse colon.   . Pneumonia    hx of as a child   . PONV (postoperative nausea and vomiting)   . PVC (premature ventricular contraction)   . Thyroid goiter     She has a past surgical history that includes Laparoscopic gastric banding (05-23-09); Liposuction (1995); and Laparoscopic right hemi colectomy (Right, 03/25/2015).   Her family history includes Alzheimer's disease in her paternal grandfather; Aneurysm (age of onset: 79) in her mother; Diabetes in her paternal aunt; Heart disease in her maternal grandfather; Heart disease (age of onset: 64) in her  paternal grandmother; Hypertension in her father, maternal grandmother, mother, and paternal grandmother.She reports that she has never smoked. She has never used smokeless tobacco. She reports that she drinks about 1.8 oz of alcohol per week . She reports that she does not use drugs.  Current Outpatient Prescriptions on File Prior to Visit  Medication Sig Dispense Refill  . Cholecalciferol (VITAMIN D) 2000 UNITS CAPS Take by mouth daily.    Marland Kitchen ibuprofen (ADVIL,MOTRIN) 200 MG tablet You can take 2-3 tablets every 6 hours as needed for pain. 30 tablet 0  . levothyroxine (SYNTHROID, LEVOTHROID) 50 MCG tablet Take 50 mcg by mouth daily before breakfast.    . naproxen (NAPROSYN) 500 MG tablet Take 1 tablet by mouth as needed.  0  . valACYclovir (VALTREX) 1000 MG tablet Take 500 mg by mouth daily.     . Vitamin D, Ergocalciferol, (DRISDOL) 50000 units CAPS capsule Take 1 capsule by mouth once a week.  2   No current facility-administered medications on file prior to visit.      Objective:  Objective  Physical Exam  Constitutional: She is oriented to person, place, and time. She appears well-developed and well-nourished.  HENT:  Head: Normocephalic and atraumatic.  Eyes: Conjunctivae and EOM are normal.  Neck: Normal range of motion. Neck supple. No JVD present. Carotid bruit is not present. No thyromegaly present.  Cardiovascular: Normal rate, regular rhythm and normal heart sounds.   No murmur heard. Pulmonary/Chest: Effort normal and breath sounds normal. No respiratory distress. She has no wheezes. She  has no rales. She exhibits no tenderness.  Musculoskeletal: She exhibits edema.  Neurological: She is alert and oriented to person, place, and time.  Psychiatric: She has a normal mood and affect. Her behavior is normal. Judgment and thought content normal.  Nursing note and vitals reviewed.  BP 118/78 (BP Location: Left Arm, Patient Position: Sitting, Cuff Size: Normal)   Pulse 90   Temp  98.6 F (37 C) (Oral)   Ht 5\' 4"  (1.626 m)   Wt 245 lb 6.4 oz (111.3 kg)   SpO2 98%   BMI 42.12 kg/m  Wt Readings from Last 3 Encounters:  12/02/15 245 lb 6.4 oz (111.3 kg)  10/14/15 242 lb 6.4 oz (110 kg)  07/15/15 236 lb 6.4 oz (107.2 kg)     Lab Results  Component Value Date   WBC 14.8 (H) 03/26/2015   HGB 12.2 03/26/2015   HCT 37.1 03/26/2015   PLT 391 03/26/2015   GLUCOSE 156 (H) 03/26/2015   CHOL 164 01/10/2015   TRIG 92.0 01/10/2015   HDL 62.00 01/10/2015   LDLCALC 83 01/10/2015   ALT 11 (L) 03/25/2015   AST 16 03/25/2015   NA 137 03/26/2015   K 4.3 03/26/2015   CL 106 03/26/2015   CREATININE 0.84 03/26/2015   BUN 6 03/26/2015   CO2 23 03/26/2015   TSH 2.92 01/10/2015   HGBA1C 5.2 03/25/2015   MICROALBUR <0.7 01/10/2015    US Breast Ltd Uni Left Inc Axilla  Result Date: 06/24/2015 CLINICAL DATA:  Short-term followup for a probably benign lesion in the left breast at 9 o'clock. EXAM: 2D DIGITAL DIAGNOSTIC LEFT MAMMOGRAM WITH CAD AND ADJUNCT TOMO ULTRASOUND LEFT BREAST COMPARISON:  Previous exam(s). ACR Breast Density Category b: There are scattered areas of fibroglandular density. FINDINGS: The mass seen in the anterior upper outer left breast appears smaller. This was shown to be a simple cyst. The opacity seen more posteriorly in the medial left breast is less apparent. There are no new abnormalities. There are no suspicious calcifications. Mammographic images were processed with CAD. Targeted ultrasound is performed, showing a small hypoechoic lesion in the left breast at 9 o'clock, 2 cm from the nipple currently measuring 4.1 x 1.7 x 3.2 mm, decreased in size the prior study. IMPRESSION: No evidence of malignancy. Benign left breast lesion likely a small cluster of cysts or focal fibrocystic change. RECOMMENDATION: Screening mammogram in September 2017 per normal screening schedule.(Code:SM-B-01Y) I have discussed the findings and recommendations with the patient.  Results were also provided in writing at the conclusion of the visit. If applicable, a reminder letter will be sent to the patient regarding the next appointment. BI-RADS CATEGORY  2: Benign. Electronically Signed   By: Lajean Manes M.D.   On: 06/24/2015 14:52   Mm Diag Breast Tomo Uni Left  Result Date: 06/24/2015 CLINICAL DATA:  Short-term followup for a probably benign lesion in the left breast at 9 o'clock. EXAM: 2D DIGITAL DIAGNOSTIC LEFT MAMMOGRAM WITH CAD AND ADJUNCT TOMO ULTRASOUND LEFT BREAST COMPARISON:  Previous exam(s). ACR Breast Density Category b: There are scattered areas of fibroglandular density. FINDINGS: The mass seen in the anterior upper outer left breast appears smaller. This was shown to be a simple cyst. The opacity seen more posteriorly in the medial left breast is less apparent. There are no new abnormalities. There are no suspicious calcifications. Mammographic images were processed with CAD. Targeted ultrasound is performed, showing a small hypoechoic lesion in the left breast at 9 o'clock,  2 cm from the nipple currently measuring 4.1 x 1.7 x 3.2 mm, decreased in size the prior study. IMPRESSION: No evidence of malignancy. Benign left breast lesion likely a small cluster of cysts or focal fibrocystic change. RECOMMENDATION: Screening mammogram in September 2017 per normal screening schedule.(Code:SM-B-01Y) I have discussed the findings and recommendations with the patient. Results were also provided in writing at the conclusion of the visit. If applicable, a reminder letter will be sent to the patient regarding the next appointment. BI-RADS CATEGORY  2: Benign. Electronically Signed   By: Lajean Manes M.D.   On: 06/24/2015 14:52     Assessment & Plan:  Plan  I have discontinued Kelly [name]'s hydrochlorothiazide, Liraglutide -Weight Management, and Lorcaserin HCl ER. I am also having her start on furosemide. Additionally, I am having her maintain her valACYclovir, levothyroxine,  Vitamin D, ibuprofen, Vitamin D (Ergocalciferol), naproxen, traMADol, and ALPRAZolam.  Meds ordered this encounter  Medications  . furosemide (LASIX) 20 MG tablet    Sig: Take 1 tablet (20 mg total) by mouth daily.    Dispense:  30 tablet    Refill:  3  . traMADol (ULTRAM) 50 MG tablet    Sig: Take 1-2 tablets (50-100 mg total) by mouth daily.    Dispense:  30 tablet    Refill:  2  . ALPRAZolam (XANAX) 0.25 MG tablet    Sig: Take 1 tablet (0.25 mg total) by mouth 3 (three) times daily as needed for anxiety.    Dispense:  60 tablet    Refill:  2    Problem List Items Addressed This Visit      Unprioritized   EDEMA - Primary   Relevant Medications   furosemide (LASIX) 20 MG tablet   Palpitations   Relevant Medications   ALPRAZolam (XANAX) 0.25 MG tablet   MORBID OBESITY    PA belviq pending-- hopefully we will have an answer by Monday rto 1 month or sooner prn       Other Visit Diagnoses    Arthritis       Relevant Medications   traMADol (ULTRAM) 50 MG tablet   Tachycardia       Relevant Medications   ALPRAZolam (XANAX) 0.25 MG tablet   Anxiety disorder, unspecified anxiety disorder type       Relevant Medications   ALPRAZolam (XANAX) 0.25 MG tablet      Follow-up: Return in about 4 weeks (around 12/30/2015) for weight check.  Ann Held, DO

## 2015-12-02 NOTE — Telephone Encounter (Signed)
PA initiated for Belviq XR on [email or web address].  [id number].  Also called [employer] and spoke with Nira Conn) and she will be faxing Utilization Management Criteria for coverage.   Placed in folder for Dr. Carollee Herter to review.//AB/CMA

## 2015-12-04 ENCOUNTER — Encounter: Payer: Self-pay | Admitting: Family Medicine

## 2015-12-04 NOTE — Assessment & Plan Note (Signed)
PA belviq pending-- hopefully we will have an answer by Monday rto 1 month or sooner prn

## 2015-12-05 NOTE — Telephone Encounter (Addendum)
Received approval letter for the Belviq XR 20mg .  Effective dates of the authorization:(12/02/15-05/29/16).  Called pharmacy with the approval.  Pharmacy stated that medication went through and they will complete the process.  Called and [facility] @ 9:53am @ [phone]) informing the pt that the Belviq was approved and the pharmacy with complete the process.  Asked the pt to give the pharmacy a call to see when the medication will be ready for her to pick up.//AB/CMA

## 2015-12-05 NOTE — Telephone Encounter (Signed)
Received coverage authorization criteria for the Belviq XR 20mg  back from Dr. Carollee Herter.  Prior review/certification filled out and faxed to [facility].  Confirmation received.   Awaiting response.//AB/CMA

## 2015-12-16 ENCOUNTER — Telehealth: Payer: Self-pay | Admitting: Family Medicine

## 2015-12-16 NOTE — Telephone Encounter (Signed)
Pt says that provider prescribed Belviq. She says that it isn't working of her. It is causing muscle aches, headaches. She would like a call back to discuss further.   CB: [phone]

## 2015-12-19 ENCOUNTER — Other Ambulatory Visit: Payer: Self-pay | Admitting: Family Medicine

## 2015-12-19 MED ORDER — PHENTERMINE-TOPIRAMATE ER 3.75-23 MG PO CP24: ORAL_CAPSULE | ORAL | 0 refills | Status: DC

## 2015-12-19 NOTE — Telephone Encounter (Signed)
She wants to try the Saxenda.  KP

## 2015-12-19 NOTE — Telephone Encounter (Signed)
Spoke with the patient and she stated the Belviq caused the below symptoms and it has resolved since she stopped it. She has also taken phentermine with no effect/elevated BP as well as Contrave with no results, she is watching her diet and exercising and would like to try something else. PA was declined for Saxenda until she tried another medication. Please advise    KP

## 2015-12-19 NOTE — Telephone Encounter (Signed)
qsymia printed--- low dose for 2 weeks then we can increase to next dose if not problems --- may not see weight loss yet at that time

## 2015-12-19 NOTE — Telephone Encounter (Signed)
I thought saxenda was denied because she had to try belviq and qsymia first--- am I wrong?

## 2015-12-19 NOTE — Telephone Encounter (Signed)
I made the patient aware of the below and she verbalized understanding, she has agreed to take it. Rx faxed.     KP

## 2015-12-21 ENCOUNTER — Telehealth: Payer: Self-pay

## 2015-12-21 NOTE — Telephone Encounter (Signed)
Kelly [name] ([name]) [id number] Qsymia 3.75-23MG  er capsules 1 po qd x's 2 weeks #14 for 14 days Status: PA Request Created: September 27th, 2017 Sent: September 27th, 2017   Initiated via covermymeds awaiting approval    KP

## 2015-12-21 NOTE — Telephone Encounter (Addendum)
  Kelly [name] (Kelly [name]) [id number] Qsymia 3.75-23MG  er capsules Status: PA Response - Approved Created: September 27th, 2017 Sent: September 27th, 2017 Effective from 12/21/2015 through 06/17/2016.

## 2016-01-03 ENCOUNTER — Telehealth: Payer: Self-pay | Admitting: Family Medicine

## 2016-01-03 NOTE — Telephone Encounter (Signed)
Patient stated she took her last pill today of the Phentermine-Topiramate 3.75-23 MG CP24 w/o side effects and wanted to know what to do next. Can she go up to the next dose? Her follow up apt is scheduled for Friday.  Please advise     KP

## 2016-01-03 NOTE — Telephone Encounter (Signed)
Caller name: Relationship to patient: Self Can be reached: [phone] Pharmacy:  Reason for call: Request call back from [facility] to discuss medication titration

## 2016-01-03 NOTE — Telephone Encounter (Signed)
Message left to call the office.    KP 

## 2016-01-03 NOTE — Telephone Encounter (Signed)
Can increase the Qsymia to 7.5/46 dose, 1 tab po dialy, disp #30

## 2016-01-06 ENCOUNTER — Ambulatory Visit (INDEPENDENT_AMBULATORY_CARE_PROVIDER_SITE_OTHER): Payer: BLUE CROSS/BLUE SHIELD | Admitting: Family Medicine

## 2016-01-06 VITALS — BP 138/80 | HR 88 | Temp 98.2°F | Resp 16 | Ht 64.0 in | Wt 238.0 lb

## 2016-01-06 DIAGNOSIS — I10 Essential (primary) hypertension: Secondary | ICD-10-CM | POA: Diagnosis not present

## 2016-01-06 MED ORDER — PHENTERMINE-TOPIRAMATE ER 7.5-46 MG PO CP24: 1.0000 | ORAL_CAPSULE | Freq: Every day | ORAL | 0 refills | Status: DC

## 2016-01-06 MED ORDER — PHENTERMINE-TOPIRAMATE ER 7.5-46 MG PO CP24: ORAL_CAPSULE | ORAL | 2 refills | Status: DC

## 2016-01-06 NOTE — Telephone Encounter (Signed)
Patient aware Rx has been approved and is being faxed.    KP

## 2016-01-06 NOTE — Progress Notes (Signed)
Pre visit review using our clinic review tool, if applicable. No additional management support is needed unless otherwise documented below in the visit note. 

## 2016-01-06 NOTE — Progress Notes (Signed)
}{{   Subjective:  Subjective  HPI MAKENSY VIANDS presents for f/u weight , bp and anxiety She feels like the qsymia is working for her so far.   Review of Systems  Constitutional: Negative for appetite change, diaphoresis, fatigue and unexpected weight change.  Eyes: Negative for pain, redness and visual disturbance.  Respiratory: Negative for cough, chest tightness, shortness of breath and wheezing.   Cardiovascular: Negative for chest pain, palpitations and leg swelling.  Endocrine: Negative for cold intolerance, heat intolerance, polydipsia, polyphagia and polyuria.  Genitourinary: Negative for difficulty urinating, dysuria and frequency.  Neurological: Negative for dizziness, light-headedness, numbness and headaches.    History Past Medical History:  Diagnosis Date  . Allergic rhinitis   . Anxiety   . Arthritis    feet, rt knee  . Depression with anxiety   . Heart murmur   . History of hiatal hernia    repaur 2011   . Hypertension   . Hypothyroidism   . 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 Morbid obesity ([place]) 2009  . PAC (premature atrial contraction)   . Pedunculated colonic polyp 03/04/15   tubulovillous adenoma of transverse colon.   . Pneumonia    hx of as a child   . PONV (postoperative nausea and vomiting)   . PVC (premature ventricular contraction)   . Thyroid goiter     She has a past surgical history that includes Laparoscopic gastric banding (05-23-09); Liposuction (1995); and Laparoscopic right hemi colectomy (Right, 03/25/2015).   Her family history includes Alzheimer's disease in her paternal grandfather; Aneurysm (age of onset: 29) in her mother; Diabetes in her paternal aunt; Heart disease in her maternal grandfather; Heart disease (age of onset: 17) in her paternal grandmother; Hypertension in her father, maternal grandmother, mother, and paternal grandmother.She reports that she has never smoked. She has never used smokeless  tobacco. She reports that she drinks about 1.8 oz of alcohol per week . She reports that she does not use drugs.  Current Outpatient Prescriptions on File Prior to Visit  Medication Sig Dispense Refill  . ALPRAZolam (XANAX) 0.25 MG tablet Take 1 tablet (0.25 mg total) by mouth 3 (three) times daily as needed for anxiety. 60 tablet 2  . Cholecalciferol (VITAMIN D) 2000 UNITS CAPS Take by mouth daily.    . furosemide (LASIX) 20 MG tablet Take 1 tablet (20 mg total) by mouth daily. 30 tablet 3  . ibuprofen (ADVIL,MOTRIN) 200 MG tablet You can take 2-3 tablets every 6 hours as needed for pain. 30 tablet 0  . levothyroxine (SYNTHROID, LEVOTHROID) 50 MCG tablet Take 50 mcg by mouth daily before breakfast.    . naproxen (NAPROSYN) 500 MG tablet Take 1 tablet by mouth as needed.  0  . traMADol (ULTRAM) 50 MG tablet Take 1-2 tablets (50-100 mg total) by mouth daily. 30 tablet 2  . valACYclovir (VALTREX) 1000 MG tablet Take 500 mg by mouth daily.      No current facility-administered medications on file prior to visit.      Objective:  Objective  Physical Exam  Constitutional: She is oriented to person, place, and time. She appears well-developed and well-nourished.  HENT:  Head: Normocephalic and atraumatic.  Eyes: Conjunctivae and EOM are normal.  Neck: Normal range of motion. Neck supple. No JVD present. Carotid bruit is not present. No thyromegaly present.  Cardiovascular: Normal rate, regular rhythm and normal heart sounds.   No murmur heard. Pulmonary/Chest: Effort normal and breath  sounds normal. No respiratory distress. She has no wheezes. She has no rales. She exhibits no tenderness.  Musculoskeletal: She exhibits no edema.  Neurological: She is alert and oriented to person, place, and time.  Psychiatric: She has a normal mood and affect. Her behavior is normal. Judgment and thought content normal.  Nursing note and vitals reviewed.  BP 138/80 (BP Location: Left Arm, Patient Position:  Sitting, Cuff Size: Normal)   Pulse 88   Temp 98.2 F (36.8 C) (Oral)   Resp 16   Ht 5\' 4"  (1.626 m)   Wt 238 lb (108 kg)   SpO2 98%   BMI 40.85 kg/m  Wt Readings from Last 3 Encounters:  01/06/16 238 lb (108 kg)  12/02/15 245 lb 6.4 oz (111.3 kg)  10/14/15 242 lb 6.4 oz (110 kg)     Lab Results  Component Value Date   WBC 14.8 (H) 03/26/2015   HGB 12.2 03/26/2015   HCT 37.1 03/26/2015   PLT 391 03/26/2015   GLUCOSE 156 (H) 03/26/2015   CHOL 164 01/10/2015   TRIG 92.0 01/10/2015   HDL 62.00 01/10/2015   LDLCALC 83 01/10/2015   ALT 11 (L) 03/25/2015   AST 16 03/25/2015   NA 137 03/26/2015   K 4.3 03/26/2015   CL 106 03/26/2015   CREATININE 0.84 03/26/2015   BUN 6 03/26/2015   CO2 23 03/26/2015   TSH 2.92 01/10/2015   HGBA1C 5.2 03/25/2015   MICROALBUR <0.7 01/10/2015    US Breast Ltd Uni Left Inc Axilla  Result Date: 06/24/2015 CLINICAL DATA:  Short-term followup for a probably benign lesion in the left breast at 9 o'clock. EXAM: 2D DIGITAL DIAGNOSTIC LEFT MAMMOGRAM WITH CAD AND ADJUNCT TOMO ULTRASOUND LEFT BREAST COMPARISON:  Previous exam(s). ACR Breast Density Category b: There are scattered areas of fibroglandular density. FINDINGS: The mass seen in the anterior upper outer left breast appears smaller. This was shown to be a simple cyst. The opacity seen more posteriorly in the medial left breast is less apparent. There are no new abnormalities. There are no suspicious calcifications. Mammographic images were processed with CAD. Targeted ultrasound is performed, showing a small hypoechoic lesion in the left breast at 9 o'clock, 2 cm from the nipple currently measuring 4.1 x 1.7 x 3.2 mm, decreased in size the prior study. IMPRESSION: No evidence of malignancy. Benign left breast lesion likely a small cluster of cysts or focal fibrocystic change. RECOMMENDATION: Screening mammogram in September 2017 per normal screening schedule.(Code:SM-B-01Y) I have discussed the  findings and recommendations with the patient. Results were also provided in writing at the conclusion of the visit. If applicable, a reminder letter will be sent to the patient regarding the next appointment. BI-RADS CATEGORY  2: Benign. Electronically Signed   By: Lajean Manes M.D.   On: 06/24/2015 14:52   Mm Diag Breast Tomo Uni Left  Result Date: 06/24/2015 CLINICAL DATA:  Short-term followup for a probably benign lesion in the left breast at 9 o'clock. EXAM: 2D DIGITAL DIAGNOSTIC LEFT MAMMOGRAM WITH CAD AND ADJUNCT TOMO ULTRASOUND LEFT BREAST COMPARISON:  Previous exam(s). ACR Breast Density Category b: There are scattered areas of fibroglandular density. FINDINGS: The mass seen in the anterior upper outer left breast appears smaller. This was shown to be a simple cyst. The opacity seen more posteriorly in the medial left breast is less apparent. There are no new abnormalities. There are no suspicious calcifications. Mammographic images were processed with CAD. Targeted ultrasound is performed, showing  a small hypoechoic lesion in the left breast at 9 o'clock, 2 cm from the nipple currently measuring 4.1 x 1.7 x 3.2 mm, decreased in size the prior study. IMPRESSION: No evidence of malignancy. Benign left breast lesion likely a small cluster of cysts or focal fibrocystic change. RECOMMENDATION: Screening mammogram in September 2017 per normal screening schedule.(Code:SM-B-01Y) I have discussed the findings and recommendations with the patient. Results were also provided in writing at the conclusion of the visit. If applicable, a reminder letter will be sent to the patient regarding the next appointment. BI-RADS CATEGORY  2: Benign. Electronically Signed   By: Lajean Manes M.D.   On: 06/24/2015 14:52     Assessment & Plan:  Plan  I have discontinued Ms. Mosso's Vitamin D (Ergocalciferol). I am also having her maintain her valACYclovir, levothyroxine, Vitamin D, ibuprofen, naproxen, furosemide,  traMADol, ALPRAZolam, and Phentermine-Topiramate.  Meds ordered this encounter  Medications  . DISCONTD: Phentermine-Topiramate 7.5-46 MG CP24    Sig: 1 po qd    Dispense:  30 capsule    Refill:  2  . Phentermine-Topiramate 7.5-46 MG CP24    Sig: 1 po qd    Dispense:  30 capsule    Refill:  2    Problem List Items Addressed This Visit      Unprioritized   Essential hypertension - Primary    Stable with diet and exercise  con't with weight loss      Obesity, Class III, BMI 40-49.9 (morbid obesity) (HCC)    con't with qsymia con't diet and exercise rto 2 months      Relevant Medications   Phentermine-Topiramate 7.5-46 MG CP24    Other Visit Diagnoses   None.     Follow-up: Return in about 2 months (around 03/07/2016).  Ann Held, DO

## 2016-01-06 NOTE — Patient Instructions (Signed)
Obesity Obesity is defined as having too much total body fat and a body mass index (BMI) of 30 or more. BMI is an estimate of body fat and is calculated from your height and weight. BMI is typically calculated by your health care provider during regular wellness visits. Obesity happens when you consume more calories than you can burn by exercising or performing daily physical tasks. Prolonged obesity can cause major illnesses or emergencies, such as:  Stroke.  Heart disease.  Diabetes.  Cancer.  Arthritis.  High blood pressure (hypertension).  High cholesterol.  Sleep apnea.  Erectile dysfunction.  Infertility problems. CAUSES   Regularly eating unhealthy foods.  Physical inactivity.  Certain disorders, such as an underactive thyroid (hypothyroidism), Cushing's syndrome, and polycystic ovarian syndrome.  Certain medicines, such as steroids, some depression medicines, and antipsychotics.  Genetics.  Lack of sleep. DIAGNOSIS A health care provider can diagnose obesity after calculating your BMI. Obesity will be diagnosed if your BMI is 30 or higher. There are other methods of measuring obesity levels. Some other methods include measuring your skinfold thickness, your waist circumference, and comparing your hip circumference to your waist circumference. TREATMENT  A healthy treatment program includes some or all of the following:  Long-term dietary changes.  Exercise and physical activity.  Behavioral and lifestyle changes.  Medicine only under the supervision of your health care provider. Medicines may help, but only if they are used with diet and exercise programs. If your BMI is 40 or higher, your health care provider may recommend specialized surgery or programs to help with weight loss. An unhealthy treatment program includes:  Fasting.  Fad diets.  Supplements and drugs. These choices do not succeed in long-term weight control. HOME CARE  INSTRUCTIONS  Exercise and perform physical activity as directed by your health care provider. To increase physical activity, try the following:  Use stairs instead of elevators.  Park farther away from store entrances.  Garden, bike, or walk instead of watching television or using the computer.  Eat healthy, low-calorie foods and drinks on a regular basis. Eat more fruits and vegetables. Use low-calorie cookbooks or take healthy cooking classes.  Limit fast food, sweets, and processed snack foods.  Eat smaller portions.  Keep a daily journal of everything you eat. There are many free websites to help you with this. It may be helpful to measure your foods so you can determine if you are eating the correct portion sizes.  Avoid drinking alcohol. Drink more water and drinks without calories.  Take vitamins and supplements only as recommended by your health care provider.  Weight-loss support groups, registered dietitians, counselors, and stress reduction education can also be very helpful. SEEK IMMEDIATE MEDICAL CARE IF:  You have chest pain or tightness.  You have trouble breathing or feel short of breath.  You have weakness or leg numbness.  You feel confused or have trouble talking.  You have sudden changes in your vision.   This information is not intended to replace advice given to you by your health care provider. Make sure you discuss any questions you have with your health care provider.   Document Released: 04/19/2004 Document Revised: 04/02/2014 Document Reviewed: 04/18/2011 Elsevier Interactive Patient Education 2016 [employer] Inc[employer]  

## 2016-01-07 ENCOUNTER — Encounter: Payer: Self-pay | Admitting: Family Medicine

## 2016-01-07 NOTE — Assessment & Plan Note (Signed)
Stable with diet and exercise  con't with weight loss

## 2016-01-07 NOTE — Assessment & Plan Note (Signed)
con't with qsymia con't diet and exercise rto 2 months

## 2016-02-01 ENCOUNTER — Other Ambulatory Visit: Payer: Self-pay | Admitting: Obstetrics and Gynecology

## 2016-02-01 DIAGNOSIS — R928 Other abnormal and inconclusive findings on diagnostic imaging of breast: Secondary | ICD-10-CM

## 2016-02-03 ENCOUNTER — Telehealth: Payer: Self-pay

## 2016-02-03 ENCOUNTER — Other Ambulatory Visit: Payer: Self-pay | Admitting: Family Medicine

## 2016-02-03 ENCOUNTER — Telehealth: Payer: Self-pay | Admitting: Family Medicine

## 2016-02-03 MED ORDER — PHENTERMINE-TOPIRAMATE ER 15-92 MG PO CP24: ORAL_CAPSULE | ORAL | 0 refills | Status: DC

## 2016-02-03 NOTE — Telephone Encounter (Signed)
Caller name: Relationship to patient: Self Can be reached: [phone]  Pharmacy:  Reason for call: States that the middle dose of Qsymia is not working to curve her appetite and she needs to be increased. States her BP is good and she will schedule to come in if she needs to be seen prior to the increase. Plse adv.

## 2016-02-03 NOTE — Telephone Encounter (Signed)
Reason for call: States that the middle dose of Qsymia is not working to curve her appetite and she needs to be increased. States her BP is good and she will schedule to come in if she needs to be seen prior to the increase. Plse adv.

## 2016-02-03 NOTE — Telephone Encounter (Signed)
I printed the higher dose

## 2016-02-06 NOTE — Telephone Encounter (Signed)
Per PCP Rx has been increased and sent to pharmacy. LB

## 2016-02-07 NOTE — Telephone Encounter (Signed)
I have faxed Rx for Phentermine- Topirmate #30 capsules #0 refills to [facility] [phone]. TL/cMA

## 2016-02-07 NOTE — Telephone Encounter (Signed)
Rx printed and faxed to pharmacy. LB

## 2016-02-10 ENCOUNTER — Ambulatory Visit
Admission: RE | Admit: 2016-02-10 | Discharge: 2016-02-10 | Disposition: A | Discharge status: Home / Self Care | Payer: BLUE CROSS/BLUE SHIELD | Source: Ambulatory Visit | Attending: Obstetrics and Gynecology | Admitting: Obstetrics and Gynecology

## 2016-02-10 DIAGNOSIS — R928 Other abnormal and inconclusive findings on diagnostic imaging of breast: Secondary | ICD-10-CM

## 2016-03-09 ENCOUNTER — Ambulatory Visit (INDEPENDENT_AMBULATORY_CARE_PROVIDER_SITE_OTHER): Payer: BLUE CROSS/BLUE SHIELD | Admitting: Family Medicine

## 2016-03-09 ENCOUNTER — Encounter: Payer: Self-pay | Admitting: Family Medicine

## 2016-03-09 DIAGNOSIS — R601 Generalized edema: Secondary | ICD-10-CM

## 2016-03-09 MED ORDER — FUROSEMIDE 20 MG PO TABS: 20.0000 mg | ORAL_TABLET | Freq: Every day | ORAL | 3 refills | Status: DC

## 2016-03-09 MED ORDER — LORCASERIN HCL 10 MG PO TABS: ORAL_TABLET | ORAL | 2 refills | Status: DC

## 2016-03-09 NOTE — Patient Instructions (Signed)

## 2016-03-09 NOTE — Progress Notes (Signed)
Pre visit review using our clinic review tool, if applicable. No additional management support is needed unless otherwise documented below in the visit note. 

## 2016-03-09 NOTE — Progress Notes (Signed)
Patient ID: Kelly [name], female    DOB: 1965-05-08  Age: 50 y.o. MRN: [id number]    Subjective:  Subjective  HPI Kelly ORLOSKY presents for f/u weight. She did not like the qsymia  Review of Systems  Constitutional: Negative for activity change, appetite change, diaphoresis, fatigue and unexpected weight change.  Eyes: Negative for pain, redness and visual disturbance.  Respiratory: Negative for cough, chest tightness, shortness of breath and wheezing.   Cardiovascular: Negative for chest pain, palpitations and leg swelling.  Endocrine: Negative for cold intolerance, heat intolerance, polydipsia, polyphagia and polyuria.  Genitourinary: Negative for difficulty urinating, dysuria and frequency.  Neurological: Negative for dizziness, light-headedness, numbness and headaches.  Psychiatric/Behavioral: Negative for behavioral problems and dysphoric mood. The patient is not nervous/anxious.     History Past Medical History:  Diagnosis Date  . Allergic rhinitis   . Anxiety   . Arthritis    feet, rt knee  . Depression with anxiety   . Heart murmur   . History of hiatal hernia    repaur 2011   . Hypertension   . Hypothyroidism   . 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 Morbid obesity ([place]) 2009  . PAC (premature atrial contraction)   . Pedunculated colonic polyp 03/04/15   tubulovillous adenoma of transverse colon.   . Pneumonia    hx of as a child   . PONV (postoperative nausea and vomiting)   . PVC (premature ventricular contraction)   . Thyroid goiter     She has a past surgical history that includes Laparoscopic gastric banding (05-23-09); Liposuction (1995); and Laparoscopic right hemi colectomy (Right, 03/25/2015).   Her family history includes Alzheimer's disease in her paternal grandfather; Aneurysm (age of onset: 35) in her mother; Diabetes in her paternal aunt; Heart disease in her maternal grandfather; Heart disease (age of onset: 82) in her  paternal grandmother; Hypertension in her father, maternal grandmother, mother, and paternal grandmother.She reports that she has never smoked. She has never used smokeless tobacco. She reports that she drinks about 1.8 oz of alcohol per week . She reports that she does not use drugs.  Current Outpatient Prescriptions on File Prior to Visit  Medication Sig Dispense Refill  . ALPRAZolam (XANAX) 0.25 MG tablet Take 1 tablet (0.25 mg total) by mouth 3 (three) times daily as needed for anxiety. 60 tablet 2  . Cholecalciferol (VITAMIN D) 2000 UNITS CAPS Take by mouth daily.    Marland Kitchen ibuprofen (ADVIL,MOTRIN) 200 MG tablet You can take 2-3 tablets every 6 hours as needed for pain. 30 tablet 0  . levothyroxine (SYNTHROID, LEVOTHROID) 50 MCG tablet Take 50 mcg by mouth daily before breakfast.    . naproxen (NAPROSYN) 500 MG tablet Take 1 tablet by mouth as needed.  0  . traMADol (ULTRAM) 50 MG tablet Take 1-2 tablets (50-100 mg total) by mouth daily. 30 tablet 2  . valACYclovir (VALTREX) 1000 MG tablet Take 500 mg by mouth daily.      No current facility-administered medications on file prior to visit.      Objective:  Objective  Physical Exam  Constitutional: She is oriented to person, place, and time. She appears well-developed and well-nourished.  HENT:  Head: Normocephalic and atraumatic.  Eyes: Conjunctivae and EOM are normal.  Neck: Normal range of motion. Neck supple. No JVD present. Carotid bruit is not present. No thyromegaly present.  Cardiovascular: Normal rate, regular rhythm and normal heart sounds.   No murmur heard.  Pulmonary/Chest: Effort normal and breath sounds normal. No respiratory distress. She has no wheezes. She has no rales. She exhibits no tenderness.  Musculoskeletal: She exhibits no edema.  Neurological: She is alert and oriented to person, place, and time.  Psychiatric: She has a normal mood and affect.  Nursing note and vitals reviewed.  BP 130/78 (BP Location: Left  Arm, Patient Position: Sitting, Cuff Size: Large)   Pulse 89   Temp 98.8 F (37.1 C) (Oral)   Resp 16   Ht 5\' 4"  (1.626 m)   Wt 230 lb 3.2 oz (104.4 kg)   SpO2 99%   BMI 39.51 kg/m  Wt Readings from Last 3 Encounters:  03/09/16 230 lb 3.2 oz (104.4 kg)  01/06/16 238 lb (108 kg)  12/02/15 245 lb 6.4 oz (111.3 kg)     Lab Results  Component Value Date   WBC 14.8 (H) 03/26/2015   HGB 12.2 03/26/2015   HCT 37.1 03/26/2015   PLT 391 03/26/2015   GLUCOSE 156 (H) 03/26/2015   CHOL 164 01/10/2015   TRIG 92.0 01/10/2015   HDL 62.00 01/10/2015   LDLCALC 83 01/10/2015   ALT 11 (L) 03/25/2015   AST 16 03/25/2015   NA 137 03/26/2015   K 4.3 03/26/2015   CL 106 03/26/2015   CREATININE 0.84 03/26/2015   BUN 6 03/26/2015   CO2 23 03/26/2015   TSH 2.92 01/10/2015   HGBA1C 5.2 03/25/2015   MICROALBUR <0.7 01/10/2015    US Breast Ltd Uni Left Inc Axilla  Result Date: 02/10/2016 CLINICAL DATA:  Patient recalled from screening for left breast asymmetry. EXAM: 2D DIGITAL DIAGNOSTIC LEFT MAMMOGRAM WITH CAD AND ADJUNCT TOMO ULTRASOUND LEFT BREAST COMPARISON:  Previous exam(s). ACR Breast Density Category b: There are scattered areas of fibroglandular density. FINDINGS: Within the lateral aspect of the left breast on the CC view there is a persistent oval circumscribed low-density 6 mm mass. Mammographic images were processed with CAD. On physical exam, I palpate no discrete mass within the lateral left breast. Targeted ultrasound is performed, showing a 9 x 6 x 8 mm benign cluster of cysts within the left breast 3 o'clock position 2 cm from the nipple. Within the left breast 2 o'clock position 2 cm from the nipple there are 2 adjacent simple cysts measuring 7 and 8 mm respectively. IMPRESSION: Left breast benign cyst and cluster of cysts. No mammographic evidence for malignancy. RECOMMENDATION: Screening mammogram in one year.(Code:SM-B-01Y) I have discussed the findings and recommendations with  the patient. Results were also provided in writing at the conclusion of the visit. If applicable, a reminder letter will be sent to the patient regarding the next appointment. BI-RADS CATEGORY  2: Benign. Electronically Signed   By: Lovey Newcomer M.D.   On: 02/10/2016 14:03   Mm Diag Breast Tomo Uni Left  Result Date: 02/10/2016 CLINICAL DATA:  Patient recalled from screening for left breast asymmetry. EXAM: 2D DIGITAL DIAGNOSTIC LEFT MAMMOGRAM WITH CAD AND ADJUNCT TOMO ULTRASOUND LEFT BREAST COMPARISON:  Previous exam(s). ACR Breast Density Category b: There are scattered areas of fibroglandular density. FINDINGS: Within the lateral aspect of the left breast on the CC view there is a persistent oval circumscribed low-density 6 mm mass. Mammographic images were processed with CAD. On physical exam, I palpate no discrete mass within the lateral left breast. Targeted ultrasound is performed, showing a 9 x 6 x 8 mm benign cluster of cysts within the left breast 3 o'clock position 2 cm from the  nipple. Within the left breast 2 o'clock position 2 cm from the nipple there are 2 adjacent simple cysts measuring 7 and 8 mm respectively. IMPRESSION: Left breast benign cyst and cluster of cysts. No mammographic evidence for malignancy. RECOMMENDATION: Screening mammogram in one year.(Code:SM-B-01Y) I have discussed the findings and recommendations with the patient. Results were also provided in writing at the conclusion of the visit. If applicable, a reminder letter will be sent to the patient regarding the next appointment. BI-RADS CATEGORY  2: Benign. Electronically Signed   By: Lovey Newcomer M.D.   On: 02/10/2016 14:03     Assessment & Plan:  Plan  I have discontinued Ms. Dimitrov's Phentermine-Topiramate. I am also having her start on Lorcaserin HCl. Additionally, I am having her maintain her valACYclovir, levothyroxine, Vitamin D, ibuprofen, naproxen, traMADol, ALPRAZolam, and furosemide.  Meds ordered this  encounter  Medications  . Lorcaserin HCl (BELVIQ) 10 MG TABS    Sig: 1 po bid    Dispense:  60 tablet    Refill:  2  . furosemide (LASIX) 20 MG tablet    Sig: Take 1 tablet (20 mg total) by mouth daily.    Dispense:  90 tablet    Refill:  3    Problem List Items Addressed This Visit      Unprioritized   EDEMA   Relevant Medications   furosemide (LASIX) 20 MG tablet    Other Visit Diagnoses    Morbid obesity ([place])    -  Primary   Relevant Medications   Lorcaserin HCl (BELVIQ) 10 MG TABS    d/w with pt diet and exercise  Follow-up: Return in about 4 weeks (around 04/06/2016), or if symptoms worsen or fail to improve, for obesity.  Ann Held, DO

## 2016-05-04 ENCOUNTER — Encounter: Payer: Self-pay | Admitting: Family Medicine

## 2016-05-04 ENCOUNTER — Ambulatory Visit (INDEPENDENT_AMBULATORY_CARE_PROVIDER_SITE_OTHER): Payer: BLUE CROSS/BLUE SHIELD | Admitting: Family Medicine

## 2016-05-04 VITALS — BP 130/70 | HR 80 | Temp 97.9°F | Resp 16 | Ht 64.0 in | Wt 243.8 lb

## 2016-05-04 DIAGNOSIS — R601 Generalized edema: Secondary | ICD-10-CM

## 2016-05-04 DIAGNOSIS — I10 Essential (primary) hypertension: Secondary | ICD-10-CM | POA: Diagnosis not present

## 2016-05-04 DIAGNOSIS — E785 Hyperlipidemia, unspecified: Secondary | ICD-10-CM

## 2016-05-04 DIAGNOSIS — F411 Generalized anxiety disorder: Secondary | ICD-10-CM

## 2016-05-04 DIAGNOSIS — R002 Palpitations: Secondary | ICD-10-CM | POA: Diagnosis not present

## 2016-05-04 DIAGNOSIS — E039 Hypothyroidism, unspecified: Secondary | ICD-10-CM

## 2016-05-04 DIAGNOSIS — Z0000 Encounter for general adult medical examination without abnormal findings: Secondary | ICD-10-CM | POA: Diagnosis not present

## 2016-05-04 DIAGNOSIS — R000 Tachycardia, unspecified: Secondary | ICD-10-CM

## 2016-05-04 MED ORDER — LIRAGLUTIDE -WEIGHT MANAGEMENT 18 MG/3ML ~~LOC~~ SOPN: 3.0000 mg | PEN_INJECTOR | Freq: Three times a day (TID) | SUBCUTANEOUS | 0 refills | Status: DC

## 2016-05-04 NOTE — Progress Notes (Signed)
Pre visit review using our clinic review tool, if applicable. No additional management support is needed unless otherwise documented below in the visit note. 

## 2016-05-04 NOTE — Patient Instructions (Signed)

## 2016-05-04 NOTE — Progress Notes (Signed)
Subjective:    Patient ID: Kelly [name], female    DOB: 22-Jun-1965, 51 y.o.   MRN: [id number]   I acted as a Education administrator for Dr. Royden Purl, LPN   Chief Complaint  Patient presents with  . Annual Exam  . Anxiety  . Hypertension    CMA served as Education administrator during this visit. History, Physical and Plan performed by medical provider. Documentation and orders reviewed and attested to.    Anxiety  Presents for follow-up visit. Patient reports no chest pain, dizziness, nausea, nervous/anxious behavior, palpitations or shortness of breath.    Hypertension  This is a chronic problem. The current episode started more than 1 year ago. Associated symptoms include anxiety. Pertinent negatives include no blurred vision, chest pain, headaches, malaise/fatigue, palpitations or shortness of breath.    Patient is in today for annual physical, and follow up hypertension, and anxiety. She also c/o difficulty losing weight and she states the belviq made her tired and did not help her.    Past Medical History:  Diagnosis Date  . Allergic rhinitis   . Anxiety   . Arthritis    feet, rt knee  . Depression with anxiety   . Heart murmur   . History of hiatal hernia    repaur 2011   . Hypertension   . Hypothyroidism   . 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 Morbid obesity ([place]) 2009  . PAC (premature atrial contraction)   . Pedunculated colonic polyp 03/04/15   tubulovillous adenoma of transverse colon.   . Pneumonia    hx of as a child   . PONV (postoperative nausea and vomiting)   . PVC (premature ventricular contraction)   . Thyroid goiter     Past Surgical History:  Procedure Laterality Date  . LAPAROSCOPIC GASTRIC BANDING  05-23-09   dr Abran Cantor with hiatal hernia repair.   Marland Kitchen LAPAROSCOPIC RIGHT HEMI COLECTOMY Right 03/25/2015   Procedure: LAPAROSCOPIC ASSISTED RIGHT HEMI COLECTOMY, with removal of lap band fluid prior to prepping.;  Surgeon: Alphonsa Overall, MD;   Location: [facility];  Service: General;  Laterality: Right;  . LIPOSUCTION  1995    Family History  Problem Relation Age of Onset  . Hypertension Mother   . Aneurysm Mother 69    brain  . Hypertension Father   . Hypertension Maternal Grandmother   . Hypertension Paternal Grandmother   . Heart disease Paternal Grandmother 70    MI  . Diabetes Paternal Aunt   . Heart disease Maternal Grandfather     MI  . Alzheimer's disease Paternal Grandfather   . Colon cancer Neg Hx   . Esophageal cancer Neg Hx   . Rectal cancer Neg Hx   . Stomach cancer Neg Hx     Social History   Social History  . Marital status: Single    Spouse name: N/A  . Number of children: N/A  . Years of education: N/A   Occupational History  . [employer]-- lab assistant    Social History Main Topics  . Smoking status: Never Smoker  . Smokeless tobacco: Never Used  . Alcohol use 1.8 oz/week    3 Glasses of wine per week  . Drug use: No  . Sexual activity: No   Other Topics Concern  . Not on file   Social History Narrative  . No narrative on file    Outpatient Medications Prior to Visit  Medication Sig Dispense Refill  . traMADol (  ULTRAM) 50 MG tablet Take 1-2 tablets (50-100 mg total) by mouth daily. 30 tablet 2  . valACYclovir (VALTREX) 1000 MG tablet Take 500 mg by mouth daily.     Marland Kitchen ALPRAZolam (XANAX) 0.25 MG tablet Take 1 tablet (0.25 mg total) by mouth 3 (three) times daily as needed for anxiety. 60 tablet 2  . furosemide (LASIX) 20 MG tablet Take 1 tablet (20 mg total) by mouth daily. 90 tablet 3  . levothyroxine (SYNTHROID, LEVOTHROID) 50 MCG tablet Take 50 mcg by mouth daily before breakfast.    . Cholecalciferol (VITAMIN D) 2000 UNITS CAPS Take by mouth daily.    Marland Kitchen ibuprofen (ADVIL,MOTRIN) 200 MG tablet You can take 2-3 tablets every 6 hours as needed for pain. (Patient not taking: Reported on 05/04/2016) 30 tablet 0  . naproxen (NAPROSYN) 500 MG tablet Take 1 tablet by mouth as needed.  0  .  Lorcaserin HCl (BELVIQ) 10 MG TABS 1 po bid (Patient not taking: Reported on 05/04/2016) 60 tablet 2   No facility-administered medications prior to visit.     No Known Allergies  Review of Systems  Constitutional: Negative for fever and malaise/fatigue.  HENT: Negative for congestion.   Eyes: Negative for blurred vision.  Respiratory: Negative for cough and shortness of breath.   Cardiovascular: Negative for chest pain, palpitations and leg swelling.  Gastrointestinal: Negative for abdominal pain, blood in stool, nausea and vomiting.  Genitourinary: Negative for dysuria and frequency.  Musculoskeletal: Negative for back pain and falls.  Skin: Negative for rash.  Neurological: Negative for dizziness, loss of consciousness and headaches.  Endo/Heme/Allergies: Negative for environmental allergies.  Psychiatric/Behavioral: Negative for depression. The patient is not nervous/anxious.        Objective:    Physical Exam  Constitutional: She is oriented to person, place, and time. She appears well-developed and well-nourished. No distress.  HENT:  Head: Normocephalic and atraumatic.  Right Ear: External ear normal.  Left Ear: External ear normal.  Nose: Nose normal.  Mouth/Throat: Oropharynx is clear and moist.  Eyes: Conjunctivae and EOM are normal. Pupils are equal, round, and reactive to light.  Neck: Normal range of motion. Neck supple. No thyromegaly present.  Cardiovascular: Normal rate, regular rhythm and normal heart sounds.   No murmur heard. Pulmonary/Chest: Effort normal and breath sounds normal. No respiratory distress. She has no wheezes. She has no rales. She exhibits no tenderness.  Abdominal: Soft. Bowel sounds are normal. There is no tenderness.  Musculoskeletal: Normal range of motion. She exhibits no edema or deformity.  Neurological: She is alert and oriented to person, place, and time.  Skin: Skin is warm and dry. She is not diaphoretic.  Psychiatric: She has a  normal mood and affect. Her behavior is normal. Judgment and thought content normal.  Nursing note and vitals reviewed.   BP 130/70 (BP Location: Left Arm, Patient Position: Sitting, Cuff Size: Large)   Pulse 80   Temp 97.9 F (36.6 C) (Oral)   Resp 16   Ht 5\' 4"  (1.626 m)   Wt 243 lb 12.8 oz (110.6 kg)   SpO2 98%   BMI 41.85 kg/m  Wt Readings from Last 3 Encounters:  05/04/16 243 lb 12.8 oz (110.6 kg)  03/09/16 230 lb 3.2 oz (104.4 kg)  01/06/16 238 lb (108 kg)     Lab Results  Component Value Date   WBC 14.8 (H) 03/26/2015   HGB 12.2 03/26/2015   HCT 37.1 03/26/2015   PLT 391 03/26/2015  GLUCOSE 156 (H) 03/26/2015   CHOL 164 01/10/2015   TRIG 92.0 01/10/2015   HDL 62.00 01/10/2015   LDLCALC 83 01/10/2015   ALT 11 (L) 03/25/2015   AST 16 03/25/2015   NA 137 03/26/2015   K 4.3 03/26/2015   CL 106 03/26/2015   CREATININE 0.84 03/26/2015   BUN 6 03/26/2015   CO2 23 03/26/2015   TSH 2.92 01/10/2015   HGBA1C 5.2 03/25/2015   MICROALBUR <0.7 01/10/2015    Lab Results  Component Value Date   TSH 2.92 01/10/2015   Lab Results  Component Value Date   WBC 14.8 (H) 03/26/2015   HGB 12.2 03/26/2015   HCT 37.1 03/26/2015   MCV 92.3 03/26/2015   PLT 391 03/26/2015   Lab Results  Component Value Date   NA 137 03/26/2015   K 4.3 03/26/2015   CO2 23 03/26/2015   GLUCOSE 156 (H) 03/26/2015   BUN 6 03/26/2015   CREATININE 0.84 03/26/2015   BILITOT 1.3 (H) 03/25/2015   ALKPHOS 93 03/25/2015   AST 16 03/25/2015   ALT 11 (L) 03/25/2015   PROT 8.5 (H) 03/25/2015   ALBUMIN 3.9 03/25/2015   CALCIUM 8.2 (L) 03/26/2015   ANIONGAP 8 03/26/2015   GFR 89.75 01/10/2015   Lab Results  Component Value Date   CHOL 164 01/10/2015   Lab Results  Component Value Date   HDL 62.00 01/10/2015   Lab Results  Component Value Date   LDLCALC 83 01/10/2015   Lab Results  Component Value Date   TRIG 92.0 01/10/2015   Lab Results  Component Value Date   CHOLHDL 3  01/10/2015   Lab Results  Component Value Date   HGBA1C 5.2 03/25/2015       Assessment & Plan:   Problem List Items Addressed This Visit      Unprioritized   EDEMA   Relevant Medications   furosemide (LASIX) 20 MG tablet   Anxiety state   Relevant Medications   ALPRAZolam (XANAX) 0.25 MG tablet   escitalopram (LEXAPRO) 10 MG tablet   Palpitations   Relevant Medications   ALPRAZolam (XANAX) 0.25 MG tablet   Preventative health care - Primary   Relevant Orders   Hemoglobin A1c   Essential hypertension   Relevant Medications   furosemide (LASIX) 20 MG tablet   Other Relevant Orders   CBC   Comprehensive metabolic panel   TSH    Other Visit Diagnoses    Hyperlipidemia, unspecified hyperlipidemia type       Relevant Medications   furosemide (LASIX) 20 MG tablet   Other Relevant Orders   Lipid panel   Tachycardia       Relevant Medications   ALPRAZolam (XANAX) 0.25 MG tablet   Hypothyroidism, unspecified type       Relevant Medications   levothyroxine (SYNTHROID, LEVOTHROID) 50 MCG tablet   Morbid obesity (HCC)       Relevant Medications   Liraglutide -Weight Management (SAXENDA) 18 MG/3ML SOPN      I have discontinued Ms. Naser's Lorcaserin HCl. I have also changed her escitalopram and levothyroxine. Additionally, I am having her start on Liraglutide -Weight Management. Lastly, I am having her maintain her valACYclovir, Vitamin D, ibuprofen, naproxen, traMADol, zolpidem, ALPRAZolam, and furosemide.  Meds ordered this encounter  Medications  . DISCONTD: escitalopram (LEXAPRO) 10 MG tablet    Sig: Take 15 mg by mouth once.    Refill:  0  . zolpidem (AMBIEN) 10 MG tablet  Sig: Take 10 mg by mouth at bedtime.    Refill:  0  . Liraglutide -Weight Management (SAXENDA) 18 MG/3ML SOPN    Sig: Inject 3 mg into the skin 3 (three) times daily.    Dispense:  5 pen    Refill:  0  . ALPRAZolam (XANAX) 0.25 MG tablet    Sig: Take 1 tablet (0.25 mg total) by mouth 3  (three) times daily as needed for anxiety.    Dispense:  60 tablet    Refill:  2  . escitalopram (LEXAPRO) 10 MG tablet    Sig: Take 1.5 tablets (15 mg total) by mouth once.    Dispense:  30 tablet    Refill:  0  . furosemide (LASIX) 20 MG tablet    Sig: Take 1 tablet (20 mg total) by mouth daily.    Dispense:  90 tablet    Refill:  3  . levothyroxine (SYNTHROID, LEVOTHROID) 50 MCG tablet    Sig: Take 1 tablet (50 mcg total) by mouth daily before breakfast.    Dispense:  30 tablet    Refill:  0    CMA served as scribe during this visit. History, Physical and Plan performed by medical provider. Documentation and orders reviewed and attested to.  Ann Held, DO Patient ID: Kelly [name], female   DOB: 1965/11/03, 51 y.o.   MRN: [id number]

## 2016-05-05 MED ORDER — ALPRAZOLAM 0.25 MG PO TABS: 0.2500 mg | ORAL_TABLET | Freq: Three times a day (TID) | ORAL | 2 refills | Status: DC | PRN

## 2016-05-05 MED ORDER — FUROSEMIDE 20 MG PO TABS: 20.0000 mg | ORAL_TABLET | Freq: Every day | ORAL | 3 refills | Status: DC

## 2016-05-05 MED ORDER — LEVOTHYROXINE SODIUM 50 MCG PO TABS: 50.0000 ug | ORAL_TABLET | Freq: Every day | ORAL | 0 refills | Status: DC

## 2016-05-05 MED ORDER — ESCITALOPRAM OXALATE 10 MG PO TABS: 15.0000 mg | ORAL_TABLET | Freq: Once | ORAL | 0 refills | Status: DC

## 2016-05-08 ENCOUNTER — Other Ambulatory Visit: Payer: BLUE CROSS/BLUE SHIELD

## 2016-05-11 ENCOUNTER — Other Ambulatory Visit (INDEPENDENT_AMBULATORY_CARE_PROVIDER_SITE_OTHER): Payer: BLUE CROSS/BLUE SHIELD

## 2016-05-11 DIAGNOSIS — I10 Essential (primary) hypertension: Secondary | ICD-10-CM

## 2016-05-11 DIAGNOSIS — E785 Hyperlipidemia, unspecified: Secondary | ICD-10-CM | POA: Diagnosis not present

## 2016-05-11 DIAGNOSIS — Z0000 Encounter for general adult medical examination without abnormal findings: Secondary | ICD-10-CM | POA: Diagnosis not present

## 2016-05-11 LAB — CBC
HCT: 39.1 % (ref 36.0–46.0)
Hemoglobin: 13.2 g/dL (ref 12.0–15.0)
MCHC: 33.7 g/dL (ref 30.0–36.0)
MCV: 89.8 fl (ref 78.0–100.0)
Platelets: 302 10*3/uL (ref 150.0–400.0)
RBC: 4.35 Mil/uL (ref 3.87–5.11)
RDW: 13 % (ref 11.5–15.5)
WBC: 6 10*3/uL (ref 4.0–10.5)

## 2016-05-11 LAB — COMPREHENSIVE METABOLIC PANEL
ALT: 9 U/L (ref 0–35)
AST: 15 U/L (ref 0–37)
Albumin: 3.7 g/dL (ref 3.5–5.2)
Alkaline Phosphatase: 83 U/L (ref 39–117)
BILIRUBIN TOTAL: 0.6 mg/dL (ref 0.2–1.2)
BUN: 13 mg/dL (ref 6–23)
CO2: 30 meq/L (ref 19–32)
CREATININE: 0.93 mg/dL (ref 0.40–1.20)
Calcium: 9.3 mg/dL (ref 8.4–10.5)
Chloride: 104 mEq/L (ref 96–112)
GFR: 81.56 mL/min (ref >60.00)
GLUCOSE: 85 mg/dL (ref 70–99)
Potassium: 3.6 mEq/L (ref 3.5–5.1)
Sodium: 138 mEq/L (ref 135–145)
Total Protein: 7.4 g/dL (ref 6.0–8.3)

## 2016-05-11 LAB — LIPID PANEL
CHOL/HDL RATIO: 3
Cholesterol: 194 mg/dL (ref 0–200)
HDL: 68.2 mg/dL (ref >39.00)
LDL Cholesterol: 115 mg/dL — ABNORMAL HIGH (ref 0–99)
NONHDL: 126.08
Triglycerides: 55 mg/dL (ref 0.0–149.0)
VLDL: 11 mg/dL (ref 0.0–40.0)

## 2016-05-11 LAB — TSH: TSH: 2.22 u[IU]/mL (ref 0.35–4.50)

## 2016-05-11 LAB — HEMOGLOBIN A1C: Hgb A1c MFr Bld: 5.2 % (ref 4.6–6.5)

## 2016-05-14 ENCOUNTER — Other Ambulatory Visit: Payer: Self-pay | Admitting: Family Medicine

## 2016-05-14 ENCOUNTER — Telehealth: Payer: Self-pay | Admitting: Family Medicine

## 2016-05-14 DIAGNOSIS — R002 Palpitations: Secondary | ICD-10-CM

## 2016-05-14 DIAGNOSIS — R000 Tachycardia, unspecified: Secondary | ICD-10-CM

## 2016-05-14 DIAGNOSIS — R739 Hyperglycemia, unspecified: Secondary | ICD-10-CM

## 2016-05-14 MED ORDER — ALPRAZOLAM 0.25 MG PO TABS: 0.2500 mg | ORAL_TABLET | Freq: Three times a day (TID) | ORAL | 2 refills | Status: DC | PRN

## 2016-05-14 NOTE — Telephone Encounter (Signed)
Printed and on counter for signature Once signed will fax to  [facility] precision Patient informed hardcopy will be faxed to pharmacy on 05/15/2016

## 2016-05-14 NOTE — Telephone Encounter (Signed)
Requesting:  Alazolam Contract  None UDS  None Last OV   05/04/2016--next scheduled 07/27/2016 Last Refill  05/04/16  #60 with 2 refills (PATIENT STATES SHE NEVER RECEIVED)   Please Advise- LOOK like it was printed, but no documentation on being faxed.

## 2016-05-14 NOTE — Telephone Encounter (Signed)
Ok to refill 

## 2016-05-21 ENCOUNTER — Telehealth: Payer: Self-pay

## 2016-05-21 NOTE — Telephone Encounter (Signed)
PA initiated via Covermymeds; [id number]. Awaiting determination.

## 2016-05-22 NOTE — Telephone Encounter (Signed)
Patient notified sample ready for pickup and that we were still working on [place]

## 2016-05-22 NOTE — Telephone Encounter (Signed)
Patient called to check the status of this PA she states she would like another sample of the medication as she is out. She would like a voicemail left for her when the sample is ready. Please advise

## 2016-05-22 NOTE — Telephone Encounter (Signed)
Received PA denial, Pt must try and fail both Belviq and Qsymia. Pt has only tried and failed Belviq. Please advise.

## 2016-05-22 NOTE — Telephone Encounter (Signed)
Pt unable to take qysymia due to htn

## 2016-05-23 NOTE — Telephone Encounter (Signed)
Appeal initiated via Covermymeds. Awaiting determination.

## 2016-05-31 NOTE — Telephone Encounter (Signed)
Kelly [name] was approved 05/21/16-09/23/16.  Called pharmacy and they said it went through for $30.  Patient notified.

## 2016-07-04 ENCOUNTER — Telehealth: Payer: Self-pay | Admitting: Family Medicine

## 2016-07-04 NOTE — Telephone Encounter (Signed)
Caller name: Relationship to patient: Self Can be reached: [phone]  Pharmacy:  [facility], [place] [phone] (Phone) [phone] (Fax)     Reason for call: Refill Liraglutide -Weight Management ([place]) 18 MG/3ML SOPN [[id number]]

## 2016-07-05 MED ORDER — LIRAGLUTIDE -WEIGHT MANAGEMENT 18 MG/3ML ~~LOC~~ SOPN: 3.0000 mg | PEN_INJECTOR | Freq: Three times a day (TID) | SUBCUTANEOUS | 0 refills | Status: DC

## 2016-07-05 NOTE — Telephone Encounter (Signed)
Refilled Rx Saxenda, per patient. LB

## 2016-07-18 ENCOUNTER — Other Ambulatory Visit: Payer: Self-pay | Admitting: *Deleted

## 2016-07-18 MED ORDER — LIRAGLUTIDE -WEIGHT MANAGEMENT 18 MG/3ML ~~LOC~~ SOPN: 3.0000 mg | PEN_INJECTOR | Freq: Three times a day (TID) | SUBCUTANEOUS | 0 refills | Status: DC

## 2016-07-18 NOTE — Telephone Encounter (Signed)
Patient stated that she called for refill.  It looks like we refilled but it was set to print.  Rx resent to pharmacy and patient notified.

## 2016-07-27 ENCOUNTER — Telehealth: Payer: Self-pay | Admitting: Family Medicine

## 2016-07-27 ENCOUNTER — Ambulatory Visit (INDEPENDENT_AMBULATORY_CARE_PROVIDER_SITE_OTHER): Payer: BLUE CROSS/BLUE SHIELD | Admitting: Family Medicine

## 2016-07-27 ENCOUNTER — Encounter: Payer: Self-pay | Admitting: Family Medicine

## 2016-07-27 DIAGNOSIS — R739 Hyperglycemia, unspecified: Secondary | ICD-10-CM | POA: Diagnosis not present

## 2016-07-27 DIAGNOSIS — M1990 Unspecified osteoarthritis, unspecified site: Secondary | ICD-10-CM | POA: Diagnosis not present

## 2016-07-27 MED ORDER — LIRAGLUTIDE -WEIGHT MANAGEMENT 18 MG/3ML ~~LOC~~ SOPN: 3.0000 mg | PEN_INJECTOR | Freq: Every day | SUBCUTANEOUS | 0 refills | Status: DC

## 2016-07-27 MED ORDER — LIRAGLUTIDE -WEIGHT MANAGEMENT 18 MG/3ML ~~LOC~~ SOPN: 3.0000 mg | PEN_INJECTOR | Freq: Every day | SUBCUTANEOUS | 3 refills | Status: DC

## 2016-07-27 MED ORDER — ONDANSETRON HCL 4 MG PO TABS: 4.0000 mg | ORAL_TABLET | Freq: Three times a day (TID) | ORAL | 0 refills | Status: DC | PRN

## 2016-07-27 MED ORDER — TRAMADOL HCL 50 MG PO TABS: 50.0000 mg | ORAL_TABLET | Freq: Every day | ORAL | 2 refills | Status: DC

## 2016-07-27 NOTE — Telephone Encounter (Signed)
°  Relation to HY:HOOI Call back number: [phone] Pharmacy: [facility], [place]   Reason for call:  Patient requesting Rx for [place] and vomiting, please advise

## 2016-07-27 NOTE — Patient Instructions (Signed)

## 2016-07-27 NOTE — Telephone Encounter (Signed)
Sent in [name] the patient to inform prescription sent in

## 2016-07-27 NOTE — Progress Notes (Signed)
Patient ID: Kelly [name], female   DOB: July 02, 1965, 51 y.o.   MRN: [id number]   Subjective:  I acted as a Education administrator for [employer], DO. Raiford Noble, [place]   Patient ID: Kelly [name], female    DOB: 01-31-1966, 51 y.o.   MRN: [id number]  Chief Complaint  Patient presents with  . Medication F/U    Nausea, Headaches. Dry heaving this morning.    HPI  Patient is in today for a medication follow up (Saxenda). Patient states that she been suffering from nausea, headaches. Also states that she was dry heaving this morning. Patient has a Hx of HTN, arthritis, depression, cellulitis and abscess of buttock. Patient has no addition concerns noted at this time.  Patient Care Team: Ann Held, DO as PCP - General Excell Seltzer, MD as Consulting Physician (General Surgery) Milus Banister, MD as Attending Physician (Gastroenterology)   Past Medical History:  Diagnosis Date  . Allergic rhinitis   . Anxiety   . Arthritis    feet, rt knee  . Depression with anxiety   . Heart murmur   . History of hiatal hernia    repaur 2011   . Hypertension   . Hypothyroidism   . 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 Morbid obesity ([place]) 2009  . PAC (premature atrial contraction)   . Pedunculated colonic polyp 03/04/15   tubulovillous adenoma of transverse colon.   . Pneumonia    hx of as a child   . PONV (postoperative nausea and vomiting)   . PVC (premature ventricular contraction)   . Thyroid goiter     Past Surgical History:  Procedure Laterality Date  . LAPAROSCOPIC GASTRIC BANDING  05-23-09   dr Abran Cantor with hiatal hernia repair.   Marland Kitchen LAPAROSCOPIC RIGHT HEMI COLECTOMY Right 03/25/2015   Procedure: LAPAROSCOPIC ASSISTED RIGHT HEMI COLECTOMY, with removal of lap band fluid prior to prepping.;  Surgeon: Alphonsa Overall, MD;  Location: [facility];  Service: General;  Laterality: Right;  . LIPOSUCTION  1995    Family History  Problem Relation Age of Onset  .  Hypertension Mother   . Aneurysm Mother 19    brain  . Hypertension Father   . Hypertension Maternal Grandmother   . Hypertension Paternal Grandmother   . Heart disease Paternal Grandmother 22    MI  . Diabetes Paternal Aunt   . Heart disease Maternal Grandfather     MI  . Alzheimer's disease Paternal Grandfather   . Colon cancer Neg Hx   . Esophageal cancer Neg Hx   . Rectal cancer Neg Hx   . Stomach cancer Neg Hx     Social History   Social History  . Marital status: Single    Spouse name: N/A  . Number of children: N/A  . Years of education: N/A   Occupational History  . [employer]-- lab assistant    Social History Main Topics  . Smoking status: Never Smoker  . Smokeless tobacco: Never Used  . Alcohol use 1.8 oz/week    3 Glasses of wine per week  . Drug use: No  . Sexual activity: No   Other Topics Concern  . Not on file   Social History Narrative  . No narrative on file    Outpatient Medications Prior to Visit  Medication Sig Dispense Refill  . ALPRAZolam (XANAX) 0.25 MG tablet Take 1 tablet (0.25 mg total) by mouth 3 (three) times daily as needed for  anxiety. 60 tablet 2  . Cholecalciferol (VITAMIN D) 2000 UNITS CAPS Take by mouth daily.    . furosemide (LASIX) 20 MG tablet Take 1 tablet (20 mg total) by mouth daily. 90 tablet 3  . ibuprofen (ADVIL,MOTRIN) 200 MG tablet You can take 2-3 tablets every 6 hours as needed for pain. 30 tablet 0  . levothyroxine (SYNTHROID, LEVOTHROID) 50 MCG tablet Take 1 tablet (50 mcg total) by mouth daily before breakfast. 30 tablet 0  . valACYclovir (VALTREX) 1000 MG tablet Take 500 mg by mouth daily.     Marland Kitchen zolpidem (AMBIEN) 10 MG tablet Take 10 mg by mouth at bedtime.  0  . Liraglutide -Weight Management (SAXENDA) 18 MG/3ML SOPN Inject 3 mg into the skin 3 (three) times daily. 5 pen 0  . traMADol (ULTRAM) 50 MG tablet Take 1-2 tablets (50-100 mg total) by mouth daily. 30 tablet 2  . escitalopram (LEXAPRO) 10 MG tablet  Take 1.5 tablets (15 mg total) by mouth once. 30 tablet 0  . naproxen (NAPROSYN) 500 MG tablet Take 1 tablet by mouth as needed.  0   No facility-administered medications prior to visit.     No Known Allergies  Review of Systems  Constitutional: Negative for fever and malaise/fatigue.  HENT: Negative for congestion.   Eyes: Negative for blurred vision.  Respiratory: Negative for cough and shortness of breath.   Cardiovascular: Negative for chest pain, palpitations and leg swelling.  Gastrointestinal: Positive for nausea. Negative for vomiting.       Dry Heave.  Musculoskeletal: Negative for back pain.  Skin: Negative for rash.  Neurological: Positive for headaches. Negative for loss of consciousness.       Objective:    Physical Exam  Constitutional: She is oriented to person, place, and time. She appears well-developed and well-nourished. No distress.  HENT:  Head: Normocephalic and atraumatic.  Eyes: Conjunctivae are normal.  Neck: Normal range of motion. No thyromegaly present.  Cardiovascular: Normal rate and regular rhythm.   Pulmonary/Chest: Effort normal and breath sounds normal. She has no wheezes.  Abdominal: Soft. Bowel sounds are normal. There is no tenderness.  Musculoskeletal: She exhibits no edema or deformity.  Neurological: She is alert and oriented to person, place, and time.  Skin: Skin is warm and dry. She is not diaphoretic.  Psychiatric: She has a normal mood and affect.    BP (!) 127/54 (BP Location: Left Arm, Patient Position: Sitting, Cuff Size: Large)   Pulse 100   Temp 98.4 F (36.9 C) (Oral)   Wt 224 lb 12.8 oz (102 kg)   SpO2 100% Comment: RA  BMI 38.59 kg/m  Wt Readings from Last 3 Encounters:  07/27/16 224 lb 12.8 oz (102 kg)  05/04/16 243 lb 12.8 oz (110.6 kg)  03/09/16 230 lb 3.2 oz (104.4 kg)   BP Readings from Last 3 Encounters:  07/27/16 (!) 127/54  05/04/16 130/70  03/09/16 130/78     Immunization History  Administered  Date(s) Administered  . H1N1 02/11/2008  . Influenza Whole 01/07/2008, 01/05/2009, 01/25/2011  . Influenza,inj,Quad PF,36+ Mos 12/24/2012  . Influenza-Unspecified 11/24/2013, 12/31/2014, 03/05/2016  . PPD Test 10/08/2011  . Td 03/26/1996, 08/09/2006  . Varicella 10/09/2007, 09/13/2009    Health Maintenance  Topic Date Due  . MAMMOGRAM  12/10/2015  . HIV Screening  10/13/2016 (Originally 10/22/1979)  . TETANUS/TDAP  08/08/2016  . INFLUENZA VACCINE  10/24/2016  . PAP SMEAR  12/09/2017  . COLONOSCOPY  03/03/2018    Lab Results  Component Value Date   WBC 6.0 05/11/2016   HGB 13.2 05/11/2016   HCT 39.1 05/11/2016   PLT 302.0 05/11/2016   GLUCOSE 85 05/11/2016   CHOL 194 05/11/2016   TRIG 55.0 05/11/2016   HDL 68.20 05/11/2016   LDLCALC 115 (H) 05/11/2016   ALT 9 05/11/2016   AST 15 05/11/2016   NA 138 05/11/2016   K 3.6 05/11/2016   CL 104 05/11/2016   CREATININE 0.93 05/11/2016   BUN 13 05/11/2016   CO2 30 05/11/2016   TSH 2.22 05/11/2016   HGBA1C 5.2 05/11/2016   MICROALBUR <0.7 01/10/2015    Lab Results  Component Value Date   TSH 2.22 05/11/2016   Lab Results  Component Value Date   WBC 6.0 05/11/2016   HGB 13.2 05/11/2016   HCT 39.1 05/11/2016   MCV 89.8 05/11/2016   PLT 302.0 05/11/2016   Lab Results  Component Value Date   NA 138 05/11/2016   K 3.6 05/11/2016   CO2 30 05/11/2016   GLUCOSE 85 05/11/2016   BUN 13 05/11/2016   CREATININE 0.93 05/11/2016   BILITOT 0.6 05/11/2016   ALKPHOS 83 05/11/2016   AST 15 05/11/2016   ALT 9 05/11/2016   PROT 7.4 05/11/2016   ALBUMIN 3.7 05/11/2016   CALCIUM 9.3 05/11/2016   ANIONGAP 8 03/26/2015   GFR 81.56 05/11/2016   Lab Results  Component Value Date   CHOL 194 05/11/2016   Lab Results  Component Value Date   HDL 68.20 05/11/2016   Lab Results  Component Value Date   LDLCALC 115 (H) 05/11/2016   Lab Results  Component Value Date   TRIG 55.0 05/11/2016   Lab Results  Component Value Date     CHOLHDL 3 05/11/2016   Lab Results  Component Value Date   HGBA1C 5.2 05/11/2016         Assessment & Plan:   Problem List Items Addressed This Visit      Unprioritized   Obesity, Class III, BMI 40-49.9 (morbid obesity) ([place])    con't saxenda Encouraged exercise rto 3 months or sooner prn       Relevant Medications   Liraglutide -Weight Management (SAXENDA) 18 MG/3ML SOPN    Other Visit Diagnoses    Morbid obesity ([place])    -  Primary   Relevant Medications   Liraglutide -Weight Management (SAXENDA) 18 MG/3ML SOPN   Arthritis       Relevant Medications   traMADol (ULTRAM) 50 MG tablet   Hyperglycemia          I have discontinued Ms. Deacon's naproxen, escitalopram, Naproxen-Esomeprazole (VIMOVO PO), and VIMOVO. I am also having her maintain her valACYclovir, Vitamin D, ibuprofen, zolpidem, furosemide, levothyroxine, ALPRAZolam, Liraglutide -Weight Management, and traMADol.  Meds ordered this encounter  Medications  . DISCONTD: Naproxen-Esomeprazole (VIMOVO PO)    Sig: Take by mouth.  . DISCONTD: VIMOVO 500-20 MG TBEC    Sig: Take 1 tablet by mouth 2 (two) times daily.    Refill:  6  . DISCONTD: Liraglutide -Weight Management (SAXENDA) 18 MG/3ML SOPN    Sig: Inject 3 mg into the skin daily.    Dispense:  5 pen    Refill:  0  . Liraglutide -Weight Management (SAXENDA) 18 MG/3ML SOPN    Sig: Inject 3 mg into the skin daily.    Dispense:  5 pen    Refill:  3  . traMADol (ULTRAM) 50 MG tablet    Sig: Take 1-2 tablets (  50-100 mg total) by mouth daily.    Dispense:  30 tablet    Refill:  2     Ann Held, DO

## 2016-07-27 NOTE — Assessment & Plan Note (Signed)
con't saxenda Encouraged exercise rto 3 months or sooner prn

## 2016-07-27 NOTE — Telephone Encounter (Signed)
zofran 4 mg 1 po tid prn #20

## 2016-07-27 NOTE — Addendum Note (Signed)
Addended by: Caffie Pinto on: 07/27/2016 03:05 PM   Modules accepted: Orders

## 2016-07-27 NOTE — Progress Notes (Signed)
Pre visit review using our clinic review tool, if applicable. No additional management support is needed unless otherwise documented below in the visit note. 

## 2016-07-28 LAB — BASIC METABOLIC PANEL
BUN: 6 mg/dL — AB (ref 7–25)
CHLORIDE: 102 mmol/L (ref 98–110)
CO2: 25 mmol/L (ref 20–31)
CREATININE: 1.07 mg/dL — AB (ref 0.50–1.05)
Calcium: 10.3 mg/dL (ref 8.6–10.4)
GLUCOSE: 83 mg/dL (ref 65–99)
Potassium: 5.3 mmol/L (ref 3.5–5.3)
Sodium: 139 mmol/L (ref 135–146)

## 2016-07-28 LAB — HEMOGLOBIN A1C
Hgb A1c MFr Bld: 4.9 % (ref <5.7)
MEAN PLASMA GLUCOSE: 94 mg/dL

## 2016-08-14 ENCOUNTER — Other Ambulatory Visit: Payer: BLUE CROSS/BLUE SHIELD

## 2016-10-12 ENCOUNTER — Encounter: Payer: Self-pay | Admitting: Family Medicine

## 2016-10-12 ENCOUNTER — Ambulatory Visit: Payer: BLUE CROSS/BLUE SHIELD | Admitting: Family Medicine

## 2016-10-12 ENCOUNTER — Ambulatory Visit (INDEPENDENT_AMBULATORY_CARE_PROVIDER_SITE_OTHER): Payer: BLUE CROSS/BLUE SHIELD | Admitting: Family Medicine

## 2016-10-12 VITALS — BP 118/70 | HR 78 | Temp 98.4°F | Resp 16 | Ht 64.0 in | Wt 230.6 lb

## 2016-10-12 DIAGNOSIS — R002 Palpitations: Secondary | ICD-10-CM | POA: Diagnosis not present

## 2016-10-12 MED ORDER — METOPROLOL SUCCINATE ER 25 MG PO TB24: ORAL_TABLET | ORAL | 3 refills | Status: DC

## 2016-10-12 NOTE — Assessment & Plan Note (Signed)
Refill saxenda con't diet and exercise Refer to healthy weight and wellness program

## 2016-10-12 NOTE — Progress Notes (Signed)
Patient ID: Kelly [name], female    DOB: Dec 07, 1965  Age: 51 y.o. MRN: [id number]    Subjective:  Subjective  HPI Kelly [name] presents for f/u weight loss and palpitations.  Palpitations started coming back so pt started back on metoprolol daily and she feels better Pt is exercising and taking saxenda ---- but is struggling with diet   Review of Systems  Constitutional: Negative for activity change, appetite change, fatigue and unexpected weight change.  Respiratory: Negative for cough and shortness of breath.   Cardiovascular: Negative for chest pain and palpitations.  Psychiatric/Behavioral: Negative for behavioral problems and dysphoric mood. The patient is not nervous/anxious.     History Past Medical History:  Diagnosis Date  . Allergic rhinitis   . Anxiety   . Arthritis    feet, rt knee  . Depression with anxiety   . Heart murmur   . History of hiatal hernia    repaur 2011   . Hypertension   . Hypothyroidism   . 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 Morbid obesity ([place]) 2009  . PAC (premature atrial contraction)   . Pedunculated colonic polyp 03/04/15   tubulovillous adenoma of transverse colon.   . Pneumonia    hx of as a child   . PONV (postoperative nausea and vomiting)   . PVC (premature ventricular contraction)   . Thyroid goiter     She has a past surgical history that includes Laparoscopic gastric banding (05-23-09); Liposuction (1995); and Laparoscopic right hemi colectomy (Right, 03/25/2015).   Her family history includes Alzheimer's disease in her paternal grandfather; Aneurysm (age of onset: 43) in her mother; Diabetes in her paternal aunt; Heart disease in her maternal grandfather; Heart disease (age of onset: 60) in her paternal grandmother; Hypertension in her father, maternal grandmother, mother, and paternal grandmother.She reports that she has never smoked. She has never used smokeless tobacco. She reports that she drinks about  1.8 oz of alcohol per week . She reports that she does not use drugs.  Current Outpatient Prescriptions on File Prior to Visit  Medication Sig Dispense Refill  . ALPRAZolam (XANAX) 0.25 MG tablet Take 1 tablet (0.25 mg total) by mouth 3 (three) times daily as needed for anxiety. 60 tablet 2  . Cholecalciferol (VITAMIN D) 2000 UNITS CAPS Take by mouth daily.    . furosemide (LASIX) 20 MG tablet Take 1 tablet (20 mg total) by mouth daily. 90 tablet 3  . ibuprofen (ADVIL,MOTRIN) 200 MG tablet You can take 2-3 tablets every 6 hours as needed for pain. 30 tablet 0  . levothyroxine (SYNTHROID, LEVOTHROID) 50 MCG tablet Take 1 tablet (50 mcg total) by mouth daily before breakfast. 30 tablet 0  . Liraglutide -Weight Management (SAXENDA) 18 MG/3ML SOPN Inject 3 mg into the skin daily. 5 pen 3  . ondansetron (ZOFRAN) 4 MG tablet Take 1 tablet (4 mg total) by mouth 3 (three) times daily as needed for nausea or vomiting. 20 tablet 0  . traMADol (ULTRAM) 50 MG tablet Take 1-2 tablets (50-100 mg total) by mouth daily. 30 tablet 2  . valACYclovir (VALTREX) 1000 MG tablet Take 500 mg by mouth daily.     Marland Kitchen zolpidem (AMBIEN) 10 MG tablet Take 10 mg by mouth at bedtime.  0   No current facility-administered medications on file prior to visit.      Objective:  Objective  Physical Exam  Constitutional: She is oriented to person, place, and time. She appears  well-developed and well-nourished.  HENT:  Head: Normocephalic and atraumatic.  Eyes: Conjunctivae and EOM are normal.  Neck: Normal range of motion. Neck supple. No JVD present. Carotid bruit is not present. No thyromegaly present.  Cardiovascular: Normal rate, regular rhythm and normal heart sounds.   No murmur heard. Pulmonary/Chest: Effort normal and breath sounds normal. No respiratory distress. She has no wheezes. She has no rales. She exhibits no tenderness.  Musculoskeletal: She exhibits no edema.  Neurological: She is alert and oriented to  person, place, and time.  Psychiatric: She has a normal mood and affect. Her behavior is normal. Judgment and thought content normal.  Nursing note and vitals reviewed.  BP 118/70 (BP Location: Right Arm, Cuff Size: Large)   Pulse 78   Temp 98.4 F (36.9 C) (Oral)   Resp 16   Ht 5\' 4"  (1.626 m)   Wt 230 lb 9.6 oz (104.6 kg)   SpO2 93%   BMI 39.58 kg/m  Wt Readings from Last 3 Encounters:  10/12/16 230 lb 9.6 oz (104.6 kg)  07/27/16 224 lb 12.8 oz (102 kg)  05/04/16 243 lb 12.8 oz (110.6 kg)     Lab Results  Component Value Date   WBC 6.0 05/11/2016   HGB 13.2 05/11/2016   HCT 39.1 05/11/2016   PLT 302.0 05/11/2016   GLUCOSE 83 07/27/2016   CHOL 194 05/11/2016   TRIG 55.0 05/11/2016   HDL 68.20 05/11/2016   LDLCALC 115 (H) 05/11/2016   ALT 9 05/11/2016   AST 15 05/11/2016   NA 139 07/27/2016   K 5.3 07/27/2016   CL 102 07/27/2016   CREATININE 1.07 (H) 07/27/2016   BUN 6 (L) 07/27/2016   CO2 25 07/27/2016   TSH 2.22 05/11/2016   HGBA1C 4.9 07/27/2016   MICROALBUR <0.7 01/10/2015    US Breast Ltd Uni Left Inc Axilla  Result Date: 02/10/2016 CLINICAL DATA:  Patient recalled from screening for left breast asymmetry. EXAM: 2D DIGITAL DIAGNOSTIC LEFT MAMMOGRAM WITH CAD AND ADJUNCT TOMO ULTRASOUND LEFT BREAST COMPARISON:  Previous exam(s). ACR Breast Density Category b: There are scattered areas of fibroglandular density. FINDINGS: Within the lateral aspect of the left breast on the CC view there is a persistent oval circumscribed low-density 6 mm mass. Mammographic images were processed with CAD. On physical exam, I palpate no discrete mass within the lateral left breast. Targeted ultrasound is performed, showing a 9 x 6 x 8 mm benign cluster of cysts within the left breast 3 o'clock position 2 cm from the nipple. Within the left breast 2 o'clock position 2 cm from the nipple there are 2 adjacent simple cysts measuring 7 and 8 mm respectively. IMPRESSION: Left breast benign  cyst and cluster of cysts. No mammographic evidence for malignancy. RECOMMENDATION: Screening mammogram in one year.(Code:SM-B-01Y) I have discussed the findings and recommendations with the patient. Results were also provided in writing at the conclusion of the visit. If applicable, a reminder letter will be sent to the patient regarding the next appointment. BI-RADS CATEGORY  2: Benign. Electronically Signed   By: Lovey Newcomer M.D.   On: 02/10/2016 14:03   Mm Diag Breast Tomo Uni Left  Result Date: 02/10/2016 CLINICAL DATA:  Patient recalled from screening for left breast asymmetry. EXAM: 2D DIGITAL DIAGNOSTIC LEFT MAMMOGRAM WITH CAD AND ADJUNCT TOMO ULTRASOUND LEFT BREAST COMPARISON:  Previous exam(s). ACR Breast Density Category b: There are scattered areas of fibroglandular density. FINDINGS: Within the lateral aspect of the left breast on  the CC view there is a persistent oval circumscribed low-density 6 mm mass. Mammographic images were processed with CAD. On physical exam, I palpate no discrete mass within the lateral left breast. Targeted ultrasound is performed, showing a 9 x 6 x 8 mm benign cluster of cysts within the left breast 3 o'clock position 2 cm from the nipple. Within the left breast 2 o'clock position 2 cm from the nipple there are 2 adjacent simple cysts measuring 7 and 8 mm respectively. IMPRESSION: Left breast benign cyst and cluster of cysts. No mammographic evidence for malignancy. RECOMMENDATION: Screening mammogram in one year.(Code:SM-B-01Y) I have discussed the findings and recommendations with the patient. Results were also provided in writing at the conclusion of the visit. If applicable, a reminder letter will be sent to the patient regarding the next appointment. BI-RADS CATEGORY  2: Benign. Electronically Signed   By: Lovey Newcomer M.D.   On: 02/10/2016 14:03     Assessment & Plan:  Plan  I am having Ms. Tencza start on metoprolol succinate. I am also having her maintain her  valACYclovir, Vitamin D, ibuprofen, zolpidem, furosemide, levothyroxine, ALPRAZolam, Liraglutide -Weight Management, traMADol, ondansetron, escitalopram, and progesterone.  Meds ordered this encounter  Medications  . escitalopram (LEXAPRO) 10 MG tablet    Refill:  3  . progesterone (PROMETRIUM) 200 MG capsule    Refill:  1  . metoprolol succinate (TOPROL-XL) 25 MG 24 hr tablet    Sig: 1/2 po qd    Dispense:  90 tablet    Refill:  3    Problem List Items Addressed This Visit      Unprioritized   Obesity, Class III, BMI 40-49.9 (morbid obesity) ([place])    Refill saxenda con't diet and exercise Refer to healthy weight and wellness program       Palpitations - Primary    Take 1/2 tab toprol  Refer to cardio if symptoms persist       Relevant Medications   metoprolol succinate (TOPROL-XL) 25 MG 24 hr tablet      Follow-up: Return in about 3 months (around 01/12/2017), or if symptoms worsen or fail to improve.  Ann Held, DO

## 2016-10-12 NOTE — Patient Instructions (Signed)

## 2016-10-12 NOTE — Assessment & Plan Note (Signed)
Take 1/2 tab toprol  Refer to cardio if symptoms persist

## 2016-10-12 NOTE — Progress Notes (Deleted)
Patient ID: Kelly [name], female   DOB: [birth year], 51 y.o.   MRN: [id number]     Subjective:  I acted as a Education administrator for Dr. Carollee Herter.  Kelly [name], Kelly [name]   Patient ID: Kelly [name], female    DOB: 1965-04-25, 51 y.o.   MRN: [id number]  Chief Complaint  Patient presents with  . Obesity  . Tachycardia    HPI  Patient is in today for follow up obesity and would like to talk about restarting metoprolol.  Her heart rate has been running high during the day and mostly during the nights.  Patient Care Team: Carollee Herter, Alferd Apa, DO as PCP - Haskell Riling, MD as Consulting Physician (General Surgery) Milus Banister, MD as Attending Physician (Gastroenterology)   Past Medical History:  Diagnosis Date  . Allergic rhinitis   . Anxiety   . Arthritis    feet, rt knee  . Depression with anxiety   . Heart murmur   . History of hiatal hernia    repaur 2011   . Hypertension   . Hypothyroidism   . Intussusception of colon ([place]) 09/32/35   ileocolic  . Morbid obesity ([place]) 2009  . PAC (premature atrial contraction)   . Pedunculated colonic polyp 03/04/15   tubulovillous adenoma of transverse colon.   . Pneumonia    hx of as a child   . PONV (postoperative nausea and vomiting)   . PVC (premature ventricular contraction)   . Thyroid goiter     Past Surgical History:  Procedure Laterality Date  . LAPAROSCOPIC GASTRIC BANDING  05-23-09   dr Abran Cantor with hiatal hernia repair.   Marland Kitchen LAPAROSCOPIC RIGHT HEMI COLECTOMY Right 03/25/2015   Procedure: LAPAROSCOPIC ASSISTED RIGHT HEMI COLECTOMY, with removal of lap band fluid prior to prepping.;  Surgeon: Alphonsa Overall, MD;  Location: [facility];  Service: General;  Laterality: Right;  . LIPOSUCTION  1995    Family History  Problem Relation Age of Onset  . Hypertension Mother   . Aneurysm Mother 32       brain  . Hypertension Father   . Hypertension Maternal Grandmother   . Hypertension Paternal Grandmother   . Heart disease  Paternal Grandmother 36       MI  . Diabetes Paternal Aunt   . Heart disease Maternal Grandfather        MI  . Alzheimer's disease Paternal Grandfather   . Colon cancer Neg Hx   . Esophageal cancer Neg Hx   . Rectal cancer Neg Hx   . Stomach cancer Neg Hx     Social History   Social History  . Marital status: Single    Spouse name: N/A  . Number of children: N/A  . Years of education: N/A   Occupational History  . [employer]-- lab assistant    Social History Main Topics  . Smoking status: Never Smoker  . Smokeless tobacco: Never Used  . Alcohol use 1.8 oz/week    3 Glasses of wine per week  . Drug use: No  . Sexual activity: No   Other Topics Concern  . Not on file   Social History Narrative  . No narrative on file    Outpatient Medications Prior to Visit  Medication Sig Dispense Refill  . ALPRAZolam (XANAX) 0.25 MG tablet Take 1 tablet (0.25 mg total) by mouth 3 (three) times daily as needed for anxiety. 60 tablet 2  . Cholecalciferol (VITAMIN D) 2000 UNITS CAPS Take by mouth  daily.    . furosemide (LASIX) 20 MG tablet Take 1 tablet (20 mg total) by mouth daily. 90 tablet 3  . ibuprofen (ADVIL,MOTRIN) 200 MG tablet You can take 2-3 tablets every 6 hours as needed for pain. 30 tablet 0  . levothyroxine (SYNTHROID, LEVOTHROID) 50 MCG tablet Take 1 tablet (50 mcg total) by mouth daily before breakfast. 30 tablet 0  . Liraglutide -Weight Management (SAXENDA) 18 MG/3ML SOPN Inject 3 mg into the skin daily. 5 pen 3  . ondansetron (ZOFRAN) 4 MG tablet Take 1 tablet (4 mg total) by mouth 3 (three) times daily as needed for nausea or vomiting. 20 tablet 0  . traMADol (ULTRAM) 50 MG tablet Take 1-2 tablets (50-100 mg total) by mouth daily. 30 tablet 2  . valACYclovir (VALTREX) 1000 MG tablet Take 500 mg by mouth daily.     Marland Kitchen zolpidem (AMBIEN) 10 MG tablet Take 10 mg by mouth at bedtime.  0   No facility-administered medications prior to visit.     No Known  Allergies  Review of Systems  Constitutional: Negative for fever and malaise/fatigue.  HENT: Negative for congestion.   Eyes: Negative for blurred vision.  Respiratory: Negative for cough and shortness of breath.   Cardiovascular: Positive for palpitations. Negative for chest pain and leg swelling.  Gastrointestinal: Negative for vomiting.  Musculoskeletal: Negative for back pain.  Skin: Negative for rash.  Neurological: Negative for loss of consciousness and headaches.       Objective:    Physical Exam  Constitutional: She is oriented to person, place, and time. She appears well-developed and well-nourished. No distress.  HENT:  Head: Normocephalic and atraumatic.  Eyes: Conjunctivae are normal.  Neck: Normal range of motion. No thyromegaly present.  Cardiovascular: Normal rate and regular rhythm.   Pulmonary/Chest: Effort normal and breath sounds normal. She has no wheezes.  Abdominal: Soft. Bowel sounds are normal. There is no tenderness.  Musculoskeletal: Normal range of motion. She exhibits no edema or deformity.  Neurological: She is alert and oriented to person, place, and time.  Skin: Skin is warm and dry. She is not diaphoretic.  Psychiatric: She has a normal mood and affect.    BP 118/70 (BP Location: Right Arm, Cuff Size: Large)   Pulse 78   Temp 98.4 F (36.9 C) (Oral)   Resp 16   Ht 5\' 4"  (1.626 m)   Wt 230 lb 9.6 oz (104.6 kg)   SpO2 93%   BMI 39.58 kg/m  Wt Readings from Last 3 Encounters:  10/12/16 230 lb 9.6 oz (104.6 kg)  07/27/16 224 lb 12.8 oz (102 kg)  05/04/16 243 lb 12.8 oz (110.6 kg)   BP Readings from Last 3 Encounters:  10/12/16 118/70  07/27/16 (!) 127/54  05/04/16 130/70     Immunization History  Administered Date(s) Administered  . H1N1 02/11/2008  . Influenza Whole 01/07/2008, 01/05/2009, 01/25/2011  . Influenza,inj,Quad PF,36+ Mos 12/24/2012  . Influenza-Unspecified 11/24/2013, 12/31/2014, 03/05/2016  . PPD Test 10/08/2011  .  Td 03/26/1996, 08/09/2006  . Varicella 10/09/2007, 09/13/2009    Health Maintenance  Topic Date Due  . MAMMOGRAM  12/10/2015  . TETANUS/TDAP  08/08/2016  . HIV Screening  10/13/2016 (Originally 10/22/1979)  . INFLUENZA VACCINE  10/24/2016  . PAP SMEAR  12/09/2017  . COLONOSCOPY  03/03/2018    Lab Results  Component Value Date   WBC 6.0 05/11/2016   HGB 13.2 05/11/2016   HCT 39.1 05/11/2016   PLT 302.0 05/11/2016  GLUCOSE 83 07/27/2016   CHOL 194 05/11/2016   TRIG 55.0 05/11/2016   HDL 68.20 05/11/2016   LDLCALC 115 (H) 05/11/2016   ALT 9 05/11/2016   AST 15 05/11/2016   NA 139 07/27/2016   K 5.3 07/27/2016   CL 102 07/27/2016   CREATININE 1.07 (H) 07/27/2016   BUN 6 (L) 07/27/2016   CO2 25 07/27/2016   TSH 2.22 05/11/2016   HGBA1C 4.9 07/27/2016   MICROALBUR <0.7 01/10/2015    Lab Results  Component Value Date   TSH 2.22 05/11/2016   Lab Results  Component Value Date   WBC 6.0 05/11/2016   HGB 13.2 05/11/2016   HCT 39.1 05/11/2016   MCV 89.8 05/11/2016   PLT 302.0 05/11/2016   Lab Results  Component Value Date   NA 139 07/27/2016   K 5.3 07/27/2016   CO2 25 07/27/2016   GLUCOSE 83 07/27/2016   BUN 6 (L) 07/27/2016   CREATININE 1.07 (H) 07/27/2016   BILITOT 0.6 05/11/2016   ALKPHOS 83 05/11/2016   AST 15 05/11/2016   ALT 9 05/11/2016   PROT 7.4 05/11/2016   ALBUMIN 3.7 05/11/2016   CALCIUM 10.3 07/27/2016   ANIONGAP 8 03/26/2015   GFR 81.56 05/11/2016   Lab Results  Component Value Date   CHOL 194 05/11/2016   Lab Results  Component Value Date   HDL 68.20 05/11/2016   Lab Results  Component Value Date   LDLCALC 115 (H) 05/11/2016   Lab Results  Component Value Date   TRIG 55.0 05/11/2016   Lab Results  Component Value Date   CHOLHDL 3 05/11/2016   Lab Results  Component Value Date   HGBA1C 4.9 07/27/2016         Assessment & Plan:   Problem List Items Addressed This Visit    None      I am having Ms. Kelly [name] maintain  her valACYclovir, Vitamin D, ibuprofen, zolpidem, furosemide, levothyroxine, ALPRAZolam, Liraglutide -Weight Management, traMADol, ondansetron, escitalopram, and progesterone.  Meds ordered this encounter  Medications  . escitalopram (LEXAPRO) 10 MG tablet    Refill:  3  . progesterone (PROMETRIUM) 200 MG capsule    Refill:  1    {PROVIDER TO DELETE} Jerene Dilling, CMA

## 2016-11-01 ENCOUNTER — Telehealth: Payer: Self-pay

## 2016-11-01 NOTE — Telephone Encounter (Signed)
PA initiated via Covermymeds; KEY: [id number]. Awaiting determination.

## 2016-11-05 NOTE — Telephone Encounter (Signed)
Effective from 11/01/2016 through 10/31/2017. continuation

## 2016-12-24 ENCOUNTER — Encounter (HOSPITAL_COMMUNITY): Payer: Self-pay

## 2017-01-06 IMAGING — MG MM DIAG BREAST TOMO UNI LEFT
8 series · 8 of 24 positions shown · non-contrast
Comparison: Previous exam(s).

CLINICAL DATA: 49-year-old female presenting for evaluation of
possible left breast masses.

EXAM:
DIGITAL DIAGNOSTIC LEFT MAMMOGRAM WITH 3D TOMOSYNTHESIS AND CAD
LEFT BREAST ULTRASOUND

[L MLO (1 of 2)]
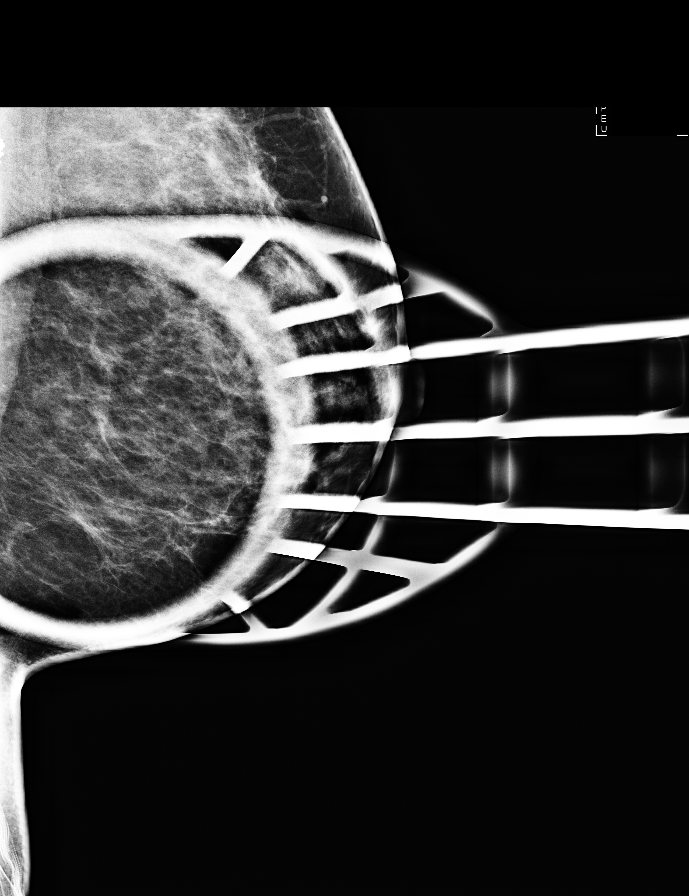

[L CC (1 of 2)]
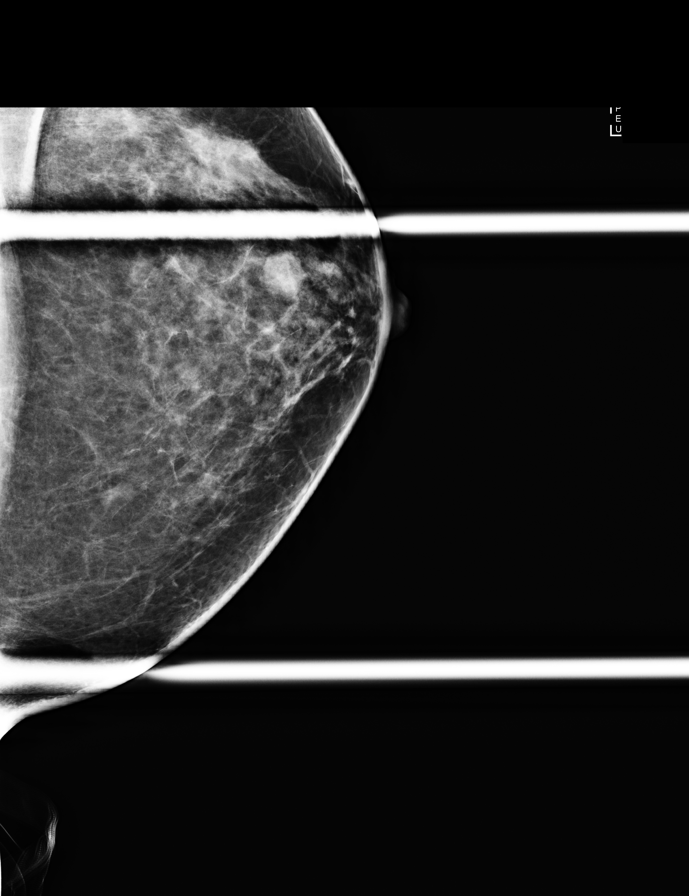

[L CC (2 of 2)]
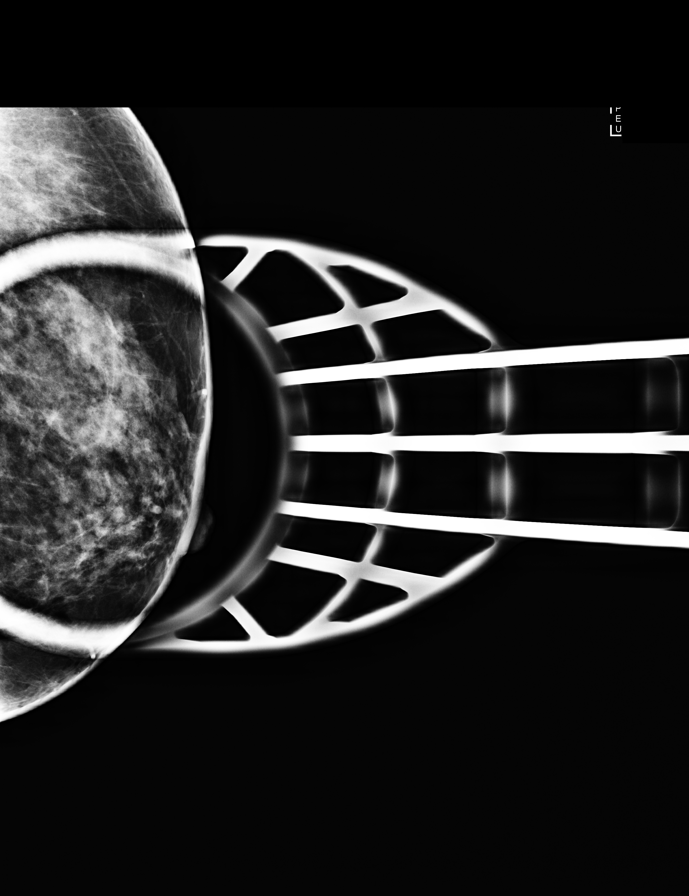

[L MLO (2 of 2)]
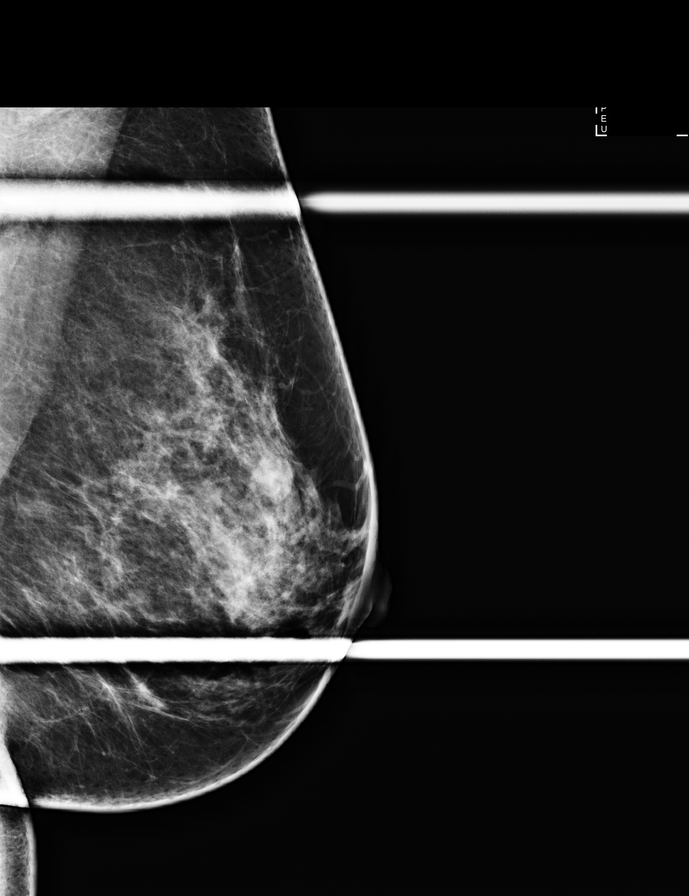

[L CC tomo (1 of 2) · tomo slice 41/80.0]
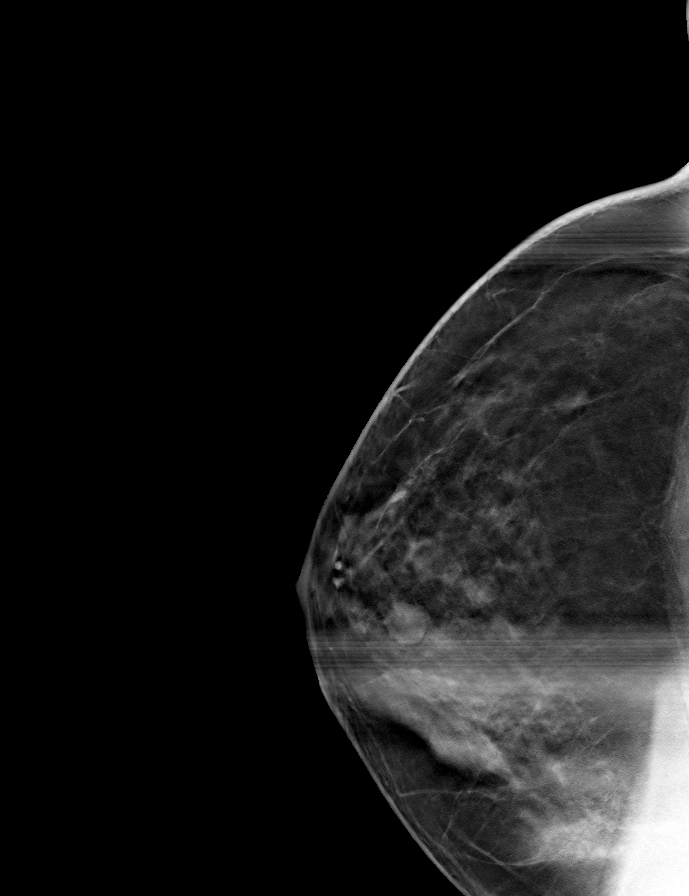

[L CC tomo (2 of 2) · tomo slice 33/64.0]
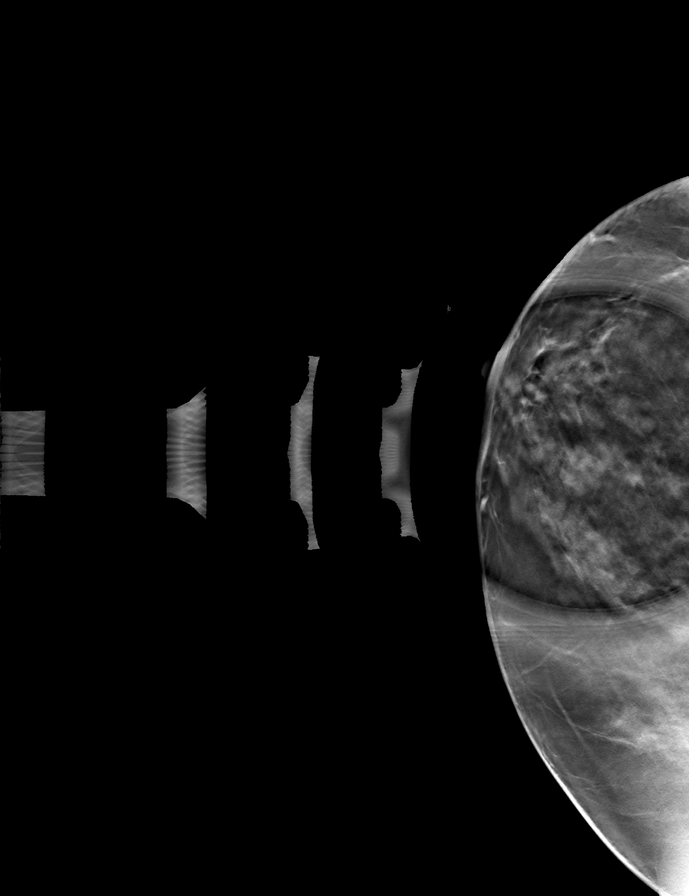

[L MLO tomo (1 of 2) · tomo slice 41/81.0]
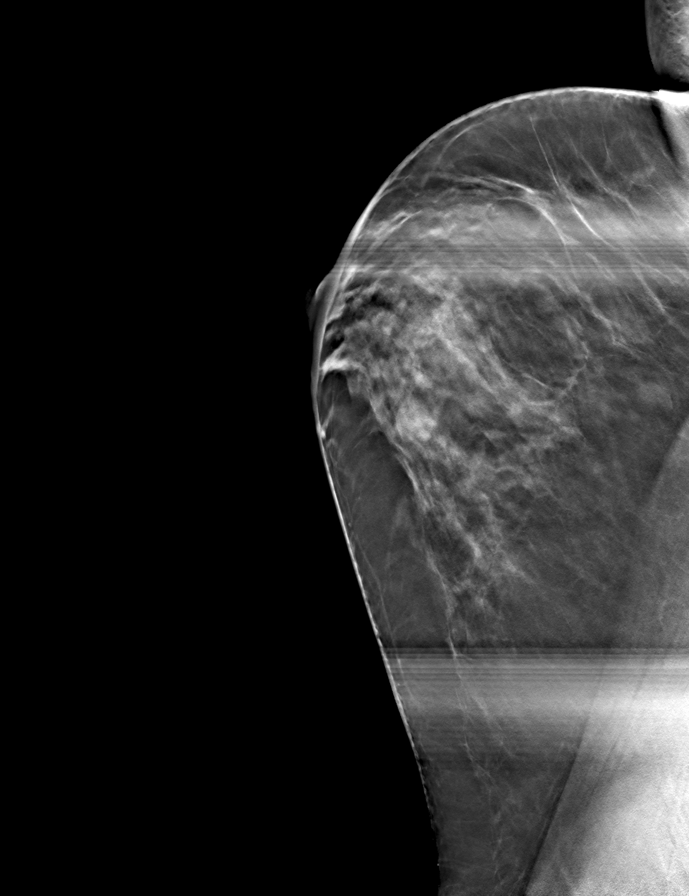

[L MLO tomo (2 of 2) · tomo slice 35/70.0]
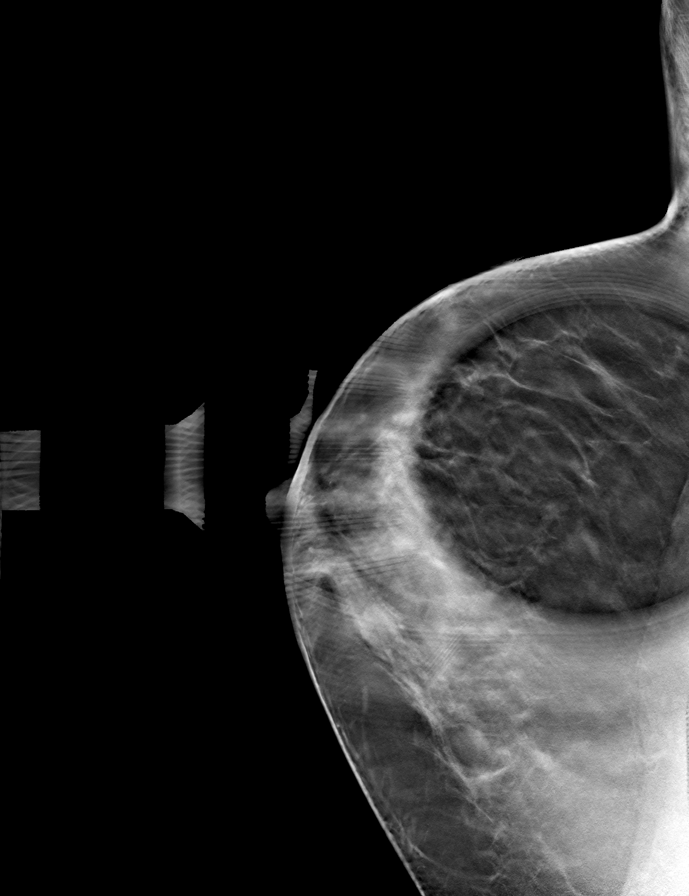

[8 of 24 positions shown; findings below may reference images not displayed]

ACR Breast Density Category b: There are scattered areas of
fibroglandular density.
FINDINGS: In the subareolar slightly upper slightly outer left breast, there
is a 1.2 cm circumscribed round mass. In the medial left breast,
posterior depth there is a oval partially obscured mass measuring
approximately 9 mm.

Mammographic images were processed with CAD.

Physical exam of the subareolar left breast demonstrates a smooth
firm palpable nodule in the subareolar breast just above the nipple.
No palpable abnormality is identified in the medial left breast to
correspond with the 9 mm mass, posterior depth on the mammogram.

Ultrasound targeted to the 4011 location, 1 cm from the nipple
demonstrates an anechoic oval circumscribed mass measuring 1.1 x
x 1.0 cm, compatible with a benign cyst. At 9 o'clock, 2 cm from the
nipple there is a hypoechoic circumscribed mass measuring 0.6 x
x 0.4 cm. This may represent a cluster of cysts versus a
fibroadenoma.
IMPRESSION: 1. A benign 1.1 cm cyst in the subareolar left breast at [DATE]
corresponds with the mammographic mass of concern.

2. A probably benign 0.6 cm hypoechoic circumscribed mass at 9
o'clock likely corresponds with the second mammographic mass of
concern in the medial left breast, posterior depth.

RECOMMENDATION:
A left breast mammogram and ultrasound is recommended in 6 months to
ensure stability of the probably benign focal asymmetry in the
medial left breast, posterior depth, which likely corresponds with
the 0.6 cm hypoechoic mass seen on ultrasound.

I have discussed the findings and recommendations with the patient.
Results were also provided in writing at the conclusion of the
visit. If applicable, a reminder letter will be sent to the patient
regarding the next appointment.

BI-RADS CATEGORY  3: Probably benign.

## 2017-01-11 ENCOUNTER — Ambulatory Visit (INDEPENDENT_AMBULATORY_CARE_PROVIDER_SITE_OTHER): Payer: BLUE CROSS/BLUE SHIELD | Admitting: Family Medicine

## 2017-01-11 ENCOUNTER — Encounter: Payer: Self-pay | Admitting: Family Medicine

## 2017-01-11 VITALS — BP 128/78 | HR 72 | Temp 98.7°F | Ht 64.0 in | Wt 235.0 lb

## 2017-01-11 DIAGNOSIS — E785 Hyperlipidemia, unspecified: Secondary | ICD-10-CM | POA: Diagnosis not present

## 2017-01-11 DIAGNOSIS — E66813 Obesity, class 3: Secondary | ICD-10-CM

## 2017-01-11 DIAGNOSIS — Z23 Encounter for immunization: Secondary | ICD-10-CM | POA: Diagnosis not present

## 2017-01-11 DIAGNOSIS — L509 Urticaria, unspecified: Secondary | ICD-10-CM | POA: Diagnosis not present

## 2017-01-11 DIAGNOSIS — G4700 Insomnia, unspecified: Secondary | ICD-10-CM | POA: Diagnosis not present

## 2017-01-11 DIAGNOSIS — I10 Essential (primary) hypertension: Secondary | ICD-10-CM | POA: Diagnosis not present

## 2017-01-11 MED ORDER — CETIRIZINE HCL 10 MG PO TABS: 10.0000 mg | ORAL_TABLET | Freq: Every day | ORAL | 11 refills | Status: DC

## 2017-01-11 MED ORDER — ZOLPIDEM TARTRATE 5 MG PO TABS: 5.0000 mg | ORAL_TABLET | Freq: Every evening | ORAL | 1 refills | Status: DC | PRN

## 2017-01-11 NOTE — Assessment & Plan Note (Signed)
Discussed diet and exercise Pt to schedule appointment with Dr Leafy Ro for weight loss

## 2017-01-11 NOTE — Progress Notes (Signed)
Patient ID: Kelly [name], female    DOB: 1965/08/23  Age: 51 y.o. MRN: [id number]    Subjective:  Subjective  HPI Kelly [name] presents for htn f/u-- she stopped the metoprolol due to feet and ankles swelling --- swelling improved after she stopped it.   Pt unable to use saxenda-- due to ins She is also c/o hives that come and go all over.  She has some on her hands now -- cause is unknown.  No new lotions, detergents, meds.   Review of Systems  Constitutional: Negative for activity change, appetite change, fatigue and unexpected weight change.  HENT: Negative for facial swelling and trouble swallowing.   Respiratory: Negative for cough, chest tightness, shortness of breath, wheezing and stridor.   Cardiovascular: Negative for chest pain, palpitations and leg swelling.  Gastrointestinal: Negative for abdominal pain and vomiting.  Skin: Positive for rash.  Psychiatric/Behavioral: Negative for behavioral problems and dysphoric mood. The patient is not nervous/anxious.     History Past Medical History:  Diagnosis Date  . Allergic rhinitis   . Anxiety   . Arthritis    feet, rt knee  . Depression with anxiety   . Heart murmur   . History of hiatal hernia    repaur 2011   . Hypertension   . Hypothyroidism   . 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 Morbid obesity ([place]) 2009  . PAC (premature atrial contraction)   . Pedunculated colonic polyp 03/04/15   tubulovillous adenoma of transverse colon.   . Pneumonia    hx of as a child   . PONV (postoperative nausea and vomiting)   . PVC (premature ventricular contraction)   . Thyroid goiter     She has a past surgical history that includes Laparoscopic gastric banding (05-23-09); Liposuction (1995); and Laparoscopic right hemi colectomy (Right, 03/25/2015).   Her family history includes Alzheimer's disease in her paternal grandfather; Aneurysm (age of onset: 72) in her mother; Diabetes in her paternal aunt; Heart  disease in her maternal grandfather; Heart disease (age of onset: 83) in her paternal grandmother; Hypertension in her father, maternal grandmother, mother, and paternal grandmother.She reports that she has never smoked. She has never used smokeless tobacco. She reports that she drinks about 1.8 oz of alcohol per week . She reports that she does not use drugs.  Current Outpatient Prescriptions on File Prior to Visit  Medication Sig Dispense Refill  . ALPRAZolam (XANAX) 0.25 MG tablet Take 1 tablet (0.25 mg total) by mouth 3 (three) times daily as needed for anxiety. 60 tablet 2  . Cholecalciferol (VITAMIN D) 2000 UNITS CAPS Take by mouth daily.    . furosemide (LASIX) 20 MG tablet Take 1 tablet (20 mg total) by mouth daily. 90 tablet 3  . ibuprofen (ADVIL,MOTRIN) 200 MG tablet You can take 2-3 tablets every 6 hours as needed for pain. 30 tablet 0  . levothyroxine (SYNTHROID, LEVOTHROID) 50 MCG tablet Take 1 tablet (50 mcg total) by mouth daily before breakfast. 30 tablet 0  . progesterone (PROMETRIUM) 200 MG capsule   1  . traMADol (ULTRAM) 50 MG tablet Take 1-2 tablets (50-100 mg total) by mouth daily. 30 tablet 2  . valACYclovir (VALTREX) 1000 MG tablet Take 500 mg by mouth daily.      No current facility-administered medications on file prior to visit.      Objective:  Objective  Physical Exam  Constitutional: She is oriented to person, place, and time. She  appears well-developed and well-nourished.  HENT:  Head: Normocephalic and atraumatic.  Eyes: Conjunctivae and EOM are normal.  Neck: Normal range of motion. Neck supple. No JVD present. Carotid bruit is not present. No thyromegaly present.  Cardiovascular: Normal rate and regular rhythm.   Murmur heard. Pulmonary/Chest: Effort normal and breath sounds normal. No respiratory distress. She has no wheezes. She has no rales. She exhibits no tenderness.  Abdominal: Soft. She exhibits no distension and no mass. There is no tenderness.  There is no rebound and no guarding.  Musculoskeletal: She exhibits no edema.  Neurological: She is alert and oriented to person, place, and time.  Psychiatric: She has a normal mood and affect.  Nursing note and vitals reviewed.  BP 128/78   Pulse 72   Temp 98.7 F (37.1 C) (Oral)   Ht '5\' 4"'$  (1.626 m)   Wt 235 lb (106.6 kg)   SpO2 99%   BMI 40.34 kg/m  Wt Readings from Last 3 Encounters:  01/11/17 235 lb (106.6 kg)  10/12/16 230 lb 9.6 oz (104.6 kg)  07/27/16 224 lb 12.8 oz (102 kg)     Lab Results  Component Value Date   WBC 6.0 05/11/2016   HGB 13.2 05/11/2016   HCT 39.1 05/11/2016   PLT 302.0 05/11/2016   GLUCOSE 83 07/27/2016   CHOL 194 05/11/2016   TRIG 55.0 05/11/2016   HDL 68.20 05/11/2016   LDLCALC 115 (H) 05/11/2016   ALT 9 05/11/2016   AST 15 05/11/2016   NA 139 07/27/2016   K 5.3 07/27/2016   CL 102 07/27/2016   CREATININE 1.07 (H) 07/27/2016   BUN 6 (L) 07/27/2016   CO2 25 07/27/2016   TSH 2.22 05/11/2016   HGBA1C 4.9 07/27/2016   MICROALBUR <0.7 01/10/2015    US Breast Ltd Uni Left Inc Axilla  Result Date: 02/10/2016 CLINICAL DATA:  Patient recalled from screening for left breast asymmetry. EXAM: 2D DIGITAL DIAGNOSTIC LEFT MAMMOGRAM WITH CAD AND ADJUNCT TOMO ULTRASOUND LEFT BREAST COMPARISON:  Previous exam(s). ACR Breast Density Category b: There are scattered areas of fibroglandular density. FINDINGS: Within the lateral aspect of the left breast on the CC view there is a persistent oval circumscribed low-density 6 mm mass. Mammographic images were processed with CAD. On physical exam, I palpate no discrete mass within the lateral left breast. Targeted ultrasound is performed, showing a 9 x 6 x 8 mm benign cluster of cysts within the left breast 3 o'clock position 2 cm from the nipple. Within the left breast 2 o'clock position 2 cm from the nipple there are 2 adjacent simple cysts measuring 7 and 8 mm respectively. IMPRESSION: Left breast benign cyst  and cluster of cysts. No mammographic evidence for malignancy. RECOMMENDATION: Screening mammogram in one year.(Code:SM-B-01Y) I have discussed the findings and recommendations with the patient. Results were also provided in writing at the conclusion of the visit. If applicable, a reminder letter will be sent to the patient regarding the next appointment. BI-RADS CATEGORY  2: Benign. Electronically Signed   By: Lovey Newcomer M.D.   On: 02/10/2016 14:03   Mm Diag Breast Tomo Uni Left  Result Date: 02/10/2016 CLINICAL DATA:  Patient recalled from screening for left breast asymmetry. EXAM: 2D DIGITAL DIAGNOSTIC LEFT MAMMOGRAM WITH CAD AND ADJUNCT TOMO ULTRASOUND LEFT BREAST COMPARISON:  Previous exam(s). ACR Breast Density Category b: There are scattered areas of fibroglandular density. FINDINGS: Within the lateral aspect of the left breast on the CC view there is a  persistent oval circumscribed low-density 6 mm mass. Mammographic images were processed with CAD. On physical exam, I palpate no discrete mass within the lateral left breast. Targeted ultrasound is performed, showing a 9 x 6 x 8 mm benign cluster of cysts within the left breast 3 o'clock position 2 cm from the nipple. Within the left breast 2 o'clock position 2 cm from the nipple there are 2 adjacent simple cysts measuring 7 and 8 mm respectively. IMPRESSION: Left breast benign cyst and cluster of cysts. No mammographic evidence for malignancy. RECOMMENDATION: Screening mammogram in one year.(Code:SM-B-01Y) I have discussed the findings and recommendations with the patient. Results were also provided in writing at the conclusion of the visit. If applicable, a reminder letter will be sent to the patient regarding the next appointment. BI-RADS CATEGORY  2: Benign. Electronically Signed   By: Lovey Newcomer M.D.   On: 02/10/2016 14:03     Assessment & Plan:  Plan  I have discontinued Ms. Perales's zolpidem, Liraglutide -Weight Management, ondansetron,  escitalopram, and metoprolol succinate. I am also having her start on zolpidem and cetirizine. Additionally, I am having her maintain her valACYclovir, Vitamin D, ibuprofen, furosemide, levothyroxine, ALPRAZolam, traMADol, and progesterone.  Meds ordered this encounter  Medications  . zolpidem (AMBIEN) 5 MG tablet    Sig: Take 1 tablet (5 mg total) by mouth at bedtime as needed for sleep.    Dispense:  30 tablet    Refill:  1  . cetirizine (ZYRTEC) 10 MG tablet    Sig: Take 1 tablet (10 mg total) by mouth daily.    Dispense:  30 tablet    Refill:  11    Problem List Items Addressed This Visit      Unprioritized   Essential hypertension    Well controlled, no changes to meds. Encouraged heart healthy diet such as the DASH diet and exercise as tolerated.       Relevant Orders   CBC w/Diff   Comp Met (CMET)   Lipid panel   TSH   Hives    Hives come and go Zyrtec daily Consider all referral       Relevant Medications   cetirizine (ZYRTEC) 10 MG tablet   Obesity, Class III, BMI 40-49.9 (morbid obesity) ([place])    Discussed diet and exercise Pt to schedule appointment with Dr Leafy Ro for weight loss       Other Visit Diagnoses    Need for immunization against influenza    -  Primary   Relevant Orders   Flu Vaccine QUAD 6+ mos IM (Fluarix) (Completed)   Insomnia, unspecified type       Relevant Medications   zolpidem (AMBIEN) 5 MG tablet   Hyperlipidemia, unspecified hyperlipidemia type       Relevant Orders   Lipid panel      Follow-up: Return in about 6 months (around 07/12/2017), or if symptoms worsen or fail to improve, for annual exam, fasting.  Ann Held, DO

## 2017-01-11 NOTE — Patient Instructions (Signed)

## 2017-01-11 NOTE — Assessment & Plan Note (Signed)
Well controlled, no changes to meds. Encouraged heart healthy diet such as the DASH diet and exercise as tolerated.  °

## 2017-01-13 DIAGNOSIS — L509 Urticaria, unspecified: Secondary | ICD-10-CM | POA: Insufficient documentation

## 2017-01-13 NOTE — Assessment & Plan Note (Signed)
Hives come and go Zyrtec daily Consider all referral

## 2017-01-14 ENCOUNTER — Other Ambulatory Visit: Payer: BLUE CROSS/BLUE SHIELD

## 2017-01-30 ENCOUNTER — Other Ambulatory Visit (INDEPENDENT_AMBULATORY_CARE_PROVIDER_SITE_OTHER): Payer: BLUE CROSS/BLUE SHIELD

## 2017-01-30 DIAGNOSIS — I10 Essential (primary) hypertension: Secondary | ICD-10-CM | POA: Diagnosis not present

## 2017-01-30 DIAGNOSIS — E785 Hyperlipidemia, unspecified: Secondary | ICD-10-CM | POA: Diagnosis not present

## 2017-01-30 LAB — LIPID PANEL
CHOLESTEROL: 217 mg/dL — AB (ref 0–200)
HDL: 67.8 mg/dL (ref >39.00)
LDL Cholesterol: 131 mg/dL — ABNORMAL HIGH (ref 0–99)
NonHDL: 149.67
TRIGLYCERIDES: 91 mg/dL (ref 0.0–149.0)
Total CHOL/HDL Ratio: 3
VLDL: 18.2 mg/dL (ref 0.0–40.0)

## 2017-01-30 LAB — COMPREHENSIVE METABOLIC PANEL
ALBUMIN: 3.6 g/dL (ref 3.5–5.2)
ALK PHOS: 71 U/L (ref 39–117)
ALT: 8 U/L (ref 0–35)
AST: 12 U/L (ref 0–37)
BUN: 13 mg/dL (ref 6–23)
CO2: 27 mEq/L (ref 19–32)
Calcium: 9.3 mg/dL (ref 8.4–10.5)
Chloride: 106 mEq/L (ref 96–112)
Creatinine, Ser: 0.99 mg/dL (ref 0.40–1.20)
GFR: 75.67 mL/min (ref >60.00)
Glucose, Bld: 89 mg/dL (ref 70–99)
POTASSIUM: 3.8 meq/L (ref 3.5–5.1)
Sodium: 140 mEq/L (ref 135–145)
TOTAL PROTEIN: 7.2 g/dL (ref 6.0–8.3)
Total Bilirubin: 0.6 mg/dL (ref 0.2–1.2)

## 2017-01-30 LAB — CBC WITH DIFFERENTIAL/PLATELET
BASOS ABS: 0.1 10*3/uL (ref 0.0–0.1)
Basophils Relative: 1.5 % (ref 0.0–3.0)
EOS PCT: 3.7 % (ref 0.0–5.0)
Eosinophils Absolute: 0.3 10*3/uL (ref 0.0–0.7)
HCT: 41.8 % (ref 36.0–46.0)
Hemoglobin: 13.9 g/dL (ref 12.0–15.0)
Lymphocytes Relative: 41.3 % (ref 12.0–46.0)
Lymphs Abs: 3.1 10*3/uL (ref 0.7–4.0)
MCHC: 33.1 g/dL (ref 30.0–36.0)
MCV: 93.6 fl (ref 78.0–100.0)
MONO ABS: 0.4 10*3/uL (ref 0.1–1.0)
Monocytes Relative: 5.2 % (ref 3.0–12.0)
Neutro Abs: 3.6 10*3/uL (ref 1.4–7.7)
Neutrophils Relative %: 48.3 % (ref 43.0–77.0)
Platelets: 300 10*3/uL (ref 150.0–400.0)
RBC: 4.47 Mil/uL (ref 3.87–5.11)
RDW: 12.3 % (ref 11.5–15.5)
WBC: 7.4 10*3/uL (ref 4.0–10.5)

## 2017-01-30 LAB — TSH: TSH: 3.2 u[IU]/mL (ref 0.35–4.50)

## 2017-02-04 ENCOUNTER — Telehealth: Payer: Self-pay | Admitting: *Deleted

## 2017-02-04 DIAGNOSIS — I10 Essential (primary) hypertension: Secondary | ICD-10-CM

## 2017-02-04 NOTE — Telephone Encounter (Signed)
See my chart message

## 2017-02-11 ENCOUNTER — Encounter: Payer: Self-pay | Admitting: Family Medicine

## 2017-02-11 LAB — HM PAP SMEAR: HM PAP: NORMAL

## 2017-02-18 ENCOUNTER — Encounter: Payer: Self-pay | Admitting: Family Medicine

## 2017-02-18 LAB — HM MAMMOGRAPHY

## 2017-02-26 ENCOUNTER — Other Ambulatory Visit: Payer: Self-pay | Admitting: Family Medicine

## 2017-02-26 DIAGNOSIS — M1990 Unspecified osteoarthritis, unspecified site: Secondary | ICD-10-CM

## 2017-02-27 NOTE — Telephone Encounter (Signed)
[facility] [address] requesting refill for tramadol  Database ran and is on your desk for review.  Last filled per database: 01/09/17 Last written: 07/27/16 Last ov: 01/11/17 Next ov: 05/31/17 Contract: none UDS: none

## 2017-02-28 ENCOUNTER — Other Ambulatory Visit: Payer: Self-pay | Admitting: Family Medicine

## 2017-02-28 DIAGNOSIS — M1990 Unspecified osteoarthritis, unspecified site: Secondary | ICD-10-CM

## 2017-02-28 MED ORDER — TRAMADOL HCL 50 MG PO TABS: 50.0000 mg | ORAL_TABLET | Freq: Every day | ORAL | 2 refills | Status: DC

## 2017-03-27 IMAGING — DX DG ABDOMEN 2V
2 series · 2 of 2 positions shown · non-contrast
Comparison: 01/18/2014

CLINICAL DATA: Upper quadrant pain, status post colonoscopy

EXAM:
ABDOMEN - 2 VIEW

[abdomen erect]
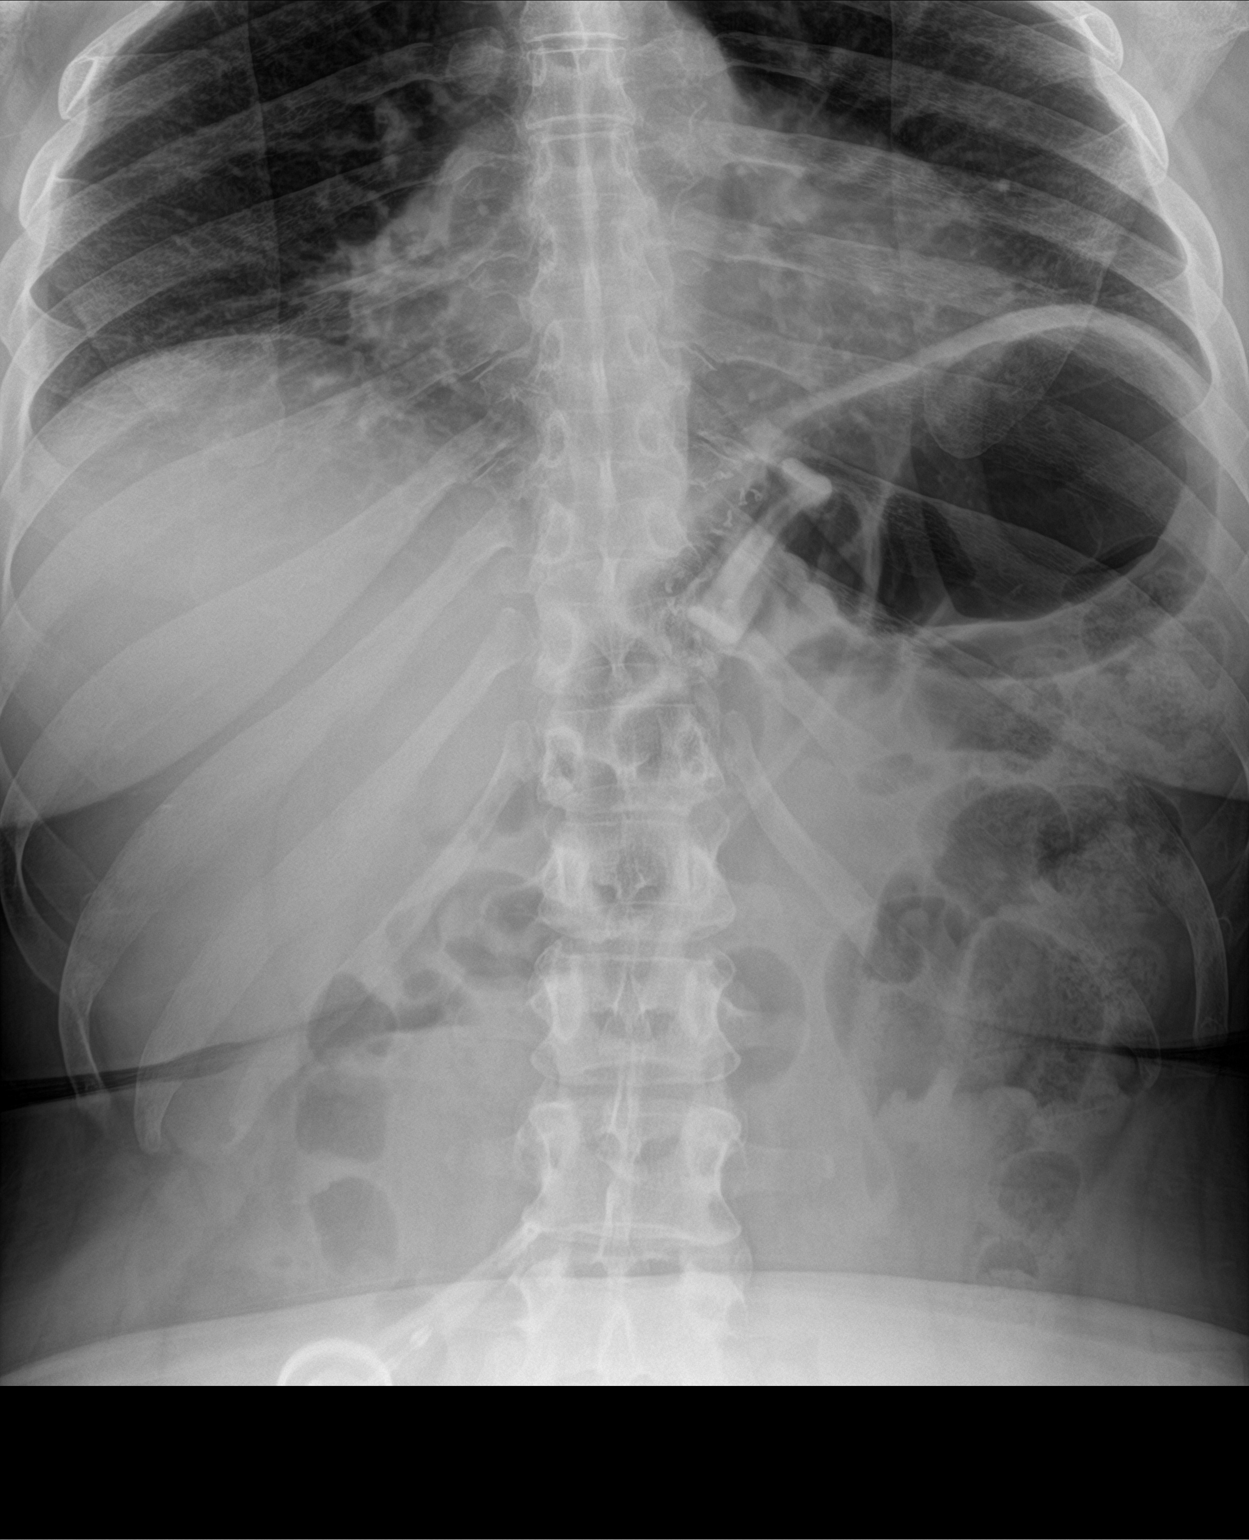

[abdomen supine]
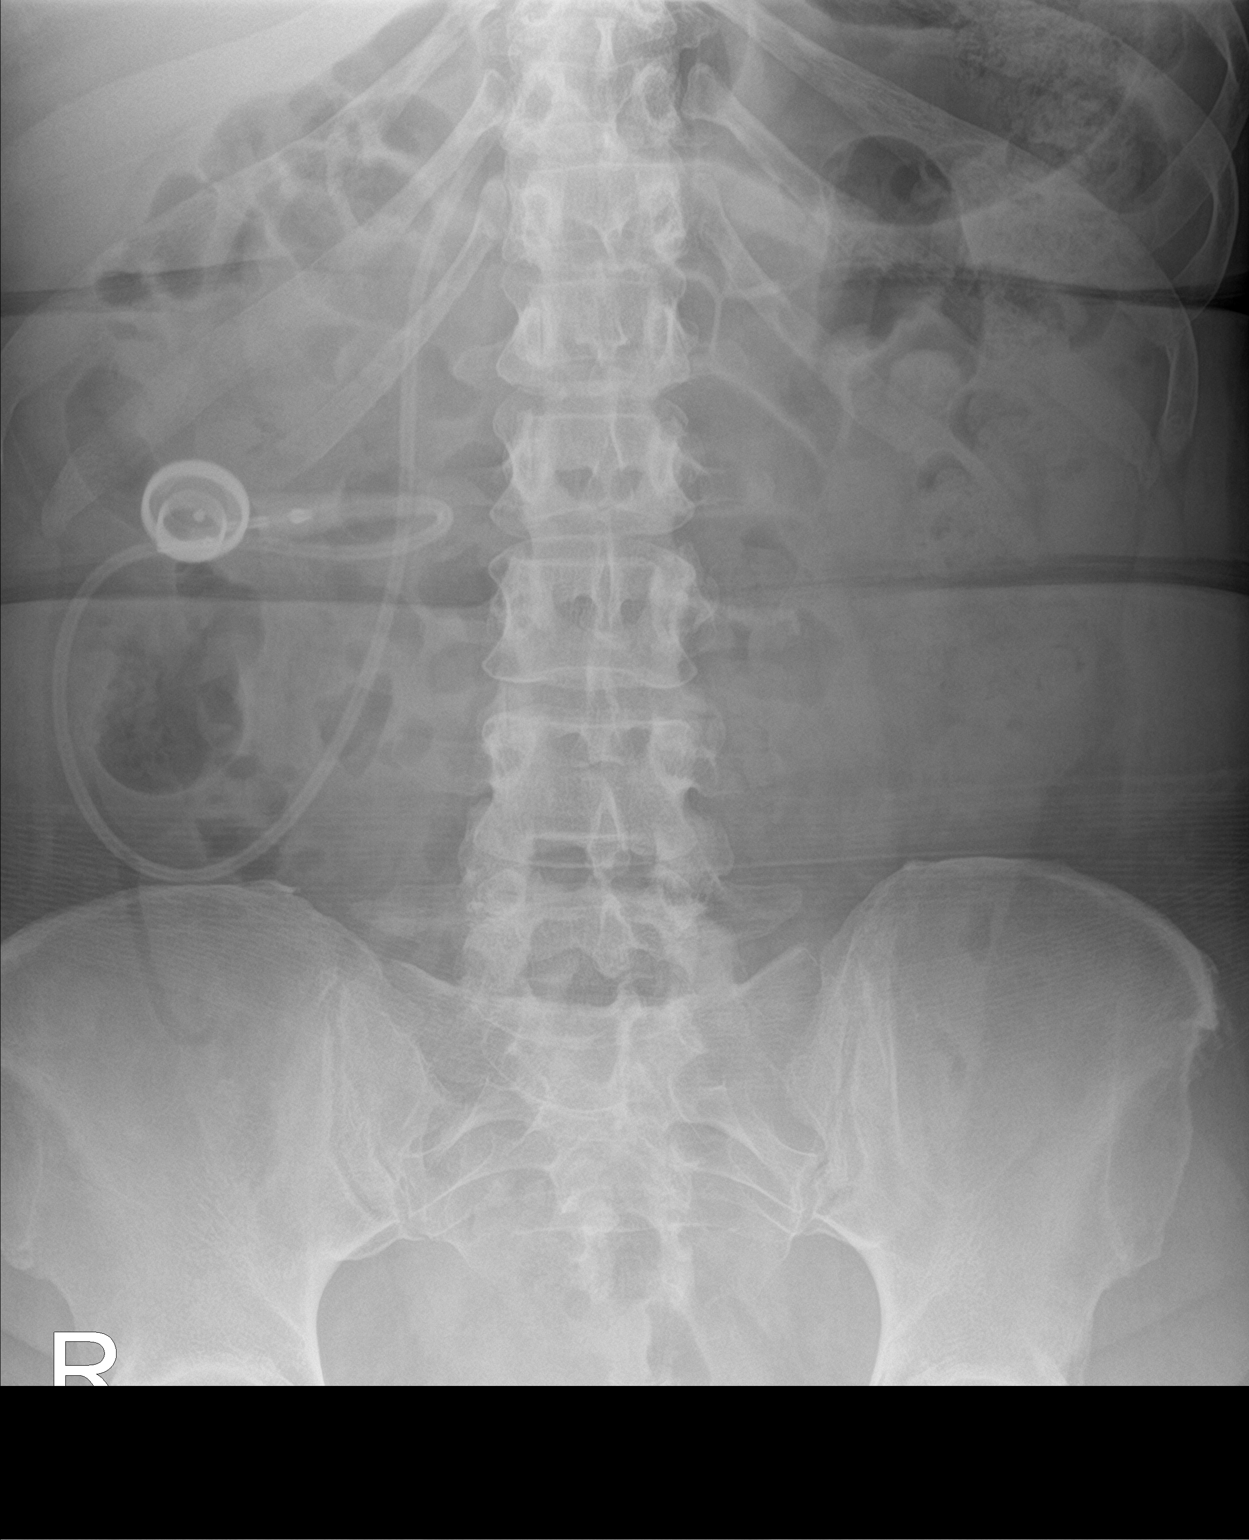

[2 of 2 positions shown; findings below may reference images not displayed]

FINDINGS: There is normal small bowel gas pattern. Moderate gas noted within
stomach. Gastric lap band in proximal stomach is unchanged in
position. Moderate stool noted in splenic flexure and proximal left
colon. No free abdominal air.
IMPRESSION: Stable gastric lap band position. Moderate gas noted within stomach.
Moderate stool noted in splenic flexure and proximal left colon.
Normal small bowel gas pattern.

## 2017-04-16 ENCOUNTER — Other Ambulatory Visit: Payer: Self-pay | Admitting: Family Medicine

## 2017-04-16 DIAGNOSIS — G4700 Insomnia, unspecified: Secondary | ICD-10-CM

## 2017-04-17 NOTE — Telephone Encounter (Signed)
Last filled per database: 03/15/2017  Last written: 01/11/2017 Last ov: 01/11/2017 Next ov: 05/31/2017 Contract: Yes  UDS: Over due since 2015

## 2017-05-31 ENCOUNTER — Encounter: Payer: Self-pay | Admitting: Family Medicine

## 2017-05-31 ENCOUNTER — Ambulatory Visit (INDEPENDENT_AMBULATORY_CARE_PROVIDER_SITE_OTHER): Payer: BLUE CROSS/BLUE SHIELD | Admitting: Family Medicine

## 2017-05-31 VITALS — BP 128/76 | HR 87 | Temp 98.4°F | Resp 16 | Ht 64.17 in

## 2017-05-31 DIAGNOSIS — Z0000 Encounter for general adult medical examination without abnormal findings: Secondary | ICD-10-CM | POA: Diagnosis not present

## 2017-05-31 DIAGNOSIS — Z23 Encounter for immunization: Secondary | ICD-10-CM | POA: Diagnosis not present

## 2017-05-31 NOTE — Progress Notes (Signed)
Subjective:  I acted as a Education administrator for [employer]. Kelly [name], [name]   Patient ID: Kelly [name], female    DOB: 1965-10-22, 52 y.o.   MRN: [id number]  Chief Complaint  Patient presents with  . Annual Exam    HPI  Patient is in today for annual exam.  No complaints   Patient Care Team: Carollee Herter, Alferd Apa, DO as PCP - General Excell Seltzer, MD as Consulting Physician (General Surgery) Milus Banister, MD as Attending Physician (Gastroenterology)   Past Medical History:  Diagnosis Date  . Allergic rhinitis   . Anxiety   . Arthritis    feet, rt knee  . Depression with anxiety   . Heart murmur   . History of hiatal hernia    repaur 2011   . Hypertension   . Hypothyroidism   . Intussusception of colon ([place]) 52/84/13   ileocolic  . Morbid obesity ([place]) 2009  . PAC (premature atrial contraction)   . Pedunculated colonic polyp 03/04/15   tubulovillous adenoma of transverse colon.   . Pneumonia    hx of as a child   . PONV (postoperative nausea and vomiting)   . PVC (premature ventricular contraction)   . Thyroid goiter     Past Surgical History:  Procedure Laterality Date  . LAPAROSCOPIC GASTRIC BANDING  05-23-09   dr Abran Cantor with hiatal hernia repair.   Marland Kitchen LAPAROSCOPIC RIGHT HEMI COLECTOMY Right 03/25/2015   Procedure: LAPAROSCOPIC ASSISTED RIGHT HEMI COLECTOMY, with removal of lap band fluid prior to prepping.;  Surgeon: Alphonsa Overall, MD;  Location: [facility];  Service: General;  Laterality: Right;  . LIPOSUCTION  1995    Family History  Problem Relation Age of Onset  . Hypertension Mother   . Aneurysm Mother 53       brain  . Hypertension Father   . Hypertension Maternal Grandmother   . Hypertension Paternal Grandmother   . Heart disease Paternal Grandmother 54       MI  . Diabetes Paternal Aunt   . Heart disease Maternal Grandfather        MI  . Alzheimer's disease Paternal Grandfather   . Colon cancer Neg Hx   . Esophageal cancer Neg Hx   .  Rectal cancer Neg Hx   . Stomach cancer Neg Hx     Social History   Socioeconomic History  . Marital status: Single    Spouse name: Not on file  . Number of children: Not on file  . Years of education: Not on file  . Highest education level: Not on file  Social Needs  . Financial resource strain: Not on file  . Food insecurity - worry: Not on file  . Food insecurity - inability: Not on file  . Transportation needs - medical: Not on file  . Transportation needs - non-medical: Not on file  Occupational History  . Occupation: [employer]    Comment: CCM    Tobacco Use  . Smoking status: Never Smoker  . Smokeless tobacco: Never Used  Substance and Sexual Activity  . Alcohol use: Yes    Alcohol/week: 1.8 oz    Types: 3 Glasses of wine per week  . Drug use: No  . Sexual activity: No  Other Topics Concern  . Not on file  Social History Narrative  . Not on file    Outpatient Medications Prior to Visit  Medication Sig Dispense Refill  . ALPRAZolam (XANAX) 0.25 MG tablet Take 1 tablet (0.25  mg total) by mouth 3 (three) times daily as needed for anxiety. 60 tablet 2  . cetirizine (ZYRTEC) 10 MG tablet Take 1 tablet (10 mg total) by mouth daily. 30 tablet 11  . Cholecalciferol (VITAMIN D) 2000 UNITS CAPS Take by mouth daily.    . furosemide (LASIX) 20 MG tablet Take 1 tablet (20 mg total) by mouth daily. 90 tablet 3  . ibuprofen (ADVIL,MOTRIN) 200 MG tablet You can take 2-3 tablets every 6 hours as needed for pain. 30 tablet 0  . levothyroxine (SYNTHROID, LEVOTHROID) 50 MCG tablet Take 1 tablet (50 mcg total) by mouth daily before breakfast. 30 tablet 0  . progesterone (PROMETRIUM) 200 MG capsule   1  . traMADol (ULTRAM) 50 MG tablet Take 1-2 tablets (50-100 mg total) by mouth daily. 30 tablet 2  . valACYclovir (VALTREX) 1000 MG tablet Take 500 mg by mouth daily.     Marland Kitchen zolpidem (AMBIEN) 5 MG tablet TAKE 1 TABLET BY MOUTH EVERY DAY AT BEDTIME AS NEEDED FOR SLEEP 30 tablet 1   No  facility-administered medications prior to visit.     No Known Allergies  Review of Systems  Constitutional: Negative for chills, fever and malaise/fatigue.  HENT: Negative for congestion and hearing loss.   Eyes: Negative for discharge.  Respiratory: Negative for cough, sputum production and shortness of breath.   Cardiovascular: Negative for chest pain, palpitations and leg swelling.  Gastrointestinal: Negative for abdominal pain, blood in stool, constipation, diarrhea, heartburn, nausea and vomiting.  Genitourinary: Negative for dysuria, frequency, hematuria and urgency.  Musculoskeletal: Negative for back pain, falls and myalgias.  Skin: Negative for rash.  Neurological: Negative for dizziness, sensory change, loss of consciousness, weakness and headaches.  Endo/Heme/Allergies: Negative for environmental allergies. Does not bruise/bleed easily.  Psychiatric/Behavioral: Negative for depression and suicidal ideas. The patient is not nervous/anxious and does not have insomnia.        Objective:    Physical Exam  Constitutional: She is oriented to person, place, and time. She appears well-developed and well-nourished.  HENT:  Head: Normocephalic and atraumatic.  Eyes: Conjunctivae and EOM are normal.  Neck: Normal range of motion. Neck supple. No JVD present. Carotid bruit is not present. No thyromegaly present.  Cardiovascular: Normal rate, regular rhythm and normal heart sounds.  No murmur heard. Pulmonary/Chest: Effort normal and breath sounds normal. No respiratory distress. She has no wheezes. She has no rales. She exhibits no tenderness.  Abdominal: Soft. Bowel sounds are normal. She exhibits no distension and no mass. There is no tenderness. There is no rebound and no guarding.  Musculoskeletal: Normal range of motion. She exhibits no edema, tenderness or deformity.  Neurological: She is alert and oriented to person, place, and time.  Skin: Skin is warm and dry. No rash  noted. No erythema. No pallor.  Psychiatric: She has a normal mood and affect. Her behavior is normal. Judgment and thought content normal.  Nursing note and vitals reviewed.   BP 128/76 (BP Location: Left Arm, Patient Position: Sitting, Cuff Size: Normal)   Pulse 87   Temp 98.4 F (36.9 C) (Oral)   Resp 16   Ht 5' 4.17" (1.63 m)   SpO2 100%   BMI 40.12 kg/m  Wt Readings from Last 3 Encounters:  01/11/17 235 lb (106.6 kg)  10/12/16 230 lb 9.6 oz (104.6 kg)  07/27/16 224 lb 12.8 oz (102 kg)   BP Readings from Last 3 Encounters:  05/31/17 128/76  01/11/17 128/78  10/12/16 118/70  Immunization History  Administered Date(s) Administered  . H1N1 02/11/2008  . Influenza Whole 01/07/2008, 01/05/2009, 01/25/2011  . Influenza,inj,Quad PF,6+ Mos 12/24/2012, 01/11/2017  . Influenza-Unspecified 11/24/2013, 12/31/2014, 03/05/2016  . PPD Test 10/08/2011  . Td 03/26/1996, 08/09/2006  . Tdap 05/31/2017  . Varicella 10/09/2007, 09/13/2009    Health Maintenance  Topic Date Due  . HIV Screening  10/22/1979  . TETANUS/TDAP  08/08/2016  . MAMMOGRAM  10/04/2017  . PAP SMEAR  12/09/2017  . COLONOSCOPY  03/03/2018  . INFLUENZA VACCINE  Completed    Lab Results  Component Value Date   WBC 7.4 01/30/2017   HGB 13.9 01/30/2017   HCT 41.8 01/30/2017   PLT 300.0 01/30/2017   GLUCOSE 89 01/30/2017   CHOL 217 (H) 01/30/2017   TRIG 91.0 01/30/2017   HDL 67.80 01/30/2017   LDLCALC 131 (H) 01/30/2017   ALT 8 01/30/2017   AST 12 01/30/2017   NA 140 01/30/2017   K 3.8 01/30/2017   CL 106 01/30/2017   CREATININE 0.99 01/30/2017   BUN 13 01/30/2017   CO2 27 01/30/2017   TSH 3.20 01/30/2017   HGBA1C 4.9 07/27/2016   MICROALBUR <0.7 01/10/2015    Lab Results  Component Value Date   TSH 3.20 01/30/2017   Lab Results  Component Value Date   WBC 7.4 01/30/2017   HGB 13.9 01/30/2017   HCT 41.8 01/30/2017   MCV 93.6 01/30/2017   PLT 300.0 01/30/2017   Lab Results  Component  Value Date   NA 140 01/30/2017   K 3.8 01/30/2017   CO2 27 01/30/2017   GLUCOSE 89 01/30/2017   BUN 13 01/30/2017   CREATININE 0.99 01/30/2017   BILITOT 0.6 01/30/2017   ALKPHOS 71 01/30/2017   AST 12 01/30/2017   ALT 8 01/30/2017   PROT 7.2 01/30/2017   ALBUMIN 3.6 01/30/2017   CALCIUM 9.3 01/30/2017   ANIONGAP 8 03/26/2015   GFR 75.67 01/30/2017   Lab Results  Component Value Date   CHOL 217 (H) 01/30/2017   Lab Results  Component Value Date   HDL 67.80 01/30/2017   Lab Results  Component Value Date   LDLCALC 131 (H) 01/30/2017   Lab Results  Component Value Date   TRIG 91.0 01/30/2017   Lab Results  Component Value Date   CHOLHDL 3 01/30/2017   Lab Results  Component Value Date   HGBA1C 4.9 07/27/2016         Assessment & Plan:   Problem List Items Addressed This Visit      Unprioritized   Preventative health care - Primary    ghm utd Check labs  See AVS Tdap given today      Relevant Orders   CBC with Differential/Platelet   Comprehensive metabolic panel   Lipid panel   TSH    Other Visit Diagnoses    Need for diphtheria-tetanus-pertussis (Tdap) vaccine       Relevant Orders   Tdap vaccine greater than or equal to 7yo IM (Completed)      I am having Antia L. Dalbert Batman maintain her valACYclovir, Vitamin D, ibuprofen, furosemide, levothyroxine, ALPRAZolam, progesterone, cetirizine, traMADol, and zolpidem.  No orders of the defined types were placed in this encounter.   CMA served as Education administrator during this visit. History, Physical and Plan performed by medical provider. Documentation and orders reviewed and attested to.  Ann Held, DO

## 2017-05-31 NOTE — Assessment & Plan Note (Signed)
ghm utd Check labs  See AVS Tdap given today

## 2017-05-31 NOTE — Patient Instructions (Signed)
Preventive Care 40-64 Years, Female Preventive care refers to lifestyle choices and visits with your health care provider that can promote health and wellness. What does preventive care include?  A yearly physical exam. This is also called an annual well check.  Dental exams once or twice a year.  Routine eye exams. Ask your health care provider how often you should have your eyes checked.  Personal lifestyle choices, including: ? Daily care of your teeth and gums. ? Regular physical activity. ? Eating a healthy diet. ? Avoiding tobacco and drug use. ? Limiting alcohol use. ? Practicing safe sex. ? Taking low-dose aspirin daily starting at age 58. ? Taking vitamin and mineral supplements as recommended by your health care provider. What happens during an annual well check? The services and screenings done by your health care provider during your annual well check will depend on your age, overall health, lifestyle risk factors, and family history of disease. Counseling Your health care provider may ask you questions about your:  Alcohol use.  Tobacco use.  Drug use.  Emotional well-being.  Home and relationship well-being.  Sexual activity.  Eating habits.  Work and work Statistician.  Method of birth control.  Menstrual cycle.  Pregnancy history.  Screening You may have the following tests or measurements:  Height, weight, and BMI.  Blood pressure.  Lipid and cholesterol levels. These may be checked every 5 years, or more frequently if you are over 81 years old.  Skin check.  Lung cancer screening. You may have this screening every year starting at age 78 if you have a 30-pack-year history of smoking and currently smoke or have quit within the past 15 years.  Fecal occult blood test (FOBT) of the stool. You may have this test every year starting at age 65.  Flexible sigmoidoscopy or colonoscopy. You may have a sigmoidoscopy every 5 years or a colonoscopy  every 10 years starting at age 30.  Hepatitis C blood test.  Hepatitis B blood test.  Sexually transmitted disease (STD) testing.  Diabetes screening. This is done by checking your blood sugar (glucose) after you have not eaten for a while (fasting). You may have this done every 1-3 years.  Mammogram. This may be done every 1-2 years. Talk to your health care provider about when you should start having regular mammograms. This may depend on whether you have a family history of breast cancer.  BRCA-related cancer screening. This may be done if you have a family history of breast, ovarian, tubal, or peritoneal cancers.  Pelvic exam and Pap test. This may be done every 3 years starting at age 80. Starting at age 36, this may be done every 5 years if you have a Pap test in combination with an HPV test.  Bone density scan. This is done to screen for osteoporosis. You may have this scan if you are at high risk for osteoporosis.  Discuss your test results, treatment options, and if necessary, the need for more tests with your health care provider. Vaccines Your health care provider may recommend certain vaccines, such as:  Influenza vaccine. This is recommended every year.  Tetanus, diphtheria, and acellular pertussis (Tdap, Td) vaccine. You may need a Td booster every 10 years.  Varicella vaccine. You may need this if you have not been vaccinated.  Zoster vaccine. You may need this after age 5.  Measles, mumps, and rubella (MMR) vaccine. You may need at least one dose of MMR if you were born in  1958 or later. You may also need a second dose.  Pneumococcal 13-valent conjugate (PCV13) vaccine. You may need this if you have certain conditions and were not previously vaccinated.  Pneumococcal polysaccharide (PPSV23) vaccine. You may need one or two doses if you smoke cigarettes or if you have certain conditions.  Meningococcal vaccine. You may need this if you have certain  conditions.  Hepatitis A vaccine. You may need this if you have certain conditions or if you travel or work in places where you may be exposed to hepatitis A.  Hepatitis B vaccine. You may need this if you have certain conditions or if you travel or work in places where you may be exposed to hepatitis B.  Haemophilus influenzae type b (Hib) vaccine. You may need this if you have certain conditions.  Talk to your health care provider about which screenings and vaccines you need and how often you need them. This information is not intended to replace advice given to you by your health care provider. Make sure you discuss any questions you have with your health care provider. Document Released: 04/08/2015 Document Revised: 11/30/2015 Document Reviewed: 01/11/2015 Elsevier Interactive Patient Education  2018 Elsevier Inc.  

## 2017-06-03 ENCOUNTER — Other Ambulatory Visit (INDEPENDENT_AMBULATORY_CARE_PROVIDER_SITE_OTHER): Payer: BLUE CROSS/BLUE SHIELD

## 2017-06-03 DIAGNOSIS — Z0000 Encounter for general adult medical examination without abnormal findings: Secondary | ICD-10-CM

## 2017-06-03 LAB — CBC WITH DIFFERENTIAL/PLATELET
Basophils Absolute: 0.1 10*3/uL (ref 0.0–0.1)
Basophils Relative: 1.3 % (ref 0.0–3.0)
EOS ABS: 0.3 10*3/uL (ref 0.0–0.7)
Eosinophils Relative: 4 % (ref 0.0–5.0)
HCT: 42.7 % (ref 36.0–46.0)
Hemoglobin: 14.5 g/dL (ref 12.0–15.0)
LYMPHS ABS: 2.8 10*3/uL (ref 0.7–4.0)
Lymphocytes Relative: 44.4 % (ref 12.0–46.0)
MCHC: 34.1 g/dL (ref 30.0–36.0)
MCV: 90 fl (ref 78.0–100.0)
MONO ABS: 0.4 10*3/uL (ref 0.1–1.0)
Monocytes Relative: 6.4 % (ref 3.0–12.0)
Neutro Abs: 2.8 10*3/uL (ref 1.4–7.7)
Neutrophils Relative %: 43.9 % (ref 43.0–77.0)
Platelets: 334 10*3/uL (ref 150.0–400.0)
RBC: 4.74 Mil/uL (ref 3.87–5.11)
RDW: 12.6 % (ref 11.5–15.5)
WBC: 6.4 10*3/uL (ref 4.0–10.5)

## 2017-06-03 LAB — LIPID PANEL
CHOLESTEROL: 151 mg/dL (ref 0–200)
HDL: 51.3 mg/dL (ref >39.00)
LDL Cholesterol: 81 mg/dL (ref 0–99)
NONHDL: 99.27
Total CHOL/HDL Ratio: 3
Triglycerides: 91 mg/dL (ref 0.0–149.0)
VLDL: 18.2 mg/dL (ref 0.0–40.0)

## 2017-06-03 LAB — COMPREHENSIVE METABOLIC PANEL
ALBUMIN: 3.8 g/dL (ref 3.5–5.2)
ALK PHOS: 72 U/L (ref 39–117)
ALT: 6 U/L (ref 0–35)
AST: 11 U/L (ref 0–37)
BILIRUBIN TOTAL: 0.6 mg/dL (ref 0.2–1.2)
BUN: 9 mg/dL (ref 6–23)
CO2: 28 mEq/L (ref 19–32)
CREATININE: 0.93 mg/dL (ref 0.40–1.20)
Calcium: 9.6 mg/dL (ref 8.4–10.5)
Chloride: 104 mEq/L (ref 96–112)
GFR: 81.22 mL/min (ref >60.00)
GLUCOSE: 84 mg/dL (ref 70–99)
Potassium: 3.7 mEq/L (ref 3.5–5.1)
SODIUM: 138 meq/L (ref 135–145)
TOTAL PROTEIN: 7.5 g/dL (ref 6.0–8.3)

## 2017-06-04 LAB — TSH: TSH: 4.54 u[IU]/mL — ABNORMAL HIGH (ref 0.35–4.50)

## 2017-06-05 ENCOUNTER — Other Ambulatory Visit: Payer: Self-pay | Admitting: *Deleted

## 2017-06-05 ENCOUNTER — Encounter: Payer: Self-pay | Admitting: *Deleted

## 2017-06-05 DIAGNOSIS — E039 Hypothyroidism, unspecified: Secondary | ICD-10-CM

## 2017-06-05 DIAGNOSIS — R002 Palpitations: Secondary | ICD-10-CM

## 2017-06-05 DIAGNOSIS — R000 Tachycardia, unspecified: Secondary | ICD-10-CM

## 2017-06-05 MED ORDER — LEVOTHYROXINE SODIUM 75 MCG PO TABS: 75.0000 ug | ORAL_TABLET | Freq: Every day | ORAL | 2 refills | Status: DC

## 2017-06-17 ENCOUNTER — Other Ambulatory Visit: Payer: Self-pay | Admitting: Family Medicine

## 2017-06-17 DIAGNOSIS — G4700 Insomnia, unspecified: Secondary | ICD-10-CM

## 2017-06-19 NOTE — Telephone Encounter (Signed)
[facility] [address] requesting refill for ambien  Database ran and is on your desk for review.  Last filled per database:  05/19/17 Last written: 04/17/17 Last ov: 05/31/17 Next ov: 12/06/17 Contract: none UDS: none

## 2017-08-16 ENCOUNTER — Other Ambulatory Visit: Payer: Self-pay | Admitting: Family Medicine

## 2017-08-16 DIAGNOSIS — G4700 Insomnia, unspecified: Secondary | ICD-10-CM

## 2017-08-16 DIAGNOSIS — R000 Tachycardia, unspecified: Secondary | ICD-10-CM

## 2017-08-16 DIAGNOSIS — M1990 Unspecified osteoarthritis, unspecified site: Secondary | ICD-10-CM

## 2017-08-16 DIAGNOSIS — R002 Palpitations: Secondary | ICD-10-CM

## 2017-08-16 MED ORDER — ALPRAZOLAM 0.25 MG PO TABS: 0.2500 mg | ORAL_TABLET | Freq: Three times a day (TID) | ORAL | 0 refills | Status: DC | PRN

## 2017-08-16 MED ORDER — ZOLPIDEM TARTRATE 5 MG PO TABS: 5.0000 mg | ORAL_TABLET | Freq: Every evening | ORAL | 0 refills | Status: DC | PRN

## 2017-08-16 MED ORDER — TRAMADOL HCL 50 MG PO TABS: 50.0000 mg | ORAL_TABLET | Freq: Every day | ORAL | 0 refills | Status: DC

## 2017-08-16 NOTE — Telephone Encounter (Signed)
Requesting: Tramadol 50mg  (1-2tabs) daily, #30, 2 refills Contract: 2014 UDS: no Last OV: 05/31/17 Next Ov:12/06/17 Last refill: 02/28/17  Database: OD risk score 90  Requesting: Alprazolam 0.25mg  tid prn #60, 2 refills Contract: 2014 UDS: no Last OV: 05/31/17 Next Ov: 12/06/17 Last refill: 05/14/17 Database: OD risk score 90  Requesting: Zolpidem 5mg  HS prn #30, 1 refill Contract: 2014 UDS: no Last OV: 05/31/17 Next Ov: 12/06/17 Last refill: 06/19/17 Database: OD risk score 90  Author spoke with patient over the phone, who confirmed that she only has about 2 pills left of each medication. Due to the lack of a recent CSC and UDS, and next office visit not until September 2019, author asked pt. to make an appointment with Dr. Etter Sjogren. Appointment made for 6/21at 245PM. Medication orders pended for one month supplies' worth. Awaiting Dr. Nonda Lou approval. Pt. made aware.

## 2017-08-16 NOTE — Telephone Encounter (Signed)
Alprazolam (xanax) refill Last OV: 05/31/17 Last Refill:05/14/16 #60 tab 2 RF [facility] [address] PCP: Dr Carollee Herter  Zolpidem Lorrin Mais) refill Last OV: 05/31/17 Last Refill:06/19/17 Pharmacy [facility] [phone]

## 2017-08-16 NOTE — Telephone Encounter (Signed)
Copied from [place] #[id number]. Topic: Quick Communication - Rx Refill/Question >> Aug 16, 2017  9:24 AM Robina Ade, Helene Kelp D wrote: Medication: ALPRAZolam (XANAX) 0.25 MG tablet,zolpidem (AMBIEN) 5 MG tablet,traMADol (ULTRAM) 50 MG tablet  Has the patient contacted their pharmacy?Yes (Agent: If no, request that the patient contact the pharmacy for the refill.) (Agent: If yes, when and what did the pharmacy advise?)  Preferred Pharmacy (with phone number or street name): [place], [place] - [address]  Agent: Please be advised that RX refills may take up to 3 business days. We ask that you follow-up with your pharmacy.

## 2017-09-13 ENCOUNTER — Ambulatory Visit: Payer: BLUE CROSS/BLUE SHIELD | Admitting: Family Medicine

## 2017-10-09 ENCOUNTER — Telehealth: Payer: Self-pay | Admitting: *Deleted

## 2017-10-09 NOTE — Telephone Encounter (Signed)
Copied from [place] [id number]. Topic: Appointment Scheduling - Scheduling Inquiry for Clinic >> Oct 08, 2017  1:18 PM Percell Belt A wrote: Reason for CRM:  pt called in and stated that she has gotten a new job with new ins and has to have cpe with in 24 days of hire date.  Pt is needs to have it completed by the end of aug.  Dr Etter Sjogren doesn't not have any cpe open by then.  Could pt possibly be worked in?    Best number [phone]

## 2017-10-14 NOTE — Telephone Encounter (Signed)
Put her in on a Tuesday evening-- she just needs to know she may not be able to get labs done

## 2017-10-14 NOTE — Telephone Encounter (Signed)
Pt. requesting CPE by end of August per insurance. Routed to Dr. Etter Sjogren to advise on best time/day.

## 2017-10-16 NOTE — Telephone Encounter (Signed)
Author phoned pt. To schedule CPE appointment for Tuesday evening in August per Dr. Nonda Lou approval. Appointment made for 8/6 at [facility].

## 2017-10-29 ENCOUNTER — Encounter: Payer: Self-pay | Admitting: Family Medicine

## 2017-10-29 ENCOUNTER — Ambulatory Visit (INDEPENDENT_AMBULATORY_CARE_PROVIDER_SITE_OTHER): Payer: PRIVATE HEALTH INSURANCE | Admitting: Family Medicine

## 2017-10-29 VITALS — BP 145/70 | HR 90 | Temp 98.4°F | Resp 16 | Ht 64.0 in | Wt 257.8 lb

## 2017-10-29 DIAGNOSIS — R600 Localized edema: Secondary | ICD-10-CM

## 2017-10-29 DIAGNOSIS — R000 Tachycardia, unspecified: Secondary | ICD-10-CM

## 2017-10-29 DIAGNOSIS — Z23 Encounter for immunization: Secondary | ICD-10-CM

## 2017-10-29 DIAGNOSIS — E039 Hypothyroidism, unspecified: Secondary | ICD-10-CM

## 2017-10-29 DIAGNOSIS — M791 Myalgia, unspecified site: Secondary | ICD-10-CM

## 2017-10-29 DIAGNOSIS — R002 Palpitations: Secondary | ICD-10-CM

## 2017-10-29 DIAGNOSIS — Z6841 Body Mass Index (BMI) 40.0 and over, adult: Secondary | ICD-10-CM

## 2017-10-29 DIAGNOSIS — Z114 Encounter for screening for human immunodeficiency virus [HIV]: Secondary | ICD-10-CM | POA: Diagnosis not present

## 2017-10-29 DIAGNOSIS — Z0001 Encounter for general adult medical examination with abnormal findings: Secondary | ICD-10-CM | POA: Diagnosis not present

## 2017-10-29 DIAGNOSIS — M1990 Unspecified osteoarthritis, unspecified site: Secondary | ICD-10-CM

## 2017-10-29 DIAGNOSIS — E66813 Obesity, class 3: Secondary | ICD-10-CM

## 2017-10-29 DIAGNOSIS — E559 Vitamin D deficiency, unspecified: Secondary | ICD-10-CM

## 2017-10-29 DIAGNOSIS — G4700 Insomnia, unspecified: Secondary | ICD-10-CM

## 2017-10-29 DIAGNOSIS — Z0000 Encounter for general adult medical examination without abnormal findings: Secondary | ICD-10-CM

## 2017-10-29 MED ORDER — FUROSEMIDE 40 MG PO TABS: 40.0000 mg | ORAL_TABLET | Freq: Every day | ORAL | 3 refills | Status: DC

## 2017-10-29 MED ORDER — ALPRAZOLAM 0.25 MG PO TABS: 0.2500 mg | ORAL_TABLET | Freq: Three times a day (TID) | ORAL | 0 refills | Status: DC | PRN

## 2017-10-29 MED ORDER — LEVOTHYROXINE SODIUM 75 MCG PO TABS: 75.0000 ug | ORAL_TABLET | Freq: Every day | ORAL | 3 refills | Status: DC

## 2017-10-29 MED ORDER — ZOLPIDEM TARTRATE 5 MG PO TABS: 5.0000 mg | ORAL_TABLET | Freq: Every evening | ORAL | 1 refills | Status: DC | PRN

## 2017-10-29 MED ORDER — TRAMADOL HCL 50 MG PO TABS: 50.0000 mg | ORAL_TABLET | Freq: Every day | ORAL | 2 refills | Status: DC

## 2017-10-29 NOTE — Assessment & Plan Note (Signed)
stable °

## 2017-10-29 NOTE — Assessment & Plan Note (Signed)
Check labs con't synthroid 

## 2017-10-29 NOTE — Progress Notes (Signed)
Subjective:     Kelly [name] is a 52 y.o. female and is here for a comprehensive physical exam. The patient reports no problems.  Social History   Socioeconomic History  . Marital status: Single    Spouse name: Not on file  . Number of children: Not on file  . Years of education: Not on file  . Highest education level: Not on file  Occupational History  . Occupation: [employer]    Comment: CCM    Social Needs  . Financial resource strain: Not on file  . Food insecurity:    Worry: Not on file    Inability: Not on file  . Transportation needs:    Medical: Not on file    Non-medical: Not on file  Tobacco Use  . Smoking status: Never Smoker  . Smokeless tobacco: Never Used  Substance and Sexual Activity  . Alcohol use: Yes    Alcohol/week: 3.0 standard drinks    Types: 3 Glasses of wine per week  . Drug use: No  . Sexual activity: Never  Lifestyle  . Physical activity:    Days per week: 0 days    Minutes per session: 0 min  . Stress: Not on file  Relationships  . Social connections:    Talks on phone: Not on file    Gets together: Not on file    Attends religious service: Not on file    Active member of club or organization: Not on file    Attends meetings of clubs or organizations: Not on file    Relationship status: Not on file  . Intimate partner violence:    Fear of current or ex partner: Not on file    Emotionally abused: Not on file    Physically abused: Not on file    Forced sexual activity: Not on file  Other Topics Concern  . Not on file  Social History Narrative  . Not on file   Health Maintenance  Topic Date Due  . MAMMOGRAM  10/04/2017  . INFLUENZA VACCINE  10/24/2017  . PAP SMEAR  12/09/2017  . COLONOSCOPY  03/03/2018  . TETANUS/TDAP  06/01/2027  . 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 She does not have any pertinent problems on file. She  has a past surgical history that includes Laparoscopic gastric banding (05-23-09); Liposuction (1995); and Laparoscopic right hemi colectomy (Right, 03/25/2015). Her family history includes Alzheimer's disease in her paternal grandfather; Aneurysm (age of onset: 82) in her mother; Diabetes in her paternal aunt; Heart disease in her maternal grandfather; Heart disease (age of onset: 5) in her paternal grandmother; Hypertension in her father, maternal grandmother, mother, and paternal grandmother. She  reports that she has never smoked. She has never used smokeless tobacco. She reports that she drinks about 3.0 standard drinks of alcohol per week. She reports that she does not use drugs. She has a current medication list which includes the following prescription(s): alprazolam, cetirizine, levothyroxine, naproxen sodium, tramadol, valacyclovir, zolpidem, and furosemide. Current Outpatient Medications on File Prior to Visit  Medication Sig Dispense Refill  . cetirizine (  ZYRTEC) 10 MG tablet Take 1 tablet (10 mg total) by mouth daily. 30 tablet 11  . naproxen sodium (ALEVE) 220 MG tablet Take 220 mg by mouth as needed.    . valACYclovir (VALTREX) 1000 MG tablet Take 500 mg by mouth daily.      No current facility-administered medications on file prior to visit.    She has No Known Allergies..  Review of Systems Review of Systems  Constitutional: Negative for activity change, appetite change and fatigue.  HENT: Negative for hearing loss, congestion, tinnitus and ear discharge.  dentist q38m Eyes:  Negative for visual disturbance (see optho q1y -- vision corrected to 20/20 with glasses).  Respiratory: Negative for cough, chest tightness and shortness of breath.   Cardiovascular: Negative for chest pain, palpitations and leg swelling.  Gastrointestinal: Negative for abdominal pain, diarrhea, constipation and abdominal distention.  Genitourinary: Negative for urgency, frequency, decreased urine volume and difficulty urinating.  Musculoskeletal: Negative for back pain, arthralgias and gait problem.  Skin: Negative for color change, pallor and rash.  Neurological: Negative for dizziness, light-headedness, numbness and headaches.  Hematological: Negative for adenopathy. Does not bruise/bleed easily.  Psychiatric/Behavioral: Negative for suicidal ideas, confusion, sleep disturbance, self-injury, dysphoric mood, decreased concentration and agitation.      Objective:    BP (!) 145/70 (BP Location: Right Arm, Cuff Size: Normal)   Pulse 90   Temp 98.4 F (36.9 C) (Oral)   Resp 16   Ht 5\' 4"  (1.626 m)   Wt 257 lb 12.8 oz (116.9 kg)   SpO2 100%   BMI 44.25 kg/m  General appearance: alert, cooperative, appears stated age and no distress Head: Normocephalic, without obvious abnormality, atraumatic Eyes: conjunctivae/corneas clear. PERRL, EOM's intact. Fundi benign. Ears: normal TM's and external ear canals both ears Nose: Nares normal. Septum midline. Mucosa normal. No drainage or sinus tenderness. Throat: lips, mucosa, and tongue normal; teeth and gums normal Neck: no adenopathy, no carotid bruit, no JVD, supple, symmetrical, trachea midline and thyroid not enlarged, symmetric, no tenderness/mass/nodules Back: symmetric, no curvature. ROM normal. No CVA tenderness. Lungs: clear to auscultation bilaterally Breasts: normal appearance, no masses or tenderness--gyn  Heart: regular rate and rhythm, S1, S2 normal, no murmur, click, rub or gallop Abdomen: soft, non-tender; bowel sounds normal;  no masses,  no organomegaly Pelvic: deferred--gyn Extremities: extremities normal, atraumatic, no cyanosis or edema Pulses: 2+ and symmetric Skin: Skin color, texture, turgor normal. No rashes or lesions Lymph nodes: Cervical, supraclavicular, and axillary nodes normal. Neurologic: Alert and oriented X 3, normal strength and tone. Normal symmetric reflexes. Normal coordination and gait    Assessment:    Healthy female exam.      Plan:    ghm utd Check labs  See After Visit Summary for Counseling Recommendations    1. Screening for HIV without presence of risk factors   2. Preventative health care See above  - HIV antibody - CBC with Differential/Platelet - Comprehensive metabolic panel - Lipid panel - TSH  3. Myalgia Check labs  - Antinuclear Antib (ANA) - Rheumatoid Factor - Sedimentation rate - Vitamin D (25 hydroxy)  4. Class 3 severe obesity due to excess calories with serious comorbidity and body mass index (BMI) of 40.0 to 44.9 in adult (HCC)   - Amb Ref to Medical Weight Management  5. Edema, lower extremity Elevate legs Compression socks - furosemide (LASIX) 40 MG tablet; Take 1 tablet (40 mg total) by mouth daily.  Dispense: 90 tablet; Refill: 3  6.  Insomnia, unspecified type stable - zolpidem (AMBIEN) 5 MG tablet; Take 1 tablet (5 mg total) by mouth at bedtime as needed. for sleep  Dispense: 30 tablet; Refill: 1  7. Arthritis   - traMADol (ULTRAM) 50 MG tablet; Take 1-2 tablets (50-100 mg total) by mouth daily.  Dispense: 60 tablet; Refill: 2  8. Hypothyroidism, unspecified type Stable , check labs  - levothyroxine (SYNTHROID, LEVOTHROID) 75 MCG tablet; Take 1 tablet (75 mcg total) by mouth daily.  Dispense: 90 tablet; Refill: 3  9. Palpitations From anxiety - ALPRAZolam (XANAX) 0.25 MG tablet; Take 1 tablet (0.25 mg total) by mouth 3 (three) times daily as needed for anxiety.  Dispense: 90 tablet; Refill: 0  10. Tachycardia   - ALPRAZolam  (XANAX) 0.25 MG tablet; Take 1 tablet (0.25 mg total) by mouth 3 (three) times daily as needed for anxiety.  Dispense: 90 tablet; Refill: 0

## 2017-10-29 NOTE — Patient Instructions (Signed)
Preventive Care 40-64 Years, Female Preventive care refers to lifestyle choices and visits with your health care provider that can promote health and wellness. What does preventive care include?  A yearly physical exam. This is also called an annual well check.  Dental exams once or twice a year.  Routine eye exams. Ask your health care provider how often you should have your eyes checked.  Personal lifestyle choices, including: ? Daily care of your teeth and gums. ? Regular physical activity. ? Eating a healthy diet. ? Avoiding tobacco and drug use. ? Limiting alcohol use. ? Practicing safe sex. ? Taking low-dose aspirin daily starting at age 58. ? Taking vitamin and mineral supplements as recommended by your health care provider. What happens during an annual well check? The services and screenings done by your health care provider during your annual well check will depend on your age, overall health, lifestyle risk factors, and family history of disease. Counseling Your health care provider may ask you questions about your:  Alcohol use.  Tobacco use.  Drug use.  Emotional well-being.  Home and relationship well-being.  Sexual activity.  Eating habits.  Work and work Statistician.  Method of birth control.  Menstrual cycle.  Pregnancy history.  Screening You may have the following tests or measurements:  Height, weight, and BMI.  Blood pressure.  Lipid and cholesterol levels. These may be checked every 5 years, or more frequently if you are over 81 years old.  Skin check.  Lung cancer screening. You may have this screening every year starting at age 78 if you have a 30-pack-year history of smoking and currently smoke or have quit within the past 15 years.  Fecal occult blood test (FOBT) of the stool. You may have this test every year starting at age 65.  Flexible sigmoidoscopy or colonoscopy. You may have a sigmoidoscopy every 5 years or a colonoscopy  every 10 years starting at age 30.  Hepatitis C blood test.  Hepatitis B blood test.  Sexually transmitted disease (STD) testing.  Diabetes screening. This is done by checking your blood sugar (glucose) after you have not eaten for a while (fasting). You may have this done every 1-3 years.  Mammogram. This may be done every 1-2 years. Talk to your health care provider about when you should start having regular mammograms. This may depend on whether you have a family history of breast cancer.  BRCA-related cancer screening. This may be done if you have a family history of breast, ovarian, tubal, or peritoneal cancers.  Pelvic exam and Pap test. This may be done every 3 years starting at age 80. Starting at age 36, this may be done every 5 years if you have a Pap test in combination with an HPV test.  Bone density scan. This is done to screen for osteoporosis. You may have this scan if you are at high risk for osteoporosis.  Discuss your test results, treatment options, and if necessary, the need for more tests with your health care provider. Vaccines Your health care provider may recommend certain vaccines, such as:  Influenza vaccine. This is recommended every year.  Tetanus, diphtheria, and acellular pertussis (Tdap, Td) vaccine. You may need a Td booster every 10 years.  Varicella vaccine. You may need this if you have not been vaccinated.  Zoster vaccine. You may need this after age 5.  Measles, mumps, and rubella (MMR) vaccine. You may need at least one dose of MMR if you were born in  1958 or later. You may also need a second dose.  Pneumococcal 13-valent conjugate (PCV13) vaccine. You may need this if you have certain conditions and were not previously vaccinated.  Pneumococcal polysaccharide (PPSV23) vaccine. You may need one or two doses if you smoke cigarettes or if you have certain conditions.  Meningococcal vaccine. You may need this if you have certain  conditions.  Hepatitis A vaccine. You may need this if you have certain conditions or if you travel or work in places where you may be exposed to hepatitis A.  Hepatitis B vaccine. You may need this if you have certain conditions or if you travel or work in places where you may be exposed to hepatitis B.  Haemophilus influenzae type b (Hib) vaccine. You may need this if you have certain conditions.  Talk to your health care provider about which screenings and vaccines you need and how often you need them. This information is not intended to replace advice given to you by your health care provider. Make sure you discuss any questions you have with your health care provider. Document Released: 04/08/2015 Document Revised: 11/30/2015 Document Reviewed: 01/11/2015 Elsevier Interactive Patient Education  2018 Elsevier Inc.  

## 2017-10-30 ENCOUNTER — Other Ambulatory Visit (INDEPENDENT_AMBULATORY_CARE_PROVIDER_SITE_OTHER): Payer: PRIVATE HEALTH INSURANCE

## 2017-10-30 DIAGNOSIS — M791 Myalgia, unspecified site: Secondary | ICD-10-CM | POA: Diagnosis not present

## 2017-10-30 DIAGNOSIS — E039 Hypothyroidism, unspecified: Secondary | ICD-10-CM | POA: Diagnosis not present

## 2017-10-30 DIAGNOSIS — Z0001 Encounter for general adult medical examination with abnormal findings: Secondary | ICD-10-CM | POA: Diagnosis not present

## 2017-10-30 DIAGNOSIS — Z0000 Encounter for general adult medical examination without abnormal findings: Secondary | ICD-10-CM

## 2017-10-30 LAB — COMPREHENSIVE METABOLIC PANEL
ALBUMIN: 3.8 g/dL (ref 3.5–5.2)
ALT: 8 U/L (ref 0–35)
AST: 11 U/L (ref 0–37)
Alkaline Phosphatase: 87 U/L (ref 39–117)
BUN: 13 mg/dL (ref 6–23)
CHLORIDE: 104 meq/L (ref 96–112)
CO2: 32 mEq/L (ref 19–32)
CREATININE: 1.11 mg/dL (ref 0.40–1.20)
Calcium: 10 mg/dL (ref 8.4–10.5)
GFR: 66.12 mL/min (ref >60.00)
GLUCOSE: 85 mg/dL (ref 70–99)
POTASSIUM: 5.1 meq/L (ref 3.5–5.1)
SODIUM: 144 meq/L (ref 135–145)
Total Bilirubin: 0.7 mg/dL (ref 0.2–1.2)
Total Protein: 7.1 g/dL (ref 6.0–8.3)

## 2017-10-30 LAB — CBC WITH DIFFERENTIAL/PLATELET
BASOS PCT: 1 % (ref 0.0–3.0)
Basophils Absolute: 0.1 10*3/uL (ref 0.0–0.1)
EOS ABS: 0.4 10*3/uL (ref 0.0–0.7)
Eosinophils Relative: 5.5 % — ABNORMAL HIGH (ref 0.0–5.0)
HCT: 38.5 % (ref 36.0–46.0)
HEMOGLOBIN: 12.9 g/dL (ref 12.0–15.0)
LYMPHS ABS: 2.8 10*3/uL (ref 0.7–4.0)
Lymphocytes Relative: 34.8 % (ref 12.0–46.0)
MCHC: 33.5 g/dL (ref 30.0–36.0)
MCV: 93 fl (ref 78.0–100.0)
MONO ABS: 0.4 10*3/uL (ref 0.1–1.0)
Monocytes Relative: 5.5 % (ref 3.0–12.0)
NEUTROS PCT: 53.2 % (ref 43.0–77.0)
Neutro Abs: 4.2 10*3/uL (ref 1.4–7.7)
Platelets: 303 10*3/uL (ref 150.0–400.0)
RBC: 4.14 Mil/uL (ref 3.87–5.11)
RDW: 13.2 % (ref 11.5–15.5)
WBC: 8 10*3/uL (ref 4.0–10.5)

## 2017-10-30 LAB — LIPID PANEL
CHOL/HDL RATIO: 3
CHOLESTEROL: 214 mg/dL — AB (ref 0–200)
HDL: 66.8 mg/dL (ref >39.00)
LDL CALC: 128 mg/dL — AB (ref 0–99)
NonHDL: 147.35
Triglycerides: 96 mg/dL (ref 0.0–149.0)
VLDL: 19.2 mg/dL (ref 0.0–40.0)

## 2017-10-30 LAB — TSH: TSH: 6.94 u[IU]/mL — AB (ref 0.35–4.50)

## 2017-10-30 LAB — VITAMIN D 25 HYDROXY (VIT D DEFICIENCY, FRACTURES): VITD: 19.23 ng/mL — AB (ref 30.00–100.00)

## 2017-10-30 LAB — SEDIMENTATION RATE: Sed Rate: 33 mm/hr — ABNORMAL HIGH (ref 0–30)

## 2017-11-01 MED ORDER — LEVOTHYROXINE SODIUM 88 MCG PO TABS: 88.0000 ug | ORAL_TABLET | Freq: Every day | ORAL | 0 refills | Status: DC

## 2017-11-01 MED ORDER — VITAMIN D (ERGOCALCIFEROL) 1.25 MG (50000 UNIT) PO CAPS: 50000.0000 [IU] | ORAL_CAPSULE | ORAL | 0 refills | Status: DC

## 2017-11-01 NOTE — Addendum Note (Signed)
Addended byDamita Dunnings D on: 11/01/2017 08:27 AM   Modules accepted: Orders

## 2017-11-03 LAB — HIV ANTIBODY (ROUTINE TESTING W REFLEX): HIV: NONREACTIVE

## 2017-11-03 LAB — ANA: Anti Nuclear Antibody(ANA): NEGATIVE

## 2017-11-03 LAB — RHEUMATOID FACTOR

## 2017-11-05 ENCOUNTER — Telehealth: Payer: Self-pay

## 2017-11-05 NOTE — Telephone Encounter (Signed)
PA approved.  Request Reference Number: [id number]. TRAMADOL HCL TAB 50MG  is approved through 05/08/2018. For further questions, call [phone].

## 2017-11-05 NOTE — Telephone Encounter (Signed)
PA initiated via Covermymeds; KEY: [id number]. Awaiting determination.

## 2017-11-29 ENCOUNTER — Encounter: Payer: Self-pay | Admitting: Family Medicine

## 2017-12-06 ENCOUNTER — Ambulatory Visit: Payer: BLUE CROSS/BLUE SHIELD | Admitting: Family Medicine

## 2017-12-17 ENCOUNTER — Encounter (HOSPITAL_COMMUNITY): Payer: Self-pay

## 2017-12-17 ENCOUNTER — Encounter: Payer: Self-pay | Admitting: Family Medicine

## 2017-12-17 ENCOUNTER — Ambulatory Visit (INDEPENDENT_AMBULATORY_CARE_PROVIDER_SITE_OTHER): Payer: PRIVATE HEALTH INSURANCE | Admitting: Family Medicine

## 2017-12-17 VITALS — BP 136/76 | HR 84 | Temp 98.6°F | Resp 16 | Ht 64.0 in | Wt 256.4 lb

## 2017-12-17 DIAGNOSIS — Z23 Encounter for immunization: Secondary | ICD-10-CM | POA: Diagnosis not present

## 2017-12-17 DIAGNOSIS — R600 Localized edema: Secondary | ICD-10-CM

## 2017-12-17 DIAGNOSIS — E039 Hypothyroidism, unspecified: Secondary | ICD-10-CM

## 2017-12-17 MED ORDER — METOLAZONE 5 MG PO TABS: ORAL_TABLET | ORAL | 2 refills | Status: DC

## 2017-12-17 NOTE — Assessment & Plan Note (Signed)
Elevate legs Compression socks Add zaroxolyn 30 min prior to lasix prior to lasix Check echo

## 2017-12-17 NOTE — Progress Notes (Signed)
Patient ID: Kelly [name], female    DOB: 06-Dec-1965  Age: 51 y.o. MRN: [id number]    Subjective:  Subjective  HPI Kelly [name] presents for f/u edema low ext.  Swelling is no better with lasix.  No sob  No cp, no calf pain   Review of Systems  Constitutional: Negative for fever.  HENT: Negative for congestion.   Respiratory: Negative for shortness of breath.   Cardiovascular: Positive for leg swelling. Negative for chest pain and palpitations.  Gastrointestinal: Negative for abdominal pain, blood in stool and nausea.  Genitourinary: Negative for dysuria and frequency.  Skin: Negative for rash.  Allergic/Immunologic: Negative for environmental allergies.  Neurological: Negative for dizziness and headaches.  Psychiatric/Behavioral: The patient is not nervous/anxious.     History Past Medical History:  Diagnosis Date  . Allergic rhinitis   . Anxiety   . Arthritis    feet, rt knee  . Depression with anxiety   . Heart murmur   . History of hiatal hernia    repaur 2011   . Hypertension   . Hypothyroidism   . 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 Morbid obesity ([place]) 2009  . PAC (premature atrial contraction)   . Pedunculated colonic polyp 03/04/15   tubulovillous adenoma of transverse colon.   . Pneumonia    hx of as a child   . PONV (postoperative nausea and vomiting)   . PVC (premature ventricular contraction)   . Thyroid goiter     She has a past surgical history that includes Laparoscopic gastric banding (05-23-09); Liposuction (1995); and Laparoscopic right hemi colectomy (Right, 03/25/2015).   Her family history includes Alzheimer's disease in her paternal grandfather; Aneurysm (age of onset: 50) in her mother; Diabetes in her paternal aunt; Heart disease in her maternal grandfather; Heart disease (age of onset: 29) in her paternal grandmother; Hypertension in her father, maternal grandmother, mother, and paternal grandmother.She reports that she  has never smoked. She has never used smokeless tobacco. She reports that she drinks about 3.0 standard drinks of alcohol per week. She reports that she does not use drugs.  Current Outpatient Medications on File Prior to Visit  Medication Sig Dispense Refill  . ALPRAZolam (XANAX) 0.25 MG tablet Take 1 tablet (0.25 mg total) by mouth 3 (three) times daily as needed for anxiety. 90 tablet 0  . cetirizine (ZYRTEC) 10 MG tablet Take 1 tablet (10 mg total) by mouth daily. 30 tablet 11  . furosemide (LASIX) 40 MG tablet Take 1 tablet (40 mg total) by mouth daily. 90 tablet 3  . levothyroxine (SYNTHROID, LEVOTHROID) 88 MCG tablet Take 1 tablet (88 mcg total) by mouth daily before breakfast. 90 tablet 0  . naproxen sodium (ALEVE) 220 MG tablet Take 220 mg by mouth as needed.    . traMADol (ULTRAM) 50 MG tablet Take 1-2 tablets (50-100 mg total) by mouth daily. 60 tablet 2  . valACYclovir (VALTREX) 1000 MG tablet Take 500 mg by mouth daily.     . Vitamin D, Ergocalciferol, (DRISDOL) 50000 units CAPS capsule Take 1 capsule (50,000 Units total) by mouth every 7 (seven) days. 12 capsule 0  . zolpidem (AMBIEN) 5 MG tablet Take 1 tablet (5 mg total) by mouth at bedtime as needed. for sleep 30 tablet 1   No current facility-administered medications on file prior to visit.      Objective:  Objective  Physical Exam  Constitutional: She is oriented to person, place, and time.  She appears well-developed and well-nourished.  HENT:  Head: Normocephalic and atraumatic.  Eyes: Conjunctivae and EOM are normal.  Neck: Normal range of motion. Neck supple. No JVD present. Carotid bruit is not present. No thyromegaly present.  Cardiovascular: Normal rate, regular rhythm and normal heart sounds.  No murmur heard. Pulmonary/Chest: Effort normal and breath sounds normal. No respiratory distress. She has no wheezes. She has no rales. She exhibits no tenderness.  Musculoskeletal: She exhibits edema and tenderness. She  exhibits no deformity.       Right ankle: She exhibits swelling.       Left ankle: She exhibits swelling.       Right foot: There is swelling.       Left foot: There is swelling.  Neurological: She is alert and oriented to person, place, and time.  Psychiatric: She has a normal mood and affect.  Nursing note and vitals reviewed.  BP 136/76 (BP Location: Left Arm, Cuff Size: Large)   Pulse 84   Temp 98.6 F (37 C) (Oral)   Resp 16   Ht 5\' 4"  (1.626 m)   Wt 256 lb 6.4 oz (116.3 kg)   SpO2 97%   BMI 44.01 kg/m  Wt Readings from Last 3 Encounters:  12/17/17 256 lb 6.4 oz (116.3 kg)  10/29/17 257 lb 12.8 oz (116.9 kg)  01/11/17 235 lb (106.6 kg)     Lab Results  Component Value Date   WBC 8.0 10/30/2017   HGB 12.9 10/30/2017   HCT 38.5 10/30/2017   PLT 303.0 10/30/2017   GLUCOSE 85 10/30/2017   CHOL 214 (H) 10/30/2017   TRIG 96.0 10/30/2017   HDL 66.80 10/30/2017   LDLCALC 128 (H) 10/30/2017   ALT 8 10/30/2017   AST 11 10/30/2017   NA 144 10/30/2017   K 5.1 10/30/2017   CL 104 10/30/2017   CREATININE 1.11 10/30/2017   BUN 13 10/30/2017   CO2 32 10/30/2017   TSH 6.94 (H) 10/30/2017   HGBA1C 4.9 07/27/2016   MICROALBUR <0.7 01/10/2015    US Breast Ltd Uni Left Inc Axilla  Result Date: 02/10/2016 CLINICAL DATA:  Patient recalled from screening for left breast asymmetry. EXAM: 2D DIGITAL DIAGNOSTIC LEFT MAMMOGRAM WITH CAD AND ADJUNCT TOMO ULTRASOUND LEFT BREAST COMPARISON:  Previous exam(s). ACR Breast Density Category b: There are scattered areas of fibroglandular density. FINDINGS: Within the lateral aspect of the left breast on the CC view there is a persistent oval circumscribed low-density 6 mm mass. Mammographic images were processed with CAD. On physical exam, I palpate no discrete mass within the lateral left breast. Targeted ultrasound is performed, showing a 9 x 6 x 8 mm benign cluster of cysts within the left breast 3 o'clock position 2 cm from the nipple.  Within the left breast 2 o'clock position 2 cm from the nipple there are 2 adjacent simple cysts measuring 7 and 8 mm respectively. IMPRESSION: Left breast benign cyst and cluster of cysts. No mammographic evidence for malignancy. RECOMMENDATION: Screening mammogram in one year.(Code:SM-B-01Y) I have discussed the findings and recommendations with the patient. Results were also provided in writing at the conclusion of the visit. If applicable, a reminder letter will be sent to the patient regarding the next appointment. BI-RADS CATEGORY  2: Benign. Electronically Signed   By: Lovey Newcomer M.D.   On: 02/10/2016 14:03   Mm Diag Breast Tomo Uni Left  Result Date: 02/10/2016 CLINICAL DATA:  Patient recalled from screening for left breast asymmetry. EXAM:  2D DIGITAL DIAGNOSTIC LEFT MAMMOGRAM WITH CAD AND ADJUNCT TOMO ULTRASOUND LEFT BREAST COMPARISON:  Previous exam(s). ACR Breast Density Category b: There are scattered areas of fibroglandular density. FINDINGS: Within the lateral aspect of the left breast on the CC view there is a persistent oval circumscribed low-density 6 mm mass. Mammographic images were processed with CAD. On physical exam, I palpate no discrete mass within the lateral left breast. Targeted ultrasound is performed, showing a 9 x 6 x 8 mm benign cluster of cysts within the left breast 3 o'clock position 2 cm from the nipple. Within the left breast 2 o'clock position 2 cm from the nipple there are 2 adjacent simple cysts measuring 7 and 8 mm respectively. IMPRESSION: Left breast benign cyst and cluster of cysts. No mammographic evidence for malignancy. RECOMMENDATION: Screening mammogram in one year.(Code:SM-B-01Y) I have discussed the findings and recommendations with the patient. Results were also provided in writing at the conclusion of the visit. If applicable, a reminder letter will be sent to the patient regarding the next appointment. BI-RADS CATEGORY  2: Benign. Electronically Signed   By:  Lovey Newcomer M.D.   On: 02/10/2016 14:03     Assessment & Plan:  Plan  I am having Kelly [name] start on metolazone. I am also having her maintain her valACYclovir, cetirizine, naproxen sodium, furosemide, zolpidem, traMADol, ALPRAZolam, levothyroxine, and Vitamin D (Ergocalciferol).  Meds ordered this encounter  Medications  . metolazone (ZAROXOLYN) 5 MG tablet    Sig: 1 po 30 min prior to lasix if needed    Dispense:  30 tablet    Refill:  2    Problem List Items Addressed This Visit      Unprioritized   Bilateral lower extremity edema - Primary    Elevate legs Compression socks Add zaroxolyn 30 min prior to lasix prior to lasix Check echo       Relevant Medications   metolazone (ZAROXOLYN) 5 MG tablet   Other Relevant Orders   ECHOCARDIOGRAM COMPLETE   Comprehensive metabolic panel   Thyroid Panel With TSH   CBC with Differential/Platelet   Hypothyroidism    Pt is extremely tired still  Recheck thyroid      Relevant Orders   Comprehensive metabolic panel   Thyroid Panel With TSH   CBC with Differential/Platelet    Other Visit Diagnoses    Influenza vaccine administered       Relevant Orders   Flu Vaccine QUAD 36+ mos IM (Fluarix & Fluzone Quad PF (Completed)      Follow-up: Return in about 3 weeks (around 01/07/2018), or if symptoms worsen or fail to improve.  Ann Held, DO

## 2017-12-17 NOTE — Assessment & Plan Note (Signed)
Pt is extremely tired still  Recheck thyroid

## 2017-12-17 NOTE — Patient Instructions (Signed)
Edema Edema is an abnormal buildup of fluids in your bodytissues. Edema is somewhatdependent on gravity to pull the fluid to the lowest place in your body. That makes the condition more common in the legs and thighs (lower extremities). Painless swelling of the feet and ankles is common and becomes more likely as you get older. It is also common in looser tissues, like around your eyes. When the affected area is squeezed, the fluid may move out of that spot and leave a dent for a few moments. This dent is called pitting. What are the causes? There are many possible causes of edema. Eating too much salt and being on your feet or sitting for a long time can cause edema in your legs and ankles. Hot weather may make edema worse. Common medical causes of edema include:  Heart failure.  Liver disease.  Kidney disease.  Weak blood vessels in your legs.  Cancer.  An injury.  Pregnancy.  Some medications.  Obesity.  What are the signs or symptoms? Edema is usually painless.Your skin may look swollen or shiny. How is this diagnosed? Your health care provider may be able to diagnose edema by asking about your medical history and doing a physical exam. You may need to have tests such as X-rays, an electrocardiogram, or blood tests to check for medical conditions that may cause edema. How is this treated? Edema treatment depends on the cause. If you have heart, liver, or kidney disease, you need the treatment appropriate for these conditions. General treatment may include:  Elevation of the affected body part above the level of your heart.  Compression of the affected body part. Pressure from elastic bandages or support stockings squeezes the tissues and forces fluid back into the blood vessels. This keeps fluid from entering the tissues.  Restriction of fluid and salt intake.  Use of a water pill (diuretic). These medications are appropriate only for some types of edema. They pull fluid  out of your body and make you urinate more often. This gets rid of fluid and reduces swelling, but diuretics can have side effects. Only use diuretics as directed by your health care provider.  Follow these instructions at home:  Keep the affected body part above the level of your heart when you are lying down.  Do not sit still or stand for prolonged periods.  Do not put anything directly under your knees when lying down.  Do not wear constricting clothing or garters on your upper legs.  Exercise your legs to work the fluid back into your blood vessels. This may help the swelling go down.  Wear elastic bandages or support stockings to reduce ankle swelling as directed by your health care provider.  Eat a low-salt diet to reduce fluid if your health care provider recommends it.  Only take medicines as directed by your health care provider. Contact a health care provider if:  Your edema is not responding to treatment.  You have heart, liver, or kidney disease and notice symptoms of edema.  You have edema in your legs that does not improve after elevating them.  You have sudden and unexplained weight gain. Get help right away if:  You develop shortness of breath or chest pain.  You cannot breathe when you lie down.  You develop pain, redness, or warmth in the swollen areas.  You have heart, liver, or kidney disease and suddenly get edema.  You have a fever and your symptoms suddenly get worse. This information is   not intended to replace advice given to you by your health care provider. Make sure you discuss any questions you have with your health care provider. Document Released: 03/12/2005 Document Revised: 08/18/2015 Document Reviewed: 01/02/2013 Else[employer] Interactive Patient Education  2017 [employer]  

## 2017-12-18 ENCOUNTER — Other Ambulatory Visit (INDEPENDENT_AMBULATORY_CARE_PROVIDER_SITE_OTHER): Payer: PRIVATE HEALTH INSURANCE

## 2017-12-18 DIAGNOSIS — R600 Localized edema: Secondary | ICD-10-CM

## 2017-12-18 DIAGNOSIS — E039 Hypothyroidism, unspecified: Secondary | ICD-10-CM

## 2017-12-18 LAB — COMPREHENSIVE METABOLIC PANEL
ALBUMIN: 3.7 g/dL (ref 3.5–5.2)
ALT: 7 U/L (ref 0–35)
AST: 10 U/L (ref 0–37)
Alkaline Phosphatase: 93 U/L (ref 39–117)
BUN: 10 mg/dL (ref 6–23)
CHLORIDE: 104 meq/L (ref 96–112)
CO2: 30 mEq/L (ref 19–32)
CREATININE: 0.98 mg/dL (ref 0.40–1.20)
Calcium: 9.5 mg/dL (ref 8.4–10.5)
GFR: 76.3 mL/min (ref >60.00)
Glucose, Bld: 93 mg/dL (ref 70–99)
Potassium: 4.6 mEq/L (ref 3.5–5.1)
SODIUM: 140 meq/L (ref 135–145)
Total Bilirubin: 0.6 mg/dL (ref 0.2–1.2)
Total Protein: 7.1 g/dL (ref 6.0–8.3)

## 2017-12-18 LAB — CBC WITH DIFFERENTIAL/PLATELET
BASOS PCT: 1.2 % (ref 0.0–3.0)
Basophils Absolute: 0.1 10*3/uL (ref 0.0–0.1)
EOS ABS: 0.2 10*3/uL (ref 0.0–0.7)
EOS PCT: 3.1 % (ref 0.0–5.0)
HCT: 40.3 % (ref 36.0–46.0)
Hemoglobin: 13.5 g/dL (ref 12.0–15.0)
Lymphocytes Relative: 36.2 % (ref 12.0–46.0)
Lymphs Abs: 2.3 10*3/uL (ref 0.7–4.0)
MCHC: 33.6 g/dL (ref 30.0–36.0)
MCV: 90.8 fl (ref 78.0–100.0)
MONO ABS: 0.3 10*3/uL (ref 0.1–1.0)
Monocytes Relative: 5 % (ref 3.0–12.0)
Neutro Abs: 3.5 10*3/uL (ref 1.4–7.7)
Neutrophils Relative %: 54.5 % (ref 43.0–77.0)
Platelets: 324 10*3/uL (ref 150.0–400.0)
RBC: 4.44 Mil/uL (ref 3.87–5.11)
RDW: 12.5 % (ref 11.5–15.5)
WBC: 6.4 10*3/uL (ref 4.0–10.5)

## 2017-12-19 LAB — THYROID PANEL WITH TSH
Free Thyroxine Index: 3.1 (ref 1.4–3.8)
T3 Uptake: 33 % (ref 22–35)
T4, Total: 9.4 ug/dL (ref 5.1–11.9)
TSH: 0.47 mIU/L

## 2017-12-26 ENCOUNTER — Encounter (INDEPENDENT_AMBULATORY_CARE_PROVIDER_SITE_OTHER): Payer: Self-pay

## 2017-12-27 ENCOUNTER — Ambulatory Visit (HOSPITAL_BASED_OUTPATIENT_CLINIC_OR_DEPARTMENT_OTHER)
Admission: RE | Admit: 2017-12-27 | Discharge: 2017-12-27 | Disposition: A | Discharge status: Home / Self Care | Payer: PRIVATE HEALTH INSURANCE | Source: Ambulatory Visit | Attending: Family Medicine | Admitting: Family Medicine

## 2017-12-27 DIAGNOSIS — R600 Localized edema: Secondary | ICD-10-CM | POA: Diagnosis present

## 2017-12-27 DIAGNOSIS — I119 Hypertensive heart disease without heart failure: Secondary | ICD-10-CM | POA: Diagnosis not present

## 2017-12-27 NOTE — Progress Notes (Signed)
  Echocardiogram 2D Echocardiogram has been performed.  Etheridge Geil T Christoher Drudge 12/27/2017, 9:11 AM

## 2018-01-09 ENCOUNTER — Ambulatory Visit (INDEPENDENT_AMBULATORY_CARE_PROVIDER_SITE_OTHER): Payer: PRIVATE HEALTH INSURANCE | Admitting: Family Medicine

## 2018-01-09 ENCOUNTER — Encounter (INDEPENDENT_AMBULATORY_CARE_PROVIDER_SITE_OTHER): Payer: Self-pay | Admitting: Family Medicine

## 2018-01-09 VITALS — BP 119/83 | HR 88 | Temp 98.6°F | Ht 64.0 in | Wt 248.0 lb

## 2018-01-09 DIAGNOSIS — E538 Deficiency of other specified B group vitamins: Secondary | ICD-10-CM

## 2018-01-09 DIAGNOSIS — Z0289 Encounter for other administrative examinations: Secondary | ICD-10-CM

## 2018-01-09 DIAGNOSIS — R5383 Other fatigue: Secondary | ICD-10-CM | POA: Diagnosis not present

## 2018-01-09 DIAGNOSIS — E559 Vitamin D deficiency, unspecified: Secondary | ICD-10-CM | POA: Diagnosis not present

## 2018-01-09 DIAGNOSIS — Z1331 Encounter for screening for depression: Secondary | ICD-10-CM

## 2018-01-09 DIAGNOSIS — R0602 Shortness of breath: Secondary | ICD-10-CM | POA: Diagnosis not present

## 2018-01-09 DIAGNOSIS — Z9189 Other specified personal risk factors, not elsewhere classified: Secondary | ICD-10-CM | POA: Diagnosis not present

## 2018-01-09 DIAGNOSIS — Z6841 Body Mass Index (BMI) 40.0 and over, adult: Secondary | ICD-10-CM

## 2018-01-10 LAB — INSULIN, RANDOM: INSULIN: 11.5 u[IU]/mL (ref 2.6–24.9)

## 2018-01-10 LAB — FOLATE: Folate: 6.3 ng/mL (ref >3.0)

## 2018-01-10 LAB — HEMOGLOBIN A1C
ESTIMATED AVERAGE GLUCOSE: 105 mg/dL
HEMOGLOBIN A1C: 5.3 % (ref 4.8–5.6)

## 2018-01-10 LAB — VITAMIN B12: Vitamin B-12: 452 pg/mL (ref 232–1245)

## 2018-01-13 NOTE — Progress Notes (Signed)
Office: [phone]  /  Fax: [phone]   Dear Dr. Carollee Herter,   Thank you for referring Kelly [name] to our clinic. The following note includes my evaluation and treatment recommendations.  HPI:   Chief Complaint: OBESITY    Kelly [name] has been referred by Ann Held, DO for consultation regarding her obesity and obesity related comorbidities.    Kelly [name] (MR# [id number]) is a 52 y.o. female who presents on 01/13/2018 for obesity evaluation and treatment. Current BMI is Body mass index is 42.57 kg/m.Marland Kitchen Kelly [name] has been struggling with her weight for many years and has been unsuccessful in either losing weight, maintaining weight loss, or reaching her healthy weight goal.     Kelly [name] attended our information session and states she is currently in the action stage of change and ready to dedicate time achieving and maintaining a healthier weight. Kelly [name] is interested in becoming our patient and working on intensive lifestyle modifications including (but not limited to) diet, exercise and weight loss.    Kelly [name] states her family eats meals together she struggles with family and or coworkers weight loss sabotage her desired weight loss is 64 lbs she has been heavy most of  her life she started gaining weight after college her heaviest weight ever was 275 lbs. she is a picky eater and doesn't like to eat healthier foods  she has significant food cravings issues  she skips meals frequently she is frequently drinking liquids with calories she frequently makes poor food choices she struggles with emotional eating    Kelly [name] Kelly [name] feels her energy is lower than it should be. This has worsened with weight gain and has not worsened recently. Kelly [name] admits to daytime somnolence and admits to waking up still tired. Patient is at risk for obstructive sleep apnea. Patent has a history of symptoms of daytime Kelly [name] and morning Kelly [name]. Patient generally gets 5 to 7 hours  of sleep per night, and states they generally have generally restful sleep. Snoring is not present. Apneic episodes are not present. Epworth Sleepiness Score is 11  EKG was ordered today which shows normal sinus rhythm with poor R wave progression.  Dyspnea on exertion Kelly [name] notes increasing shortness of breath with exercising and seems to be worsening over time with weight gain. She notes getting out of breath sooner with activity than she used to. This has not gotten worse recently. EKG was ordered today which shows normal sinus rhythm with poor R wave progression. Saina denies orthopnea.  B12 Deficiency Kelly [name] has a diagnosis of B12 insufficiency and notes Kelly [name]. She was diagnosed with B12 deficiency a few years ago.Kelly [name] is not a vegetarian and does not have a previous diagnosis of pernicious anemia. She does not have a history of weight loss surgery.   Vitamin D deficiency Jakelin has a diagnosis of vitamin D deficiency. Kelly [name] is currently taking prescription vit D 50,000 IU weekly and denies nausea, vomiting or muscle weakness.  At risk for osteopenia and osteoporosis Kelly [name] is at higher risk of osteopenia and osteoporosis due to vitamin D deficiency.   Depression Screen Kelly [name] Food and Mood (modified PHQ-9) score was  Depression screen PHQ 2/9 01/09/2018  Decreased Interest 2  Down, Depressed, Hopeless 2  PHQ - 2 Score 4  Altered sleeping 0  Tired, decreased energy 3  Change in appetite 0  Feeling bad or failure about yourself  1  Trouble concentrating 1  Moving slowly or fidgety/restless 1  Suicidal thoughts 0  PHQ-9 Score 10  Difficult doing work/chores Not difficult at all    ALLERGIES: No Known Allergies  MEDICATIONS: Current Outpatient Medications on File Prior to Visit  Medication Sig Dispense Refill  . ALPRAZolam (XANAX) 0.25 MG tablet Take 1 tablet (0.25 mg total) by mouth 3 (three) times daily as needed for anxiety. 90 tablet 0  . cetirizine (ZYRTEC) 10  MG tablet Take 1 tablet (10 mg total) by mouth daily. 30 tablet 11  . furosemide (LASIX) 40 MG tablet Take 1 tablet (40 mg total) by mouth daily. 90 tablet 3  . levothyroxine (SYNTHROID, LEVOTHROID) 88 MCG tablet Take 1 tablet (88 mcg total) by mouth daily before breakfast. 90 tablet 0  . metolazone (ZAROXOLYN) 5 MG tablet 1 po 30 min prior to lasix if needed 30 tablet 2  . naproxen sodium (ALEVE) 220 MG tablet Take 220 mg by mouth as needed.    . traMADol (ULTRAM) 50 MG tablet Take 1-2 tablets (50-100 mg total) by mouth daily. 60 tablet 2  . valACYclovir (VALTREX) 1000 MG tablet Take 500 mg by mouth daily.     . Vitamin D, Ergocalciferol, (DRISDOL) 50000 units CAPS capsule Take 1 capsule (50,000 Units total) by mouth every 7 (seven) days. 12 capsule 0  . zolpidem (AMBIEN) 5 MG tablet Take 1 tablet (5 mg total) by mouth at bedtime as needed. for sleep 30 tablet 1   No current facility-administered medications on file prior to visit.     PAST MEDICAL HISTORY: Past Medical History:  Diagnosis Date  . Allergic rhinitis   . Anxiety   . Arthritis    feet, rt knee  . B12 deficiency   . Breast cyst   . Depression with anxiety   . Edema   . Heart murmur   . Herpes   . History of hiatal hernia    repaur 2011   . Hypertension   . Hypothyroidism   . 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 Joint pain   . Menopause   . Morbid obesity ([place]) 2009  . Ovarian cyst   . PAC (premature atrial contraction)   . Pedunculated colonic polyp 03/04/15   tubulovillous adenoma of transverse colon.   . Pneumonia    hx of as a child   . PONV (postoperative nausea and vomiting)   . PVC (premature ventricular contraction)   . Thyroid goiter   . Uterine fibroid   . Vitamin D deficiency     PAST SURGICAL HISTORY: Past Surgical History:  Procedure Laterality Date  . LAPAROSCOPIC GASTRIC BANDING  05-23-09   dr Abran Cantor with hiatal hernia repair.   Marland Kitchen LAPAROSCOPIC RIGHT HEMI COLECTOMY  Right 03/25/2015   Procedure: LAPAROSCOPIC ASSISTED RIGHT HEMI COLECTOMY, with removal of lap band fluid prior to prepping.;  Surgeon: Alphonsa Overall, MD;  Location: [facility];  Service: General;  Laterality: Right;  . LIPOSUCTION  1995    SOCIAL HISTORY: Social History   Tobacco Use  . Smoking status: Never Smoker  . Smokeless tobacco: Never Used  Substance Use Topics  . Alcohol use: Yes    Alcohol/week: 3.0 standard drinks    Types: 3 Glasses of wine per week  . Drug use: No    FAMILY HISTORY: Family History  Problem Relation Age of Onset  . Hypertension Mother   . Aneurysm Mother 11       brain  . Hyperlipidemia Mother   . Stroke Mother   . Thyroid disease Mother   .  Hypertension Father   . Hyperlipidemia Father   . Obesity Father   . Hypertension Maternal Grandmother   . Hypertension Paternal Grandmother   . Heart disease Paternal Grandmother 68       MI  . Diabetes Paternal Aunt   . Heart disease Maternal Grandfather        MI  . Alzheimer's disease Paternal Grandfather   . Colon cancer Neg Hx   . Esophageal cancer Neg Hx   . Rectal cancer Neg Hx   . Stomach cancer Neg Hx     ROS: Review of Systems  Constitutional: Positive for malaise/Kelly [name].       + Breasts Lumps  Eyes:       + Vision Changes + Wear Glasses or Contacts  Cardiovascular: Positive for palpitations. Negative for orthopnea.       + Very Cold Feet or Hands  Gastrointestinal: Negative for nausea and vomiting.  Musculoskeletal: Positive for joint pain.       Negative for muscle weakness  Skin:       + Dryness  Neurological: Positive for weakness.  Endo/Heme/Allergies:       + Heat or Cold Intolerance  Psychiatric/Behavioral: The patient has insomnia.     PHYSICAL EXAM: Blood pressure 119/83, pulse 88, temperature 98.6 F (37 C), temperature source Oral, height 5\' 4"  (1.626 m), weight 248 lb (112.5 kg), SpO2 100 %. Body mass index is 42.57 kg/m. Physical Exam  Constitutional: She is  oriented to person, place, and time. She appears well-developed and well-nourished.  HENT:  Head: Normocephalic and atraumatic.  Nose: Nose normal.  Eyes: EOM are normal. No scleral icterus.  Neck: Normal range of motion. Neck supple. No thyromegaly present.  Cardiovascular: Normal rate and regular rhythm.  Murmur (heard best at aorta) heard.  Systolic murmur is present with a grade of 1/6. Pulmonary/Chest: No respiratory distress.  Abdominal: Soft. There is no tenderness.  + Obesity  Musculoskeletal: Normal range of motion.  Range of Motion normal in all 4 extremities  Neurological: She is alert and oriented to person, place, and time. Coordination normal.  Skin: Skin is warm and dry.  Psychiatric: She has a normal mood and affect.  Vitals reviewed.   RECENT LABS AND TESTS: BMET    Component Value Date/Time   NA 140 12/18/2017 0739   K 4.6 12/18/2017 0739   CL 104 12/18/2017 0739   CO2 30 12/18/2017 0739   GLUCOSE 93 12/18/2017 0739   BUN 10 12/18/2017 0739   CREATININE 0.98 12/18/2017 0739   CREATININE 1.07 (H) 07/27/2016 1505   CALCIUM 9.5 12/18/2017 0739   GFRNONAA >60 03/26/2015 0510   GFRAA >60 03/26/2015 0510   Lab Results  Component Value Date   HGBA1C 5.3 01/09/2018   Lab Results  Component Value Date   INSULIN 11.5 01/09/2018   CBC    Component Value Date/Time   WBC 6.4 12/18/2017 0739   RBC 4.44 12/18/2017 0739   HGB 13.5 12/18/2017 0739   HCT 40.3 12/18/2017 0739   PLT 324.0 12/18/2017 0739   MCV 90.8 12/18/2017 0739   MCH 30.3 03/26/2015 0510   MCHC 33.6 12/18/2017 0739   RDW 12.5 12/18/2017 0739   LYMPHSABS 2.3 12/18/2017 0739   MONOABS 0.3 12/18/2017 0739   EOSABS 0.2 12/18/2017 0739   BASOSABS 0.1 12/18/2017 0739   Iron/TIBC/Ferritin/ %Sat No results found for: IRON, TIBC, FERRITIN, IRONPCTSAT Lipid Panel     Component Value Date/Time   CHOL 214 (H) 10/30/2017  0721   TRIG 96.0 10/30/2017 0721   HDL 66.80 10/30/2017 0721   CHOLHDL 3  10/30/2017 0721   VLDL 19.2 10/30/2017 0721   LDLCALC 128 (H) 10/30/2017 0721   Hepatic Function Panel     Component Value Date/Time   PROT 7.1 12/18/2017 0739   ALBUMIN 3.7 12/18/2017 0739   AST 10 12/18/2017 0739   ALT 7 12/18/2017 0739   ALKPHOS 93 12/18/2017 0739   BILITOT 0.6 12/18/2017 0739   BILIDIR 0.1 01/10/2015 0720   IBILI 1.0 (H) 09/05/2012 1508      Component Value Date/Time   TSH 0.47 12/18/2017 0739   TSH 6.94 (H) 10/30/2017 0721   TSH 4.54 (H) 06/03/2017 0806    ECG  shows NSR with a rate of 90 BPM INDIRECT CALORIMETER done today shows a VO2 of 255 and a REE of 1776.  Her calculated basal metabolic rate is 1610 thus her basal metabolic rate is worse than expected.    ASSESSMENT AND PLAN: Other Kelly [name] - Plan: EKG 12-Lead, Hemoglobin A1c, Insulin, random, Folate  Shortness of breath on exertion  B12 nutritional deficiency - Plan: Vitamin B12  Vitamin D deficiency  Depression screening  At risk for osteoporosis  Class 3 severe obesity with serious comorbidity and body mass index (BMI) of 40.0 to 44.9 in adult, unspecified obesity type ([place])  PLAN: Kelly [name] Lakshmi was informed that her Kelly [name] may be related to obesity, depression or many other causes. Labs will be ordered, and in the meanwhile Brihana has agreed to work on diet, exercise and weight loss to help with Kelly [name]. Proper sleep hygiene was discussed including the need for 7-8 hours of quality sleep each night. A sleep study was not ordered based on symptoms and Epworth score. We will order indirect calorimetry and EKG today.  Dyspnea on exertion Whitni's shortness of breath appears to be obesity related and exercise induced. She has agreed to work on weight loss and gradually increase exercise to treat her exercise induced shortness of breath. If Hally follows our instructions and loses weight without improvement of her shortness of breath, we will plan to refer to pulmonology.  We will order  indirect calorimetry, EKG and labs today. We will monitor this condition regularly. Rox agrees to this plan.  B12 Deficiency Kelly [name] will work on increasing B12 rich foods in her diet. B12 supplementation was not prescribed today. We will check vitamin B12 level today and Folate level today. Devone will follow up at the agreed upon time.  Vitamin D Deficiency Kelly [name] was informed that low vitamin D levels contributes to Kelly [name] and are associated with obesity, breast, and colon cancer. She agrees to continue to take prescription Vit D @50 ,000 IU every week and will follow up for routine testing of vitamin D, at least 2-3 times per year. She was informed of the risk of over-replacement of vitamin D and agrees to not increase her dose unless she discusses this with [place] first.  At risk for osteopenia and osteoporosis Kieryn was given extended  (15 minutes) osteoporosis prevention counseling today. Kelly [name] is at risk for osteopenia and osteoporosis due to her vitamin D deficiency. She was encouraged to take her vitamin D and follow her higher calcium diet and increase strengthening exercise to help strengthen her bones and decrease her risk of osteopenia and osteoporosis.  Depression Screen Kelly [name] had a moderately positive depression screening. Depression is commonly associated with obesity and often results in emotional eating behaviors. We will monitor this closely and  work on CBT to help improve the non-hunger eating patterns. Referral to Psychology may be required if no improvement is seen as she continues in our clinic.  Obesity Kelly [name] is currently in the action stage of change and her goal is to continue with weight loss efforts. I recommend Francoise begin the structured treatment plan as follows:  She has agreed to follow the Category 2 plan Shawnita has been instructed to eventually work up to a goal of 150 minutes of combined cardio and strengthening exercise per week for weight loss and overall  health benefits. We discussed the following Behavioral Modification Strategies today: planning for success, increasing lean protein intake, increasing vegetables and work on meal planning and easy cooking plans   She was informed of the importance of frequent follow up visits to maximize her success with intensive lifestyle modifications for her multiple health conditions. She was informed we would discuss her lab results at her next visit unless there is a critical issue that needs to be addressed sooner. Alyana agreed to keep her next visit at the agreed upon time to discuss these results.    OBESITY BEHAVIORAL INTERVENTION VISIT  Today's visit was # 1   Starting weight: 248 lbs Starting date: 01/09/18 Today's weight : 248 lbs Today's date: 01/09/2018 Total lbs lost to date: 0   ASK: We discussed the diagnosis of obesity with Kelly [name] today and Londin agreed to give [place] permission to discuss obesity behavioral modification therapy today.  ASSESS: Lavren has the diagnosis of obesity and her BMI today is 42.55  Emiline is in the action stage of change   ADVISE: Beyonce was educated on the multiple health risks of obesity as well as the benefit of weight loss to improve her health. She was advised of the need for long term treatment and the importance of lifestyle modifications to improve her current health and to decrease her risk of future health problems.  AGREE: Multiple dietary modification options and treatment options were discussed and  Dewana agreed to follow the recommendations documented in the above note.  ARRANGE: Charnae was educated on the importance of frequent visits to treat obesity as outlined per CMS and USPSTF guidelines and agreed to schedule her next follow up appointment today.  I, Doreene Nest, am acting as transcriptionist for Eber Jones, MD   I have reviewed the above documentation for accuracy and completeness, and I agree with the  above. - Ilene Qua, MD

## 2018-01-23 ENCOUNTER — Ambulatory Visit (INDEPENDENT_AMBULATORY_CARE_PROVIDER_SITE_OTHER): Payer: PRIVATE HEALTH INSURANCE | Admitting: Family Medicine

## 2018-01-23 VITALS — BP 140/76 | HR 74 | Temp 98.1°F | Ht 64.0 in | Wt 250.0 lb

## 2018-01-23 DIAGNOSIS — E8881 Metabolic syndrome: Secondary | ICD-10-CM | POA: Diagnosis not present

## 2018-01-23 DIAGNOSIS — E88819 Insulin resistance, unspecified: Secondary | ICD-10-CM

## 2018-01-23 DIAGNOSIS — E66813 Obesity, class 3: Secondary | ICD-10-CM

## 2018-01-23 DIAGNOSIS — Z6841 Body Mass Index (BMI) 40.0 and over, adult: Secondary | ICD-10-CM | POA: Diagnosis not present

## 2018-01-23 DIAGNOSIS — Z9189 Other specified personal risk factors, not elsewhere classified: Secondary | ICD-10-CM | POA: Diagnosis not present

## 2018-01-23 MED ORDER — VITAMIN D (ERGOCALCIFEROL) 1.25 MG (50000 UNIT) PO CAPS: 50000.0000 [IU] | ORAL_CAPSULE | ORAL | 0 refills | Status: DC

## 2018-01-23 NOTE — Progress Notes (Signed)
Office: [phone]  /  Fax: [phone]   HPI:   Chief Complaint: OBESITY Kelly [name] is here to discuss her progress with her obesity treatment plan. She is on the Category 2 plan and is following her eating plan approximately 50 % of the time. She states she is walking for 30 minutes 2-3 times per week. Kelly [name] had a lot going on socially last week, so she didn't follow the plan very strictly. She found that the makeup of meals is very different than she is used to having. She has questions if she is really in the mindset of staying on the meal plan. Her weight is 250 lb (113.4 kg) today and has gained 2 pounds since her last visit. She has lost 0 lbs since starting treatment with [place].  Vitamin D Deficiency Kelly [name] has a diagnosis of vitamin D deficiency. She is on prescription Vit D, but needs a refill. She notes fatigue and denies nausea, vomiting or muscle weakness.  Insulin Resistance Kelly [name] has a diagnosis of insulin resistance based on her elevated fasting insulin level >5. Although Kelly [name]'s blood glucose readings are still under good control, insulin resistance puts her at greater risk of metabolic syndrome and diabetes. She is not taking metformin currently, she notes carbohydrate cravings and denies hypoglycemia. She continues to work on diet and exercise to decrease risk of diabetes.  At risk for diabetes Kelly [name] is at higher than average risk for developing diabetes due to her obesity and insulin resistance. She currently denies polyuria or polydipsia.  ALLERGIES: No Known Allergies  MEDICATIONS: Current Outpatient Medications on File Prior to Visit  Medication Sig Dispense Refill  . ALPRAZolam (XANAX) 0.25 MG tablet Take 1 tablet (0.25 mg total) by mouth 3 (three) times daily as needed for anxiety. 90 tablet 0  . cetirizine (ZYRTEC) 10 MG tablet Take 1 tablet (10 mg total) by mouth daily. 30 tablet 11  . furosemide (LASIX) 40 MG tablet Take 1 tablet (40 mg total) by mouth  daily. 90 tablet 3  . levothyroxine (SYNTHROID, LEVOTHROID) 88 MCG tablet Take 1 tablet (88 mcg total) by mouth daily before breakfast. 90 tablet 0  . metolazone (ZAROXOLYN) 5 MG tablet 1 po 30 min prior to lasix if needed 30 tablet 2  . naproxen sodium (ALEVE) 220 MG tablet Take 220 mg by mouth as needed.    . traMADol (ULTRAM) 50 MG tablet Take 1-2 tablets (50-100 mg total) by mouth daily. 60 tablet 2  . valACYclovir (VALTREX) 1000 MG tablet Take 500 mg by mouth daily.     Marland Kitchen zolpidem (AMBIEN) 5 MG tablet Take 1 tablet (5 mg total) by mouth at bedtime as needed. for sleep 30 tablet 1   No current facility-administered medications on file prior to visit.     PAST MEDICAL HISTORY: Past Medical History:  Diagnosis Date  . Allergic rhinitis   . Anxiety   . Arthritis    feet, rt knee  . B12 deficiency   . Breast cyst   . Depression with anxiety   . Edema   . Heart murmur   . Herpes   . History of hiatal hernia    repaur 2011   . Hypertension   . Hypothyroidism   . 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 Joint pain   . Menopause   . Morbid obesity ([place]) 2009  . Ovarian cyst   . PAC (premature atrial contraction)   . Pedunculated colonic polyp 03/04/15   tubulovillous  adenoma of transverse colon.   . Pneumonia    hx of as a child   . PONV (postoperative nausea and vomiting)   . PVC (premature ventricular contraction)   . Thyroid goiter   . Uterine fibroid   . Vitamin D deficiency     PAST SURGICAL HISTORY: Past Surgical History:  Procedure Laterality Date  . LAPAROSCOPIC GASTRIC BANDING  05-23-09   dr Abran Cantor with hiatal hernia repair.   Marland Kitchen LAPAROSCOPIC RIGHT HEMI COLECTOMY Right 03/25/2015   Procedure: LAPAROSCOPIC ASSISTED RIGHT HEMI COLECTOMY, with removal of lap band fluid prior to prepping.;  Surgeon: Alphonsa Overall, MD;  Location: [facility];  Service: General;  Laterality: Right;  . LIPOSUCTION  1995    SOCIAL HISTORY: Social History   Tobacco Use    . Smoking status: Never Smoker  . Smokeless tobacco: Never Used  Substance Use Topics  . Alcohol use: Yes    Alcohol/week: 3.0 standard drinks    Types: 3 Glasses of wine per week  . Drug use: No    FAMILY HISTORY: Family History  Problem Relation Age of Onset  . Hypertension Mother   . Aneurysm Mother 33       brain  . Hyperlipidemia Mother   . Stroke Mother   . Thyroid disease Mother   . Hypertension Father   . Hyperlipidemia Father   . Obesity Father   . Hypertension Maternal Grandmother   . Hypertension Paternal Grandmother   . Heart disease Paternal Grandmother 69       MI  . Diabetes Paternal Aunt   . Heart disease Maternal Grandfather        MI  . Alzheimer's disease Paternal Grandfather   . Colon cancer Neg Hx   . Esophageal cancer Neg Hx   . Rectal cancer Neg Hx   . Stomach cancer Neg Hx     ROS: Review of Systems  Constitutional: Positive for malaise/fatigue. Negative for weight loss.  Gastrointestinal: Negative for nausea and vomiting.  Genitourinary: Negative for frequency.  Musculoskeletal:       Negative muscle weakness  Endo/Heme/Allergies: Negative for polydipsia.       Negative hypoglycemia    PHYSICAL EXAM: Blood pressure 140/76, pulse 74, temperature 98.1 F (36.7 C), temperature source Oral, height 5\' 4"  (1.626 m), weight 250 lb (113.4 kg), SpO2 100 %. Body mass index is 42.91 kg/m. Physical Exam  Constitutional: She is oriented to person, place, and time. She appears well-developed and well-nourished.  Cardiovascular: Normal rate.  Pulmonary/Chest: Effort normal.  Musculoskeletal: Normal range of motion.  Neurological: She is oriented to person, place, and time.  Skin: Skin is warm and dry.  Psychiatric: She has a normal mood and affect. Her behavior is normal.  Vitals reviewed.   RECENT LABS AND TESTS: BMET    Component Value Date/Time   NA 140 12/18/2017 0739   K 4.6 12/18/2017 0739   CL 104 12/18/2017 0739   CO2 30  12/18/2017 0739   GLUCOSE 93 12/18/2017 0739   BUN 10 12/18/2017 0739   CREATININE 0.98 12/18/2017 0739   CREATININE 1.07 (H) 07/27/2016 1505   CALCIUM 9.5 12/18/2017 0739   GFRNONAA >60 03/26/2015 0510   GFRAA >60 03/26/2015 0510   Lab Results  Component Value Date   HGBA1C 5.3 01/09/2018   HGBA1C 4.9 07/27/2016   HGBA1C 5.2 05/11/2016   HGBA1C 5.2 03/25/2015   Lab Results  Component Value Date   INSULIN 11.5 01/09/2018   CBC  Component Value Date/Time   WBC 6.4 12/18/2017 0739   RBC 4.44 12/18/2017 0739   HGB 13.5 12/18/2017 0739   HCT 40.3 12/18/2017 0739   PLT 324.0 12/18/2017 0739   MCV 90.8 12/18/2017 0739   MCH 30.3 03/26/2015 0510   MCHC 33.6 12/18/2017 0739   RDW 12.5 12/18/2017 0739   LYMPHSABS 2.3 12/18/2017 0739   MONOABS 0.3 12/18/2017 0739   EOSABS 0.2 12/18/2017 0739   BASOSABS 0.1 12/18/2017 0739   Iron/TIBC/Ferritin/ %Sat No results found for: IRON, TIBC, FERRITIN, IRONPCTSAT Lipid Panel     Component Value Date/Time   CHOL 214 (H) 10/30/2017 0721   TRIG 96.0 10/30/2017 0721   HDL 66.80 10/30/2017 0721   CHOLHDL 3 10/30/2017 0721   VLDL 19.2 10/30/2017 0721   LDLCALC 128 (H) 10/30/2017 0721   Hepatic Function Panel     Component Value Date/Time   PROT 7.1 12/18/2017 0739   ALBUMIN 3.7 12/18/2017 0739   AST 10 12/18/2017 0739   ALT 7 12/18/2017 0739   ALKPHOS 93 12/18/2017 0739   BILITOT 0.6 12/18/2017 0739   BILIDIR 0.1 01/10/2015 0720   IBILI 1.0 (H) 09/05/2012 1508      Component Value Date/Time   TSH 0.47 12/18/2017 0739   TSH 6.94 (H) 10/30/2017 0721   TSH 4.54 (H) 06/03/2017 0806  Results for MARAYAH, HIGDON (MRN [id number]) as of 01/23/2018 17:19  Ref. Range 10/30/2017 07:21  VITD Latest Ref Range: 30.00 - 100.00 ng/mL 19.23 (L)    ASSESSMENT AND PLAN: Insulin resistance - Plan: Vitamin D, Ergocalciferol, (DRISDOL) 50000 units CAPS capsule  At risk for diabetes mellitus  Class 3 severe obesity with serious comorbidity  and body mass index (BMI) of 40.0 to 44.9 in adult, unspecified obesity type ([place])  PLAN:  Vitamin D Deficiency Kelly [name] was informed that low vitamin D levels contributes to fatigue and are associated with obesity, breast, and colon cancer. Kelly [name] agrees to continue taking prescription Vit D @50 ,000 IU every week #4 and we will refill for 1 month. She will follow up for routine testing of vitamin D, at least 2-3 times per year. She was informed of the risk of over-replacement of vitamin D and agrees to not increase her dose unless she discusses this with [place] first. Kelly [name] agrees to follow up with our clinic in 2 weeks.  Insulin Resistance Kelly [name] will continue to work on weight loss, exercise, and decreasing simple carbohydrates in her diet to help decrease the risk of diabetes. We dicussed metformin including benefits and risks. She was informed that eating too many simple carbohydrates or too many calories at one sitting increases the likelihood of GI side effects. Kelly [name] declined metformin for now and prescription was not written today. We will repeat labs in 3 months. Kelly [name] agrees to follow up with our clinic in 2 weeks as directed to monitor her progress.  Diabetes risk counselling Kelly [name] was given extended (30 minutes) diabetes prevention counseling today. She is 52 y.o. female and has risk factors for diabetes including obesity and insulin resistance. We discussed intensive lifestyle modifications today with an emphasis on weight loss as well as increasing exercise and decreasing simple carbohydrates in her diet.  Obesity Kelly [name] is currently in the action stage of change. As such, her goal is to continue with weight loss efforts She has agreed to keep a food journal with 400-500 calories and 35+ grams of protein at supper daily and follow the Category 2 plan Kelly [name] has been instructed to  work up to a goal of 150 minutes of combined cardio and strengthening exercise per week for weight loss and  overall health benefits. We discussed the following Behavioral Modification Strategies today: increasing lean protein intake, increasing vegetables, work on meal planning and easy cooking plans, and planning for success Kelly [name] states she can commit to breakfast and lunch most days.  Kelly [name] has agreed to follow up with our clinic in 2 weeks. She was informed of the importance of frequent follow up visits to maximize her success with intensive lifestyle modifications for her multiple health conditions.   OBESITY BEHAVIORAL INTERVENTION VISIT  Today's visit was # 2   Starting weight: 248 lbs Starting date: 01/09/18 Today's weight : 250 lbs  Today's date: 01/23/2018 Total lbs lost to date: 0    ASK: We discussed the diagnosis of obesity with Kelly [name] today and Kelly [name] agreed to give [place] permission to discuss obesity behavioral modification therapy today.  ASSESS: Kelly [name] has the diagnosis of obesity and her BMI today is 42.89 Kelly [name] is in the action stage of change   ADVISE: Kelly [name] was educated on the multiple health risks of obesity as well as the benefit of weight loss to improve her health. She was advised of the need for long term treatment and the importance of lifestyle modifications to improve her current health and to decrease her risk of future health problems.  AGREE: Multiple dietary modification options and treatment options were discussed and  Kelly [name] agreed to follow the recommendations documented in the above note.  ARRANGE: Kelly [name] was educated on the importance of frequent visits to treat obesity as outlined per CMS and USPSTF guidelines and agreed to schedule her next follow up appointment today.  I, Trixie Dredge, am acting as transcriptionist for Ilene Qua, MD  I have reviewed the above documentation for accuracy and completeness, and I agree with the above. - Ilene Qua, MD

## 2018-01-28 ENCOUNTER — Ambulatory Visit: Payer: PRIVATE HEALTH INSURANCE | Admitting: Family Medicine

## 2018-02-04 ENCOUNTER — Other Ambulatory Visit: Payer: Self-pay | Admitting: Family Medicine

## 2018-02-13 ENCOUNTER — Ambulatory Visit (INDEPENDENT_AMBULATORY_CARE_PROVIDER_SITE_OTHER): Payer: PRIVATE HEALTH INSURANCE | Admitting: Family Medicine

## 2018-02-13 VITALS — BP 123/67 | HR 79 | Temp 97.7°F | Ht 64.0 in | Wt 248.0 lb

## 2018-02-13 DIAGNOSIS — E039 Hypothyroidism, unspecified: Secondary | ICD-10-CM | POA: Diagnosis not present

## 2018-02-13 DIAGNOSIS — Z6841 Body Mass Index (BMI) 40.0 and over, adult: Secondary | ICD-10-CM | POA: Diagnosis not present

## 2018-02-13 DIAGNOSIS — E559 Vitamin D deficiency, unspecified: Secondary | ICD-10-CM | POA: Diagnosis not present

## 2018-02-18 ENCOUNTER — Other Ambulatory Visit: Payer: Self-pay | Admitting: Family Medicine

## 2018-02-18 DIAGNOSIS — G4700 Insomnia, unspecified: Secondary | ICD-10-CM

## 2018-02-18 NOTE — Progress Notes (Signed)
Office: [phone]  /  Fax: [phone]   HPI:   Chief Complaint: OBESITY Kelly [name] is here to discuss her progress with her obesity treatment plan. She is on the  follow the Category 2 plan and is following her eating plan approximately 70 % of the time. She states she is exercising 0 minutes 0 times per week. Kelly [name] finds herself habitually indulging at dinner time and has done almost 100% of breakfast and lunch. She is getting in about 6 oz for dinner protein. She has cut down on her wine consumption. She is concerned about what her Thanksgiving indulgence will be.  Her weight is 248 lb (112.5 kg) today and has had a weight loss of 2 pounds over a period of 3 weeks since her last visit. She has lost 0 lbs since starting treatment with [place].  Vitamin D deficiency Kelly [name] has a diagnosis of vitamin D deficiency. She is currently taking vit D and denies nausea, vomiting or muscle weakness. She reports fatigue.   Ref. Range 10/30/2017 07:21  VITD Latest Ref Range: 30.00 - 100.00 ng/mL 19.23 (L)   Hypothyroid Kelly [name] has a diagnosis of hypothyroidism. She is on levothyroxine. She denies hot or cold intolerance or palpitations, but does admit to ongoing fatigue.  ALLERGIES: No Known Allergies  MEDICATIONS: Current Outpatient Medications on File Prior to Visit  Medication Sig Dispense Refill  . ALPRAZolam (XANAX) 0.25 MG tablet Take 1 tablet (0.25 mg total) by mouth 3 (three) times daily as needed for anxiety. 90 tablet 0  . cetirizine (ZYRTEC) 10 MG tablet Take 1 tablet (10 mg total) by mouth daily. 30 tablet 11  . furosemide (LASIX) 40 MG tablet Take 1 tablet (40 mg total) by mouth daily. 90 tablet 3  . levothyroxine (SYNTHROID, LEVOTHROID) 88 MCG tablet TAKE 1 TABLET BY MOUTH ONCE DAILY BEFORE BREAKFAST 90 tablet 1  . metolazone (ZAROXOLYN) 5 MG tablet 1 po 30 min prior to lasix if needed 30 tablet 2  . naproxen sodium (ALEVE) 220 MG tablet Take 220 mg by mouth as needed.    . traMADol  (ULTRAM) 50 MG tablet Take 1-2 tablets (50-100 mg total) by mouth daily. 60 tablet 2  . valACYclovir (VALTREX) 1000 MG tablet Take 500 mg by mouth daily.     . Vitamin D, Ergocalciferol, (DRISDOL) 50000 units CAPS capsule Take 1 capsule (50,000 Units total) by mouth every 7 (seven) days. 12 capsule 0  . zolpidem (AMBIEN) 5 MG tablet Take 1 tablet (5 mg total) by mouth at bedtime as needed. for sleep 30 tablet 1   No current facility-administered medications on file prior to visit.     PAST MEDICAL HISTORY: Past Medical History:  Diagnosis Date  . Allergic rhinitis   . Anxiety   . Arthritis    feet, rt knee  . B12 deficiency   . Breast cyst   . Depression with anxiety   . Edema   . Heart murmur   . Herpes   . History of hiatal hernia    repaur 2011   . Hypertension   . Hypothyroidism   . Intussusception of colon ([place]) 73/71/06   ileocolic  . Joint pain   . Menopause   . Morbid obesity ([place]) 2009  . Ovarian cyst   . PAC (premature atrial contraction)   . Pedunculated colonic polyp 03/04/15   tubulovillous adenoma of transverse colon.   . Pneumonia    hx of as a child   . PONV (postoperative nausea  and vomiting)   . PVC (premature ventricular contraction)   . Thyroid goiter   . Uterine fibroid   . Vitamin D deficiency     PAST SURGICAL HISTORY: Past Surgical History:  Procedure Laterality Date  . LAPAROSCOPIC GASTRIC BANDING  05-23-09   dr Abran Cantor with hiatal hernia repair.   Marland Kitchen LAPAROSCOPIC RIGHT HEMI COLECTOMY Right 03/25/2015   Procedure: LAPAROSCOPIC ASSISTED RIGHT HEMI COLECTOMY, with removal of lap band fluid prior to prepping.;  Surgeon: Alphonsa Overall, MD;  Location: [facility];  Service: General;  Laterality: Right;  . LIPOSUCTION  1995    SOCIAL HISTORY: Social History   Tobacco Use  . Smoking status: Never Smoker  . Smokeless tobacco: Never Used  Substance Use Topics  . Alcohol use: Yes    Alcohol/week: 3.0 standard drinks    Types: 3 Glasses of wine per  week  . Drug use: No    FAMILY HISTORY: Family History  Problem Relation Age of Onset  . Hypertension Mother   . Aneurysm Mother 32       brain  . Hyperlipidemia Mother   . Stroke Mother   . Thyroid disease Mother   . Hypertension Father   . Hyperlipidemia Father   . Obesity Father   . Hypertension Maternal Grandmother   . Hypertension Paternal Grandmother   . Heart disease Paternal Grandmother 37       MI  . Diabetes Paternal Aunt   . Heart disease Maternal Grandfather        MI  . Alzheimer's disease Paternal Grandfather   . Colon cancer Neg Hx   . Esophageal cancer Neg Hx   . Rectal cancer Neg Hx   . Stomach cancer Neg Hx     ROS: Review of Systems  Constitutional: Positive for malaise/fatigue and weight loss.  Gastrointestinal: Negative for nausea and vomiting.  Musculoskeletal:       Negative for muscle weakness  Endo/Heme/Allergies:       Negative for hot/cold intolerance    PHYSICAL EXAM: Blood pressure 123/67, pulse 79, temperature 97.7 F (36.5 C), temperature source Oral, height 5\' 4"  (1.626 m), weight 248 lb (112.5 kg), SpO2 99 %. Body mass index is 42.57 kg/m. Physical Exam  Constitutional: She is oriented to person, place, and time. She appears well-developed and well-nourished.  HENT:  Head: Normocephalic.  Neck: Normal range of motion.  Cardiovascular: Normal rate.  Pulmonary/Chest: Effort normal.  Musculoskeletal: Normal range of motion.  Neurological: She is alert and oriented to person, place, and time.  Skin: Skin is warm and dry.  Psychiatric: She has a normal mood and affect. Her behavior is normal.  Vitals reviewed.   RECENT LABS AND TESTS: BMET    Component Value Date/Time   NA 140 12/18/2017 0739   K 4.6 12/18/2017 0739   CL 104 12/18/2017 0739   CO2 30 12/18/2017 0739   GLUCOSE 93 12/18/2017 0739   BUN 10 12/18/2017 0739   CREATININE 0.98 12/18/2017 0739   CREATININE 1.07 (H) 07/27/2016 1505   CALCIUM 9.5 12/18/2017 0739     GFRNONAA >60 03/26/2015 0510   GFRAA >60 03/26/2015 0510   Lab Results  Component Value Date   HGBA1C 5.3 01/09/2018   HGBA1C 4.9 07/27/2016   HGBA1C 5.2 05/11/2016   HGBA1C 5.2 03/25/2015   Lab Results  Component Value Date   INSULIN 11.5 01/09/2018   CBC    Component Value Date/Time   WBC 6.4 12/18/2017 0739   RBC 4.44 12/18/2017  0739   HGB 13.5 12/18/2017 0739   HCT 40.3 12/18/2017 0739   PLT 324.0 12/18/2017 0739   MCV 90.8 12/18/2017 0739   MCH 30.3 03/26/2015 0510   MCHC 33.6 12/18/2017 0739   RDW 12.5 12/18/2017 0739   LYMPHSABS 2.3 12/18/2017 0739   MONOABS 0.3 12/18/2017 0739   EOSABS 0.2 12/18/2017 0739   BASOSABS 0.1 12/18/2017 0739   Iron/TIBC/Ferritin/ %Sat No results found for: IRON, TIBC, FERRITIN, IRONPCTSAT Lipid Panel     Component Value Date/Time   CHOL 214 (H) 10/30/2017 0721   TRIG 96.0 10/30/2017 0721   HDL 66.80 10/30/2017 0721   CHOLHDL 3 10/30/2017 0721   VLDL 19.2 10/30/2017 0721   LDLCALC 128 (H) 10/30/2017 0721   Hepatic Function Panel     Component Value Date/Time   PROT 7.1 12/18/2017 0739   ALBUMIN 3.7 12/18/2017 0739   AST 10 12/18/2017 0739   ALT 7 12/18/2017 0739   ALKPHOS 93 12/18/2017 0739   BILITOT 0.6 12/18/2017 0739   BILIDIR 0.1 01/10/2015 0720   IBILI 1.0 (H) 09/05/2012 1508      Component Value Date/Time   TSH 0.47 12/18/2017 0739   TSH 6.94 (H) 10/30/2017 0721   TSH 4.54 (H) 06/03/2017 0806    Ref. Range 10/30/2017 07:21  VITD Latest Ref Range: 30.00 - 100.00 ng/mL 19.23 (L)    ASSESSMENT AND PLAN: Vitamin D deficiency  Hypothyroidism, unspecified type  Class 3 severe obesity with serious comorbidity and body mass index (BMI) of 40.0 to 44.9 in adult, unspecified obesity type ([facility])  PLAN: Vitamin D Deficiency Sophy was informed that low vitamin D levels contributes to fatigue and are associated with obesity, breast, and colon cancer. She agrees to continue to take prescription Vit D @50 ,000 IU every  week (no refill needed) and will follow up for routine testing of vitamin D, at least 2-3 times per year. She was informed of the risk of over-replacement of vitamin D and agrees to not increase her dose unless she discusses this with [place] first. Agrees to follow up with our clinic as directed.   Hypothyroid Adilyn was informed of the importance of good thyroid control to help with weight loss efforts. She was also informed that supertheraputic thyroid levels are dangerous and will not improve weight loss results.   Obesity Janeece is currently in the action stage of change. As such, her goal is to continue with weight loss efforts She has agreed to follow the Category 2 plan [place] has been instructed to work up to a goal of 150 minutes of combined cardio and strengthening exercise per week for weight loss and overall health benefits. We discussed the following Behavioral Modification Strategies today: increasing lean protein intake, increasing vegetables, work on meal planning and easy cooking plans and holiday eating strategies    Ceira has agreed to follow up with our clinic in 2 weeks. She was informed of the importance of frequent follow up visits to maximize her success with intensive lifestyle modifications for her multiple health conditions.   OBESITY BEHAVIORAL INTERVENTION VISIT  Today's visit was # 3   Starting weight:248 lb Starting date: 01/09/18 Today's weight : Weight: 248 lb (112.5 kg)  Today's date: 02/13/18 Total lbs lost to date: 0 At least 15 minutes were spent on discussing the following behavioral intervention visit.   ASK: We discussed the diagnosis of obesity with Pincus Badder today and Mckinzey agreed to give [place] permission to discuss obesity behavioral modification therapy  today.  ASSESS: Annora has the diagnosis of obesity and her BMI today is 42.55 Shaunette is in the action stage of change   ADVISE: Tenessa was educated on the multiple health risks of  obesity as well as the benefit of weight loss to improve her health. She was advised of the need for long term treatment and the importance of lifestyle modifications to improve her current health and to decrease her risk of future health problems.  AGREE: Multiple dietary modification options and treatment options were discussed and  Gargi agreed to follow the recommendations documented in the above note.  ARRANGE: Zylpha was educated on the importance of frequent visits to treat obesity as outlined per CMS and USPSTF guidelines and agreed to schedule her next follow up appointment today.  I, Renee Ramus, am acting as transcriptionist for Ilene Qua, MD   I have reviewed the above documentation for accuracy and completeness, and I agree with the above. - Ilene Qua, MD

## 2018-02-18 NOTE — Telephone Encounter (Signed)
Refill Request: Zolpidem   Last RX:10/29/17 Last OV:02/13/18 Next RT:MYTR scheduled  UDS:07/09/13 CSC: CSR:

## 2018-03-04 ENCOUNTER — Encounter (INDEPENDENT_AMBULATORY_CARE_PROVIDER_SITE_OTHER): Payer: Self-pay

## 2018-03-04 ENCOUNTER — Ambulatory Visit (INDEPENDENT_AMBULATORY_CARE_PROVIDER_SITE_OTHER): Payer: PRIVATE HEALTH INSURANCE | Admitting: Family Medicine

## 2018-03-11 ENCOUNTER — Ambulatory Visit: Payer: PRIVATE HEALTH INSURANCE | Admitting: Family Medicine

## 2018-03-11 ENCOUNTER — Ambulatory Visit (INDEPENDENT_AMBULATORY_CARE_PROVIDER_SITE_OTHER): Payer: PRIVATE HEALTH INSURANCE | Admitting: Family Medicine

## 2018-03-11 ENCOUNTER — Encounter: Payer: Self-pay | Admitting: Family Medicine

## 2018-03-11 VITALS — BP 130/80 | HR 90 | Temp 98.3°F | Resp 16 | Ht 64.0 in | Wt 261.2 lb

## 2018-03-11 DIAGNOSIS — I5189 Other ill-defined heart diseases: Secondary | ICD-10-CM

## 2018-03-11 DIAGNOSIS — R600 Localized edema: Secondary | ICD-10-CM

## 2018-03-11 DIAGNOSIS — R079 Chest pain, unspecified: Secondary | ICD-10-CM

## 2018-03-11 NOTE — Progress Notes (Addendum)
Patient ID: RAPHAELLA [name], female    DOB: 08/02/1965  Age: 52 y.o. MRN: [id number]    Subjective:  Subjective  HPI Kelly [name] presents for sob, low ext edema and L sided chest pain that comes and goes.  No chest pain today.  Swelling also comes and goes  Review of Systems  Constitutional: Negative for chills and fever.  HENT: Negative for congestion and hearing loss.   Eyes: Negative for discharge.  Respiratory: Positive for shortness of breath. Negative for cough.   Cardiovascular: Positive for chest pain and leg swelling. Negative for palpitations.  Gastrointestinal: Negative for abdominal pain, blood in stool, constipation, diarrhea, nausea and vomiting.  Genitourinary: Negative for dysuria, frequency, hematuria and urgency.  Musculoskeletal: Negative for back pain and myalgias.  Skin: Negative for rash.  Allergic/Immunologic: Negative for environmental allergies.  Neurological: Negative for dizziness, weakness and headaches.  Hematological: Does not bruise/bleed easily.  Psychiatric/Behavioral: Negative for suicidal ideas. The patient is not nervous/anxious.     History Past Medical History:  Diagnosis Date  . Allergic rhinitis   . Anxiety   . Arthritis    feet, rt knee  . B12 deficiency   . Breast cyst   . Depression with anxiety   . Edema   . Heart murmur   . Herpes   . History of hiatal hernia    repaur 2011   . Hypertension   . Hypothyroidism   . 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 Joint pain   . Menopause   . Morbid obesity ([place]) 2009  . Ovarian cyst   . PAC (premature atrial contraction)   . Pedunculated colonic polyp 03/04/15   tubulovillous adenoma of transverse colon.   . Pneumonia    hx of as a child   . PONV (postoperative nausea and vomiting)   . PVC (premature ventricular contraction)   . Thyroid goiter   . Uterine fibroid   . Vitamin D deficiency     She has a past surgical history that includes Laparoscopic gastric  banding (05-23-09); Liposuction (1995); and Laparoscopic right hemi colectomy (Right, 03/25/2015).   Her family history includes Alzheimer's disease in her paternal grandfather; Aneurysm (age of onset: 87) in her mother; Diabetes in her paternal aunt; Heart disease in her maternal grandfather; Heart disease (age of onset: 75) in her paternal grandmother; Hyperlipidemia in her father and mother; Hypertension in her father, maternal grandmother, mother, and paternal grandmother; Obesity in her father; Stroke in her mother; Thyroid disease in her mother.She reports that she has never smoked. She has never used smokeless tobacco. She reports current alcohol use of about 3.0 standard drinks of alcohol per week. She reports that she does not use drugs.  Current Outpatient Medications on File Prior to Visit  Medication Sig Dispense Refill  . ALPRAZolam (XANAX) 0.25 MG tablet Take 1 tablet (0.25 mg total) by mouth 3 (three) times daily as needed for anxiety. 90 tablet 0  . cetirizine (ZYRTEC) 10 MG tablet Take 1 tablet (10 mg total) by mouth daily. 30 tablet 11  . furosemide (LASIX) 40 MG tablet Take 1 tablet (40 mg total) by mouth daily. 90 tablet 3  . levothyroxine (SYNTHROID, LEVOTHROID) 88 MCG tablet TAKE 1 TABLET BY MOUTH ONCE DAILY BEFORE BREAKFAST 90 tablet 1  . metolazone (ZAROXOLYN) 5 MG tablet 1 po 30 min prior to lasix if needed 30 tablet 2  . naproxen sodium (ALEVE) 220 MG tablet Take 220 mg by mouth  as needed.    . traMADol (ULTRAM) 50 MG tablet Take 1-2 tablets (50-100 mg total) by mouth daily. 60 tablet 2  . valACYclovir (VALTREX) 1000 MG tablet Take 500 mg by mouth daily.     . Vitamin D, Ergocalciferol, (DRISDOL) 50000 units CAPS capsule Take 1 capsule (50,000 Units total) by mouth every 7 (seven) days. 12 capsule 0  . zolpidem (AMBIEN) 5 MG tablet TAKE 1 TABLET BY MOUTH ONCE DAILY AT BEDTIME AS NEEDED FOR SLEEP 30 tablet 1   No current facility-administered medications on file prior to  visit.      Objective:  Objective  Physical Exam Vitals signs and nursing note reviewed.  Constitutional:      Appearance: She is well-developed.  HENT:     Head: Normocephalic and atraumatic.  Eyes:     Conjunctiva/sclera: Conjunctivae normal.  Neck:     Musculoskeletal: Normal range of motion and neck supple.     Thyroid: No thyromegaly.     Vascular: No carotid bruit or JVD.  Cardiovascular:     Rate and Rhythm: Normal rate and regular rhythm.     Heart sounds: Murmur present.  Pulmonary:     Effort: Pulmonary effort is normal. No respiratory distress.     Breath sounds: Normal breath sounds. No wheezing or rales.  Chest:     Chest wall: No tenderness.  Neurological:     Mental Status: She is alert and oriented to person, place, and time.     Cranial Nerves: No cranial nerve deficit.     Sensory: No sensory deficit.     Motor: No weakness.     Coordination: Coordination normal.     Gait: Gait normal.     Deep Tendon Reflexes: Reflexes normal.    BP 130/80 (BP Location: Left Arm, Cuff Size: Large)   Pulse 90   Temp 98.3 F (36.8 C) (Oral)   Resp 16   Ht 5\' 4"  (1.626 m)   Wt 261 lb 3.2 oz (118.5 kg)   SpO2 98%   BMI 44.83 kg/m  Wt Readings from Last 3 Encounters:  03/11/18 261 lb 3.2 oz (118.5 kg)  02/13/18 248 lb (112.5 kg)  01/23/18 250 lb (113.4 kg)     Lab Results  Component Value Date   WBC 6.4 12/18/2017   HGB 13.5 12/18/2017   HCT 40.3 12/18/2017   PLT 324.0 12/18/2017   GLUCOSE 93 12/18/2017   CHOL 214 (H) 10/30/2017   TRIG 96.0 10/30/2017   HDL 66.80 10/30/2017   LDLCALC 128 (H) 10/30/2017   ALT 7 12/18/2017   AST 10 12/18/2017   NA 140 12/18/2017   K 4.6 12/18/2017   CL 104 12/18/2017   CREATININE 0.98 12/18/2017   BUN 10 12/18/2017   CO2 30 12/18/2017   TSH 0.47 12/18/2017   HGBA1C 5.3 01/09/2018   MICROALBUR <0.7 01/10/2015    No results found.   Assessment & Plan:  Plan  I am having Kelly [name] maintain her  valACYclovir, cetirizine, naproxen sodium, furosemide, traMADol, ALPRAZolam, metolazone, Vitamin D (Ergocalciferol), levothyroxine, and zolpidem.  No orders of the defined types were placed in this encounter.   Problem List Items Addressed This Visit    None    Visit Diagnoses    Chest pain, unspecified type    -  Primary   Relevant Orders   EKG 12-Lead (Completed)   DG Chest 2 View   Ambulatory referral to Cardiology   Lower extremity edema  Relevant Orders   DG Chest 2 View   Ambulatory referral to Cardiology   Diastolic dysfunction       Relevant Orders   Ambulatory referral to Cardiology    it cp reoccurs --- go to ER   Follow-up: Return in about 3 months (around 06/10/2018), or if symptoms worsen or fail to improve.  Ann Held, DO

## 2018-03-11 NOTE — Patient Instructions (Signed)
Edema Edema is an abnormal buildup of fluids in your bodytissues. Edema is somewhatdependent on gravity to pull the fluid to the lowest place in your body. That makes the condition more common in the legs and thighs (lower extremities). Painless swelling of the feet and ankles is common and becomes more likely as you get older. It is also common in looser tissues, like around your eyes. When the affected area is squeezed, the fluid may move out of that spot and leave a dent for a few moments. This dent is called pitting. What are the causes? There are many possible causes of edema. Eating too much salt and being on your feet or sitting for a long time can cause edema in your legs and ankles. Hot weather may make edema worse. Common medical causes of edema include:  Heart failure.  Liver disease.  Kidney disease.  Weak blood vessels in your legs.  Cancer.  An injury.  Pregnancy.  Some medications.  Obesity.  What are the signs or symptoms? Edema is usually painless.Your skin may look swollen or shiny. How is this diagnosed? Your health care provider may be able to diagnose edema by asking about your medical history and doing a physical exam. You may need to have tests such as X-rays, an electrocardiogram, or blood tests to check for medical conditions that may cause edema. How is this treated? Edema treatment depends on the cause. If you have heart, liver, or kidney disease, you need the treatment appropriate for these conditions. General treatment may include:  Elevation of the affected body part above the level of your heart.  Compression of the affected body part. Pressure from elastic bandages or support stockings squeezes the tissues and forces fluid back into the blood vessels. This keeps fluid from entering the tissues.  Restriction of fluid and salt intake.  Use of a water pill (diuretic). These medications are appropriate only for some types of edema. They pull fluid  out of your body and make you urinate more often. This gets rid of fluid and reduces swelling, but diuretics can have side effects. Only use diuretics as directed by your health care provider.  Follow these instructions at home:  Keep the affected body part above the level of your heart when you are lying down.  Do not sit still or stand for prolonged periods.  Do not put anything directly under your knees when lying down.  Do not wear constricting clothing or garters on your upper legs.  Exercise your legs to work the fluid back into your blood vessels. This may help the swelling go down.  Wear elastic bandages or support stockings to reduce ankle swelling as directed by your health care provider.  Eat a low-salt diet to reduce fluid if your health care provider recommends it.  Only take medicines as directed by your health care provider. Contact a health care provider if:  Your edema is not responding to treatment.  You have heart, liver, or kidney disease and notice symptoms of edema.  You have edema in your legs that does not improve after elevating them.  You have sudden and unexplained weight gain. Get help right away if:  You develop shortness of breath or chest pain.  You cannot breathe when you lie down.  You develop pain, redness, or warmth in the swollen areas.  You have heart, liver, or kidney disease and suddenly get edema.  You have a fever and your symptoms suddenly get worse. This information is   not intended to replace advice given to you by your health care provider. Make sure you discuss any questions you have with your health care provider. Document Released: 03/12/2005 Document Revised: 08/18/2015 Document Reviewed: 01/02/2013 Else[employer] Interactive Patient Education  2017 [employer]  

## 2018-03-11 NOTE — Addendum Note (Signed)
Addended by: Roma Schanz R on: 03/11/2018 06:03 PM   Modules accepted: Orders

## 2018-03-14 ENCOUNTER — Ambulatory Visit (HOSPITAL_BASED_OUTPATIENT_CLINIC_OR_DEPARTMENT_OTHER)
Admission: RE | Admit: 2018-03-14 | Discharge: 2018-03-14 | Disposition: A | Discharge status: Home / Self Care | Payer: PRIVATE HEALTH INSURANCE | Source: Ambulatory Visit | Attending: Family Medicine | Admitting: Family Medicine

## 2018-03-14 DIAGNOSIS — R600 Localized edema: Secondary | ICD-10-CM | POA: Insufficient documentation

## 2018-03-14 DIAGNOSIS — R079 Chest pain, unspecified: Secondary | ICD-10-CM | POA: Diagnosis present

## 2018-03-16 ENCOUNTER — Encounter: Payer: Self-pay | Admitting: Gastroenterology

## 2018-04-07 ENCOUNTER — Telehealth: Payer: Self-pay | Admitting: *Deleted

## 2018-04-07 NOTE — Telephone Encounter (Signed)
Copied from [place] [id number]. Topic: General - Other >> Apr 07, 2018  3:43 PM Leward Quan A wrote: Reason for CRM: Felicia with [facility] called to request most recent office notes, labs, EKG or other testing completed on patient.   Ph# [phone] Fax# [phone]

## 2018-04-08 NOTE — Telephone Encounter (Signed)
Ov notes, ekg, and cxr faxed over to [facility] cardiology

## 2018-04-15 ENCOUNTER — Other Ambulatory Visit: Payer: Self-pay | Admitting: Radiology

## 2018-04-15 DIAGNOSIS — Z803 Family history of malignant neoplasm of breast: Secondary | ICD-10-CM

## 2018-04-29 ENCOUNTER — Other Ambulatory Visit: Payer: Self-pay | Admitting: Cardiology

## 2018-04-29 DIAGNOSIS — R609 Edema, unspecified: Secondary | ICD-10-CM

## 2018-04-29 DIAGNOSIS — R079 Chest pain, unspecified: Secondary | ICD-10-CM

## 2018-04-29 DIAGNOSIS — E785 Hyperlipidemia, unspecified: Secondary | ICD-10-CM

## 2018-05-01 ENCOUNTER — Other Ambulatory Visit: Payer: Self-pay | Admitting: Family Medicine

## 2018-05-01 DIAGNOSIS — M1990 Unspecified osteoarthritis, unspecified site: Secondary | ICD-10-CM

## 2018-05-02 NOTE — Telephone Encounter (Signed)
  Last written: 10/29/17 Last ov: 03/11/18 Next ov: none Contract: none UDS: none

## 2018-05-06 ENCOUNTER — Telehealth: Payer: Self-pay

## 2018-05-06 NOTE — Telephone Encounter (Signed)
We need to reschedule pt's 05/14/18 10:30 am to a later time that day. Due to test starting 12 pm instead of in the morning.

## 2018-05-07 ENCOUNTER — Other Ambulatory Visit: Payer: Self-pay | Admitting: Cardiology

## 2018-05-08 LAB — BASIC METABOLIC PANEL
BUN/Creatinine Ratio: 7 — ABNORMAL LOW (ref 9–23)
BUN: 9 mg/dL (ref 6–24)
CO2: 28 mmol/L (ref 20–29)
Calcium: 9.5 mg/dL (ref 8.7–10.2)
Chloride: 97 mmol/L (ref 96–106)
Creatinine, Ser: 1.3 mg/dL — ABNORMAL HIGH (ref 0.57–1.00)
GFR calc Af Amer: 54 mL/min/{1.73_m2} — ABNORMAL LOW (ref >59)
GFR calc non Af Amer: 47 mL/min/{1.73_m2} — ABNORMAL LOW (ref >59)
Glucose: 88 mg/dL (ref 65–99)
POTASSIUM: 4 mmol/L (ref 3.5–5.2)
Sodium: 140 mmol/L (ref 134–144)

## 2018-05-09 NOTE — Progress Notes (Signed)
Take a half a tablet and will recheck in 10 days

## 2018-05-09 NOTE — Progress Notes (Signed)
She is only taking spironalactone w/ hctz

## 2018-05-09 NOTE — Telephone Encounter (Signed)
Patient called and stated that she's doing well, she was [place] not to proceed with GXT, advised her that she can hold off on the GXT but to keep an appointment to continue to follow-up with me for now.

## 2018-05-20 ENCOUNTER — Telehealth: Payer: Self-pay | Admitting: *Deleted

## 2018-05-20 NOTE — Telephone Encounter (Signed)
1st Shingrix given 10/29/17. Spoke with pt about scheduling nurse visit to complete 2nd vaccine. Pt states she needs an appointment around 4:30 and her insurance will be running out on Friday. New insurance will go into effect on 06/08/18. No availability for requested time this week. Pt states she will call back after new insurance starts to schedule nurse visit.

## 2018-05-21 ENCOUNTER — Other Ambulatory Visit: Payer: Self-pay | Admitting: Family Medicine

## 2018-05-21 ENCOUNTER — Other Ambulatory Visit: Payer: Self-pay | Admitting: Cardiology

## 2018-05-21 DIAGNOSIS — I10 Essential (primary) hypertension: Secondary | ICD-10-CM

## 2018-05-21 DIAGNOSIS — G4700 Insomnia, unspecified: Secondary | ICD-10-CM

## 2018-05-22 ENCOUNTER — Other Ambulatory Visit: Payer: Self-pay | Admitting: Cardiology

## 2018-05-23 ENCOUNTER — Other Ambulatory Visit: Payer: Self-pay | Admitting: Family Medicine

## 2018-05-23 LAB — BASIC METABOLIC PANEL
BUN/Creatinine Ratio: 10 (ref 9–23)
BUN: 10 mg/dL (ref 6–24)
CO2: 26 mmol/L (ref 20–29)
Calcium: 10.3 mg/dL — ABNORMAL HIGH (ref 8.7–10.2)
Chloride: 102 mmol/L (ref 96–106)
Creatinine, Ser: 1.01 mg/dL — ABNORMAL HIGH (ref 0.57–1.00)
GFR calc Af Amer: 73 mL/min/{1.73_m2} (ref >59)
GFR, EST NON AFRICAN AMERICAN: 64 mL/min/{1.73_m2} (ref >59)
Glucose: 89 mg/dL (ref 65–99)
POTASSIUM: 4.3 mmol/L (ref 3.5–5.2)
Sodium: 143 mmol/L (ref 134–144)

## 2018-05-23 NOTE — Telephone Encounter (Signed)
Requesting: Zolpidem Contract: Yes UDS: No Last OV: 03/11/2018 Next OV: N/A Last Refill: 02/18/2018, #30-1 rf Database:   Please advise

## 2018-05-24 NOTE — Progress Notes (Signed)
Please let patient know that labs are normal. Kidney function improved compared to previous results.

## 2018-05-27 ENCOUNTER — Ambulatory Visit: Payer: Self-pay | Admitting: Cardiology

## 2018-05-27 NOTE — Progress Notes (Signed)
Called Pt and left voicemail for callback.

## 2018-05-27 NOTE — Progress Notes (Signed)
Ok we will monitor and further discuss at her follow up.

## 2018-05-27 NOTE — Telephone Encounter (Signed)
Pt still has swelling in both ankles. Please advise

## 2018-05-27 NOTE — Progress Notes (Signed)
Called Pt and let her know.  Pt is still having concerns with spironolactone regarding swelling, stating it's not as bad as before, but still present. Mostly in feet and ankles, Pt wanted to let you know that this is still happening.

## 2018-05-27 NOTE — Telephone Encounter (Signed)
I have not seen the patient before. Is she taking Aldactone and HCTZ still?

## 2018-06-13 ENCOUNTER — Telehealth: Payer: Self-pay

## 2018-06-16 NOTE — Telephone Encounter (Signed)
Will leave it as is for now. Avoid salt and keep foot elevated and wear support stockings ([facility] has it for $10) during day.

## 2018-06-18 ENCOUNTER — Ambulatory Visit: Payer: Self-pay | Admitting: Cardiology

## 2018-07-18 ENCOUNTER — Encounter: Payer: Self-pay | Admitting: Cardiology

## 2018-07-18 ENCOUNTER — Ambulatory Visit (INDEPENDENT_AMBULATORY_CARE_PROVIDER_SITE_OTHER): Payer: 59 | Admitting: Cardiology

## 2018-07-18 VITALS — Ht 64.0 in | Wt 252.0 lb

## 2018-07-18 DIAGNOSIS — R0789 Other chest pain: Secondary | ICD-10-CM | POA: Diagnosis not present

## 2018-07-18 DIAGNOSIS — I10 Essential (primary) hypertension: Secondary | ICD-10-CM

## 2018-07-18 DIAGNOSIS — R002 Palpitations: Secondary | ICD-10-CM | POA: Diagnosis not present

## 2018-07-18 DIAGNOSIS — R600 Localized edema: Secondary | ICD-10-CM | POA: Diagnosis not present

## 2018-07-18 MED ORDER — SPIRONOLACTONE-HCTZ 25-25 MG PO TABS: 1.0000 | ORAL_TABLET | Freq: Every day | ORAL | 1 refills | Status: DC

## 2018-07-18 NOTE — Progress Notes (Signed)
Virtual Visit via Video Note: This visit type was conducted due to national recommendations for restrictions regarding the COVID-19 Pandemic (e.g. social distancing).  This format is felt to be most appropriate for this patient at this time.  All issues noted in this document were discussed and addressed.  No physical exam was performed (except for noted visual exam findings with Telehealth visits).  The patient has consented to conduct a Telehealth visit and understands insurance will be billed.   I connected with@, on 07/18/18 at  by a video enabled telemedicine application and verified that I am speaking with the correct person using two identifiers.   I discussed the limitations of evaluation and management by telemedicine and the availability of in person appointments. The patient expressed understanding and agreed to proceed.   I have discussed with patient regarding the safety during COVID Pandemic and steps and precautions to be taken including social distancing, frequent hand wash and use of detergent soap, gels with the patient. I asked the patient to avoid touching mouth, nose, eyes, ears with the hands. I encouraged regular walking around the neighborhood and exercise and regular diet, as long as social distancing can be maintained.  Primary Physician/Referring:  Ann Held, DO  Patient ID: Kelly [name], female    DOB: Mar 04, 1966, 53 y.o.   MRN: [id number]  Chief Complaint  Patient presents with  . Edema  . Follow-up    HPI: Kelly [name]  is a 53 y.o. female  with chronic leg edema, history of palpitations in the past that are stable and felt to be due to PVC and PAC, I had seen her in 2016, referred back to me for evaluation of worsening leg edema which is not responding to diuretics. No pain in her legs except at the end of the day due to heavyness.  Also C/O chest discomfort lasting a few minutes unrelated to activity, on her last office visit I felt that this was  probably noncardiac.  I'll set her up for a routine treadmill stress test however it was not performed due to Covid 19.  She had lost weight after Lap Band surgery. Unfortunately has gained her weight back. No dyspnea.   Past Medical History:  Diagnosis Date  . Anxiety   . B12 deficiency   . Breast cyst   . Depression with anxiety   . Edema   . Heart murmur   . Herpes   . History of hiatal hernia    repaur 2011   . Hypothyroidism   . Joint pain   . Menopause   . Morbid obesity ([place]) 2009  . Ovarian cyst   . PAC (premature atrial contraction)   . Pedunculated colonic polyp 03/04/15   tubulovillous adenoma of transverse colon.   . Pneumonia    hx of as a child   . PONV (postoperative nausea and vomiting)   . PVC (premature ventricular contraction)   . Thyroid goiter   . Uterine fibroid   . Vitamin D deficiency     Past Surgical History:  Procedure Laterality Date  . LAPAROSCOPIC GASTRIC BANDING  05-23-09   dr Abran Cantor with hiatal hernia repair.   Marland Kitchen LAPAROSCOPIC RIGHT HEMI COLECTOMY Right 03/25/2015   Procedure: LAPAROSCOPIC ASSISTED RIGHT HEMI COLECTOMY, with removal of lap band fluid prior to prepping.;  Surgeon: Alphonsa Overall, MD;  Location: [facility];  Service: General;  Laterality: Right;  . LIPOSUCTION  1995    Social History   Socioeconomic History  .  Marital status: Single    Spouse name: Not on file  . Number of children: 0  . Years of education: Not on file  . Highest education level: Not on file  Occupational History  . Occupation: CMA    Comment: CCM    Social Needs  . Financial resource strain: Not on file  . Food insecurity:    Worry: Not on file    Inability: Not on file  . Transportation needs:    Medical: Not on file    Non-medical: Not on file  Tobacco Use  . Smoking status: Never Smoker  . Smokeless tobacco: Never Used  Substance and Sexual Activity  . Alcohol use: Yes    Alcohol/week: 3.0 standard drinks    Types: 3 Glasses of wine per  week    Comment: socially  . Drug use: No  . Sexual activity: Never  Lifestyle  . Physical activity:    Days per week: 0 days    Minutes per session: 0 min  . Stress: Not on file  Relationships  . Social connections:    Talks on phone: Not on file    Gets together: Not on file    Attends religious service: Not on file    Active member of club or organization: Not on file    Attends meetings of clubs or organizations: Not on file    Relationship status: Not on file  . Intimate partner violence:    Fear of current or ex partner: Not on file    Emotionally abused: Not on file    Physically abused: Not on file    Forced sexual activity: Not on file  Other Topics Concern  . Not on file  Social History Narrative  . Not on file    Current Outpatient Medications on File Prior to Visit  Medication Sig Dispense Refill  . ALPRAZolam (XANAX) 0.25 MG tablet Take 1 tablet (0.25 mg total) by mouth 3 (three) times daily as needed for anxiety. 90 tablet 0  . cetirizine (ZYRTEC) 10 MG tablet Take 1 tablet (10 mg total) by mouth daily. 30 tablet 11  . levothyroxine (SYNTHROID, LEVOTHROID) 88 MCG tablet TAKE 1 TABLET BY MOUTH ONCE DAILY BEFORE BREAKFAST 90 tablet 1  . naproxen sodium (ALEVE) 220 MG tablet Take 220 mg by mouth as needed.    . traMADol (ULTRAM) 50 MG tablet TAKE 1 TO 2 TABLETS BY MOUTH ONCE DAILY 60 tablet 0  . valACYclovir (VALTREX) 1000 MG tablet Take 500 mg by mouth daily.     Marland Kitchen zolpidem (AMBIEN) 5 MG tablet TAKE 1 TABLET BY MOUTH ONCE DAILY AT BEDTIME AS NEEDED FOR SLEEP 30 tablet 0   No current facility-administered medications on file prior to visit.     Review of Systems  Constitution: Negative for chills, decreased appetite, malaise/fatigue and weight gain.  Cardiovascular: Positive for leg swelling. Negative for dyspnea on exertion and syncope.  Endocrine: Negative for cold intolerance.  Hematologic/Lymphatic: Does not bruise/bleed easily.  Musculoskeletal: Negative  for joint swelling.  Gastrointestinal: Negative for abdominal pain, anorexia and change in bowel habit.  Neurological: Negative for headaches and light-headedness.  Psychiatric/Behavioral: Negative for depression and substance abuse.  All other systems reviewed and are negative.     Objective:  Height 5\' 4"  (1.626 m), weight 252 lb (114.3 kg). Body mass index is 43.26 kg/m. Physical examination was not performed as it was a virtual visit. Prior examination is as below.  Physical Exam  Constitutional: She  appears well-developed. No distress.  Morbidly obese  HENT:  Head: Atraumatic.  Eyes: Conjunctivae are normal.  Neck: Neck supple. No thyromegaly present.  Short neck and difficult to evaluate JVP  Cardiovascular: Normal rate, regular rhythm, normal heart sounds, intact distal pulses and normal pulses. Exam reveals no gallop.  No murmur heard. Pulses:      Carotid pulses are 2+ on the right side and 2+ on the left side.      Dorsalis pedis pulses are 2+ on the right side and 2+ on the left side.       Posterior tibial pulses are 2+ on the right side and 2+ on the left side.  Pulmonary/Chest: Effort normal and breath sounds normal.  Abdominal: Soft. Bowel sounds are normal.  Obese. Pannus present  Musculoskeletal: Normal range of motion.        General: Edema (2+) present.  Neurological: She is alert.  Skin: Skin is warm and dry.  Psychiatric: She has a normal mood and affect.   Radiology: No results found. Laboratory Examination:    CMP Latest Ref Rng & Units 05/22/2018 05/07/2018 12/18/2017  Glucose 65 - 99 mg/dL 89 88 93  BUN 6 - 24 mg/dL 10 9 10   Creatinine 0.57 - 1.00 mg/dL 1.01(H) 1.30(H) 0.98  Sodium 134 - 144 mmol/L 143 140 140  Potassium 3.5 - 5.2 mmol/L 4.3 4.0 4.6  Chloride 96 - 106 mmol/L 102 97 104  CO2 20 - 29 mmol/L 26 28 30   Calcium 8.7 - 10.2 mg/dL 10.3(H) 9.5 9.5  Total Protein 6.0 - 8.3 g/dL - - 7.1  Total Bilirubin 0.2 - 1.2 mg/dL - - 0.6  Alkaline  Phos 39 - 117 U/L - - 93  AST 0 - 37 U/L - - 10  ALT 0 - 35 U/L - - 7   CBC Latest Ref Rng & Units 12/18/2017 10/30/2017 06/03/2017  WBC 4.0 - 10.5 K/uL 6.4 8.0 6.4  Hemoglobin 12.0 - 15.0 g/dL 13.5 12.9 14.5  Hematocrit 36.0 - 46.0 % 40.3 38.5 42.7  Platelets 150.0 - 400.0 K/uL 324.0 303.0 334.0   Lipid Panel     Component Value Date/Time   CHOL 214 (H) 10/30/2017 0721   TRIG 96.0 10/30/2017 0721   HDL 66.80 10/30/2017 0721   CHOLHDL 3 10/30/2017 0721   VLDL 19.2 10/30/2017 0721   LDLCALC 128 (H) 10/30/2017 0721   HEMOGLOBIN A1C Lab Results  Component Value Date   HGBA1C 5.3 01/09/2018   MPG 94 07/27/2016   TSH Recent Labs    10/30/17 0721 12/18/17 0739  TSH 6.94* 0.47    Cardiac studies:   Echocardiogram 12/27/2017: Normal LV size, normal LV systolic function, EF 75-10%. No regional wall motion abnormality. Grade 1 diastolic dysfunction. Mild TR and mild pulmonary hypertension, PA pressure 34 mmHg. Possible bicuspid aortic valve.   Assessment:    Bilateral leg edema - Plan: spironolactone-hydrochlorothiazide (ALDACTAZIDE) 25-25 MG tablet  Essential hypertension - Plan: spironolactone-hydrochlorothiazide (ALDACTAZIDE) 25-25 MG tablet, Basic Metabolic Panel (BMET)  Palpitations  Non-cardiac chest pain  03/11/18: Sinus  Rhythm WITHIN NORMAL LIMITS  Recommendations:    Mrs. Nalaysia Manganiello is a 53 year old after making female with chronic leg edema, history of palpitations in the past,palpitations that are chronic and not bothered by this. Also C/O chest discomfort lasting a few minutes unrelated to activity.  I have advised her to use furosemide on a p.r.n. basis, I will continue Aldactone/hydrochlorothiazide to be taken on a daily basis.   Due  to worsening renal function, which patient states that it has happened to her in the past when she is on diuretics, we had reduce the dose to one half tablet daily but her edema has recurred.  She will want to proceed with  being on one tablet daily, advised her that we will certainly do this but she will need to be closely monitored with regard to renal function, BMP was ordered every 2 months 2 for stability.  I'd like to see her back in 6 months for follow-up.  With regard to treadmill stress test that was set up for atypical chest pain, (grade for now until Coumadin issues are resolved or if her symptoms get worse.  She does not appear to be too concerned about this.  She was prescribed support stockings,  She'll also have blood pressure check at that time.  She has previously has had lap band surgery, she still has LAP-BAND, I have encouraged her to revisit the situation.   Adrian Prows, MD, [facility] 07/18/2018, 10:01 AM [facility]. [place] Pager: [phone] Office: [phone] If no answer Cell [phone]

## 2018-08-07 ENCOUNTER — Other Ambulatory Visit: Payer: Self-pay | Admitting: Family Medicine

## 2018-08-07 DIAGNOSIS — G4700 Insomnia, unspecified: Secondary | ICD-10-CM

## 2018-08-07 NOTE — Telephone Encounter (Signed)
Last written: 05/23/18 Last ov: 03/11/18 Next ov: none Contract: none UDS: none

## 2018-08-20 ENCOUNTER — Other Ambulatory Visit: Payer: Self-pay | Admitting: Family Medicine

## 2018-08-22 ENCOUNTER — Other Ambulatory Visit: Payer: Self-pay

## 2018-08-22 ENCOUNTER — Ambulatory Visit (INDEPENDENT_AMBULATORY_CARE_PROVIDER_SITE_OTHER): Payer: 59 | Admitting: Family Medicine

## 2018-08-22 DIAGNOSIS — E039 Hypothyroidism, unspecified: Secondary | ICD-10-CM | POA: Diagnosis not present

## 2018-08-22 DIAGNOSIS — M1990 Unspecified osteoarthritis, unspecified site: Secondary | ICD-10-CM

## 2018-08-22 MED ORDER — LEVOTHYROXINE SODIUM 88 MCG PO TABS: ORAL_TABLET | ORAL | 5 refills | Status: DC

## 2018-08-22 MED ORDER — TRAMADOL HCL 50 MG PO TABS: ORAL_TABLET | ORAL | 0 refills | Status: DC

## 2018-08-24 ENCOUNTER — Encounter: Payer: Self-pay | Admitting: Family Medicine

## 2018-08-24 NOTE — Progress Notes (Signed)
Virtual Visit via Video Note  I connected with Kelly [name] on 08/22/18  at  3:00 PM EDT by a video enabled telemedicine application and verified that I am speaking with the correct person using two identifiers.  Location: Patient: home Provider: office    I discussed the limitations of evaluation and management by telemedicine and the availability of in person appointments. The patient expressed understanding and agreed to proceed.  History of Present Illness: Pt is home with no complaints.  She sees cardiology regularly Needs refills on her meds    Observations/Objective: .no vitals obained Pt in NAD No sob   Assessment and Plan: 1. Hypothyroidism, unspecified type Check labs Refill meds  - Thyroid Panel With TSH - levothyroxine (SYNTHROID) 88 MCG tablet; TAKE 1 TABLET BY MOUTH ONCE DAILY BEFORE BREAKFAST  Dispense: 30 tablet; Refill: 5  2. Arthritis Stable Refill meds  - traMADol (ULTRAM) 50 MG tablet; TAKE 1 TO 2 TABLETS BY MOUTH ONCE DAILY  Dispense: 60 tablet; Refill: 0   Follow Up Instructions:    I discussed the assessment and treatment plan with the patient. The patient was provided an opportunity to ask questions and all were answered. The patient agreed with the plan and demonstrated an understanding of the instructions.   The patient was advised to call back or seek an in-person evaluation if the symptoms worsen or if the condition fails to improve as anticipated.  I provided 15 minutes of non-face-to-face time during this encounter.   Ann Held, DO

## 2018-08-28 ENCOUNTER — Other Ambulatory Visit: Payer: Self-pay

## 2018-08-28 ENCOUNTER — Other Ambulatory Visit (INDEPENDENT_AMBULATORY_CARE_PROVIDER_SITE_OTHER): Payer: 59

## 2018-08-28 DIAGNOSIS — R002 Palpitations: Secondary | ICD-10-CM | POA: Diagnosis not present

## 2018-08-28 DIAGNOSIS — E039 Hypothyroidism, unspecified: Secondary | ICD-10-CM

## 2018-08-29 LAB — COMPREHENSIVE METABOLIC PANEL
ALT: 8 U/L (ref 0–35)
AST: 12 U/L (ref 0–37)
Albumin: 4 g/dL (ref 3.5–5.2)
Alkaline Phosphatase: 103 U/L (ref 39–117)
BUN: 10 mg/dL (ref 6–23)
CO2: 32 mEq/L (ref 19–32)
Calcium: 9.9 mg/dL (ref 8.4–10.5)
Chloride: 99 mEq/L (ref 96–112)
Creatinine, Ser: 1.03 mg/dL (ref 0.40–1.20)
GFR: 67.61 mL/min (ref >60.00)
Glucose, Bld: 89 mg/dL (ref 70–99)
Potassium: 4.2 mEq/L (ref 3.5–5.1)
Sodium: 140 mEq/L (ref 135–145)
Total Bilirubin: 0.8 mg/dL (ref 0.2–1.2)
Total Protein: 7.7 g/dL (ref 6.0–8.3)

## 2018-08-29 LAB — TSH: TSH: 0.72 u[IU]/mL (ref 0.35–4.50)

## 2018-08-29 LAB — LIPID PANEL
Cholesterol: 229 mg/dL — ABNORMAL HIGH (ref 0–200)
HDL: 54.1 mg/dL (ref >39.00)
LDL Cholesterol: 138 mg/dL — ABNORMAL HIGH (ref 0–99)
NonHDL: 174.43
Total CHOL/HDL Ratio: 4
Triglycerides: 183 mg/dL — ABNORMAL HIGH (ref 0.0–149.0)
VLDL: 36.6 mg/dL (ref 0.0–40.0)

## 2018-09-03 ENCOUNTER — Other Ambulatory Visit: Payer: Self-pay | Admitting: Family Medicine

## 2018-09-03 DIAGNOSIS — E785 Hyperlipidemia, unspecified: Secondary | ICD-10-CM

## 2018-09-04 ENCOUNTER — Other Ambulatory Visit: Payer: Self-pay | Admitting: *Deleted

## 2018-09-04 DIAGNOSIS — E785 Hyperlipidemia, unspecified: Secondary | ICD-10-CM

## 2018-09-16 ENCOUNTER — Other Ambulatory Visit: Payer: Self-pay | Admitting: Family Medicine

## 2018-09-16 DIAGNOSIS — G4700 Insomnia, unspecified: Secondary | ICD-10-CM

## 2018-09-19 NOTE — Telephone Encounter (Signed)
Last zolpidem RX: 08/07/18, #30 Last OV: 08/22/18 Next OV: 12/16/18 UDS:  Not on file CSC:  Not on file

## 2018-09-30 ENCOUNTER — Other Ambulatory Visit: Payer: Self-pay

## 2018-09-30 MED ORDER — LEVOTHYROXINE SODIUM 88 MCG PO TABS: 88.0000 ug | ORAL_TABLET | Freq: Every day | ORAL | 4 refills | Status: DC

## 2018-10-20 ENCOUNTER — Other Ambulatory Visit: Payer: Self-pay

## 2018-10-20 DIAGNOSIS — Z20822 Contact with and (suspected) exposure to covid-19: Secondary | ICD-10-CM

## 2018-10-21 ENCOUNTER — Other Ambulatory Visit: Payer: Self-pay | Admitting: Family Medicine

## 2018-10-21 DIAGNOSIS — G4700 Insomnia, unspecified: Secondary | ICD-10-CM

## 2018-10-21 DIAGNOSIS — R000 Tachycardia, unspecified: Secondary | ICD-10-CM

## 2018-10-21 DIAGNOSIS — R002 Palpitations: Secondary | ICD-10-CM

## 2018-10-22 LAB — NOVEL CORONAVIRUS, NAA: SARS-CoV-2, NAA: NOT DETECTED

## 2018-10-22 NOTE — Telephone Encounter (Signed)
Requesting: Xanax Contract: 07/09/2013 UDS: N/A Last OV: 08/22/2018 Next OV: 12/16/2018 Last Refill: 10/29/2017, #90--0 RF Database:  Requesting: Ambien Contract: UDS: Last OV: Next OV: Last Refill: 09/19/2018, #30--0 RF Database:   Please advise

## 2018-11-12 ENCOUNTER — Other Ambulatory Visit: Payer: Self-pay

## 2018-11-12 DIAGNOSIS — Z20822 Contact with and (suspected) exposure to covid-19: Secondary | ICD-10-CM

## 2018-11-14 LAB — NOVEL CORONAVIRUS, NAA: SARS-CoV-2, NAA: NOT DETECTED

## 2018-12-01 ENCOUNTER — Other Ambulatory Visit: Payer: Self-pay | Admitting: Family Medicine

## 2018-12-01 DIAGNOSIS — G4700 Insomnia, unspecified: Secondary | ICD-10-CM

## 2018-12-02 NOTE — Telephone Encounter (Signed)
Requesting: Ambien Contract: 07/09/2013 UDS: N/A Last OV: 08/22/2018 Next OV: 12/16/2018 Last Refill:  Database: 10/22/2018   Please advise

## 2018-12-12 ENCOUNTER — Other Ambulatory Visit: Payer: 59

## 2018-12-15 ENCOUNTER — Other Ambulatory Visit: Payer: Self-pay

## 2018-12-15 ENCOUNTER — Other Ambulatory Visit (INDEPENDENT_AMBULATORY_CARE_PROVIDER_SITE_OTHER): Payer: 59

## 2018-12-15 DIAGNOSIS — E785 Hyperlipidemia, unspecified: Secondary | ICD-10-CM

## 2018-12-15 LAB — COMPREHENSIVE METABOLIC PANEL
ALT: 7 U/L (ref 0–35)
AST: 12 U/L (ref 0–37)
Albumin: 3.9 g/dL (ref 3.5–5.2)
Alkaline Phosphatase: 100 U/L (ref 39–117)
BUN: 10 mg/dL (ref 6–23)
CO2: 29 mEq/L (ref 19–32)
Calcium: 9.3 mg/dL (ref 8.4–10.5)
Chloride: 100 mEq/L (ref 96–112)
Creatinine, Ser: 0.93 mg/dL (ref 0.40–1.20)
GFR: 75.98 mL/min (ref >60.00)
Glucose, Bld: 72 mg/dL (ref 70–99)
Potassium: 3.4 mEq/L — ABNORMAL LOW (ref 3.5–5.1)
Sodium: 140 mEq/L (ref 135–145)
Total Bilirubin: 1.1 mg/dL (ref 0.2–1.2)
Total Protein: 7.5 g/dL (ref 6.0–8.3)

## 2018-12-15 LAB — LIPID PANEL
Cholesterol: 177 mg/dL (ref 0–200)
HDL: 50.7 mg/dL (ref >39.00)
LDL Cholesterol: 112 mg/dL — ABNORMAL HIGH (ref 0–99)
NonHDL: 126.16
Total CHOL/HDL Ratio: 3
Triglycerides: 70 mg/dL (ref 0.0–149.0)
VLDL: 14 mg/dL (ref 0.0–40.0)

## 2018-12-16 ENCOUNTER — Encounter: Payer: Self-pay | Admitting: Family Medicine

## 2018-12-16 ENCOUNTER — Ambulatory Visit (INDEPENDENT_AMBULATORY_CARE_PROVIDER_SITE_OTHER): Payer: 59 | Admitting: Family Medicine

## 2018-12-16 VITALS — BP 110/68 | HR 103 | Temp 97.9°F | Resp 18 | Ht 64.0 in | Wt 262.8 lb

## 2018-12-16 DIAGNOSIS — K635 Polyp of colon: Secondary | ICD-10-CM

## 2018-12-16 DIAGNOSIS — Z0000 Encounter for general adult medical examination without abnormal findings: Secondary | ICD-10-CM

## 2018-12-16 DIAGNOSIS — Z23 Encounter for immunization: Secondary | ICD-10-CM

## 2018-12-16 NOTE — Progress Notes (Signed)
Subjective:     Kelly [name] is a 53 y.o. female and is here for a comprehensive physical exam. The patient reports no problems.  Social History   Socioeconomic History  . Marital status: Single    Spouse name: Not on file  . Number of children: 0  . Years of education: Not on file  . Highest education level: Not on file  Occupational History  . Occupation: CMA    Comment: CCM    Social Needs  . Financial resource strain: Not on file  . Food insecurity    Worry: Not on file    Inability: Not on file  . Transportation needs    Medical: Not on file    Non-medical: Not on file  Tobacco Use  . Smoking status: Never Smoker  . Smokeless tobacco: Never Used  Substance and Sexual Activity  . Alcohol use: Yes    Alcohol/week: 3.0 standard drinks    Types: 3 Glasses of wine per week    Comment: socially  . Drug use: No  . Sexual activity: Never  Lifestyle  . Physical activity    Days per week: 0 days    Minutes per session: 0 min  . Stress: Not on file  Relationships  . Social Herbalist on phone: Not on file    Gets together: Not on file    Attends religious service: Not on file    Active member of club or organization: Not on file    Attends meetings of clubs or organizations: Not on file    Relationship status: Not on file  . Intimate partner violence    Fear of current or ex partner: Not on file    Emotionally abused: Not on file    Physically abused: Not on file    Forced sexual activity: Not on file  Other Topics Concern  . Not on file  Social History Narrative  . Not on file   Health Maintenance  Topic Date Due  . MAMMOGRAM  02/18/2018  . COLONOSCOPY  03/03/2018  . PAP SMEAR-Modifier  02/12/2020  . TETANUS/TDAP  06/01/2027  . INFLUENZA VACCINE  Completed  . HIV Screening  Completed    The following portions of the patient's history were reviewed and updated as appropriate:  She  has a past medical history of Anxiety, B12 deficiency, Breast  cyst, Depression with anxiety, Edema, Heart murmur, Herpes, History of hiatal hernia, Hypothyroidism, Joint pain, Menopause, Morbid obesity ([place]) (2009), Ovarian cyst, PAC (premature atrial contraction), Pedunculated colonic polyp (03/04/15), Pneumonia, PONV (postoperative nausea and vomiting), PVC (premature ventricular contraction), Thyroid goiter, Uterine fibroid, and Vitamin D deficiency. She does not have any pertinent problems on file. She  has a past surgical history that includes Laparoscopic gastric banding (05-23-09); Liposuction (1995); and Laparoscopic right hemi colectomy (Right, 03/25/2015). Her family history includes Alzheimer's disease in her paternal grandfather; Aneurysm (age of onset: 61) in her mother; Diabetes in her paternal aunt; Heart disease in her maternal grandfather; Heart disease (age of onset: 58) in her paternal grandmother; Hyperlipidemia in her father and mother; Hypertension in her father, maternal grandmother, mother, and paternal grandmother; Obesity in her father; Stroke in her mother; Thyroid disease in her mother. She  reports that she has never smoked. She has never used smokeless tobacco. She reports current alcohol use of about 3.0 standard drinks of alcohol per week. She reports that she does not use drugs. She has a current medication list which includes  the following prescription(s): alprazolam, cetirizine, levothyroxine, naproxen sodium, spironolactone-hydrochlorothiazide, tramadol, valacyclovir, and zolpidem. Current Outpatient Medications on File Prior to Visit  Medication Sig Dispense Refill  . ALPRAZolam (XANAX) 0.25 MG tablet TAKE 1 TABLET BY MOUTH THREE TIMES DAILY AS NEEDED FOR ANXIETY 90 tablet 0  . cetirizine (ZYRTEC) 10 MG tablet Take 1 tablet (10 mg total) by mouth daily. 30 tablet 11  . levothyroxine (EUTHYROX) 88 MCG tablet Take 1 tablet (88 mcg total) by mouth daily before breakfast. 30 tablet 4  . naproxen sodium (ALEVE) 220 MG tablet Take 220 mg  by mouth as needed.    Marland Kitchen spironolactone-hydrochlorothiazide (ALDACTAZIDE) 25-25 MG tablet Take 1 tablet by mouth daily. 90 tablet 1  . traMADol (ULTRAM) 50 MG tablet TAKE 1 TO 2 TABLETS BY MOUTH ONCE DAILY 60 tablet 0  . valACYclovir (VALTREX) 1000 MG tablet Take 500 mg by mouth daily.     Marland Kitchen zolpidem (AMBIEN) 5 MG tablet TAKE 1 TABLET BY MOUTH ONCE DAILY AT BEDTIME AS NEEDED FOR SLEEP 30 tablet 0   No current facility-administered medications on file prior to visit.    She is allergic to other..  Review of Systems Review of Systems  Constitutional: Negative for activity change, appetite change and fatigue.  HENT: Negative for hearing loss, congestion, tinnitus and ear discharge.  dentist q100m Eyes: Negative for visual disturbance (see optho q1y -- vision corrected to 20/20 with glasses).  Respiratory: Negative for cough, chest tightness and shortness of breath.   Cardiovascular: Negative for chest pain, palpitations and leg swelling.  Gastrointestinal: Negative for abdominal pain, diarrhea, constipation and abdominal distention.  Genitourinary: Negative for urgency, frequency, decreased urine volume and difficulty urinating.  Musculoskeletal: Negative for back pain, arthralgias and gait problem.  Skin: Negative for color change, pallor and rash.  Neurological: Negative for dizziness, light-headedness, numbness and headaches.  Hematological: Negative for adenopathy. Does not bruise/bleed easily.  Psychiatric/Behavioral: Negative for suicidal ideas, confusion, sleep disturbance, self-injury, dysphoric mood, decreased concentration and agitation.        Objective:    BP 110/68 (BP Location: Right Arm, Patient Position: Sitting, Cuff Size: Normal)   Pulse (!) 103   Temp 97.9 F (36.6 C) (Temporal)   Resp 18   Ht 5\' 4"  (1.626 m)   Wt 262 lb 12.8 oz (119.2 kg)   SpO2 96%   BMI 45.11 kg/m  General appearance: alert, cooperative, appears stated age and no distress Head:  Normocephalic, without obvious abnormality, atraumatic Eyes: negative findings: lids and lashes normal, conjunctivae and sclerae normal and pupils equal, round, reactive to light and accomodation Ears: normal TM's and external ear canals both ears Nose: Nares normal. Septum midline. Mucosa normal. No drainage or sinus tenderness. Throat: lips, mucosa, and tongue normal; teeth and gums normal Neck: no adenopathy, no carotid bruit, no JVD, supple, symmetrical, trachea midline and thyroid not enlarged, symmetric, no tenderness/mass/nodules Back: symmetric, no curvature. ROM normal. No CVA tenderness. Lungs: clear to auscultation bilaterally Breasts: gyn Heart: regular rate and rhythm, S1, S2 normal, no murmur, click, rub or gallop Abdomen: soft, non-tender; bowel sounds normal; no masses,  no organomegaly Pelvic: deferred--gyn Extremities: extremities normal, atraumatic, no cyanosis or edema Pulses: 2+ and symmetric Skin: Skin color, texture, turgor normal. No rashes or lesions Lymph nodes: Cervical, supraclavicular, and axillary nodes normal. Neurologic: Alert and oriented X 3, normal strength and tone. Normal symmetric reflexes. Normal coordination and gait    Assessment:    Healthy female exam.  Plan:    ghm utd  Check labs  See After Visit Summary for Counseling Recommendations    1. Need for influenza vaccination  - Flu Vaccine QUAD 6+ mos PF IM (Fluarix Quad PF)  2. Polyp of colon, unspecified part of colon, unspecified type  - Ambulatory referral to Gastroenterology  3. Need for shingles vaccine - Varicella-zoster vaccine IM

## 2018-12-16 NOTE — Patient Instructions (Signed)

## 2018-12-16 NOTE — Assessment & Plan Note (Signed)
Discussed weight loss / exercise 

## 2018-12-22 ENCOUNTER — Other Ambulatory Visit: Payer: Self-pay | Admitting: *Deleted

## 2018-12-22 MED ORDER — POTASSIUM CHLORIDE CRYS ER 20 MEQ PO TBCR: 20.0000 meq | EXTENDED_RELEASE_TABLET | Freq: Every day | ORAL | 2 refills | Status: DC

## 2019-01-14 ENCOUNTER — Other Ambulatory Visit: Payer: Self-pay | Admitting: Family Medicine

## 2019-01-14 DIAGNOSIS — G4700 Insomnia, unspecified: Secondary | ICD-10-CM

## 2019-01-14 NOTE — Telephone Encounter (Signed)
Requesting: Ambien Contract: 07/09/2013 UDS: 07/10/2013 Last OV: 12/16/2018 Next OV: 06/23/19  Last Refill: 12/02/2018, #30--0 RF Database:   Please advise

## 2019-01-23 ENCOUNTER — Ambulatory Visit: Payer: PRIVATE HEALTH INSURANCE | Admitting: Cardiology

## 2019-02-13 ENCOUNTER — Other Ambulatory Visit: Payer: Self-pay | Admitting: Family Medicine

## 2019-02-13 DIAGNOSIS — G4700 Insomnia, unspecified: Secondary | ICD-10-CM

## 2019-02-13 NOTE — Telephone Encounter (Signed)
Last Zolpidem RX:  01/15/19, #30 Last OV:  12/16/18 Next OV:  06/23/19 UDS:  Not on file CSC:  Not on file

## 2019-03-11 ENCOUNTER — Other Ambulatory Visit: Payer: Self-pay | Admitting: Family Medicine

## 2019-03-11 DIAGNOSIS — M1990 Unspecified osteoarthritis, unspecified site: Secondary | ICD-10-CM

## 2019-03-11 NOTE — Telephone Encounter (Signed)
Last refill 08/22/2018  #60 no refills Last OV 12/16/2018 Next Ov  06/23/2019 No UDS Last Kelly [name] 07/09/2013

## 2019-03-12 ENCOUNTER — Other Ambulatory Visit: Payer: Self-pay | Admitting: Family Medicine

## 2019-03-12 DIAGNOSIS — M1990 Unspecified osteoarthritis, unspecified site: Secondary | ICD-10-CM

## 2019-03-12 MED ORDER — TRAMADOL HCL 50 MG PO TABS: ORAL_TABLET | ORAL | 0 refills | Status: DC

## 2019-03-12 NOTE — Telephone Encounter (Signed)
I'll send one in but we need contract and uds

## 2019-03-16 ENCOUNTER — Other Ambulatory Visit: Payer: Self-pay | Admitting: Family Medicine

## 2019-03-16 DIAGNOSIS — G4700 Insomnia, unspecified: Secondary | ICD-10-CM

## 2019-03-17 NOTE — Telephone Encounter (Signed)
Requesting: Ambien Contract: 07/09/2013 UDS: N/A Last OV: 12/16/2018 Next OV: 06/23/2019 Last Refill: 02/13/2019, #30--0 RF  Database:   Please advise

## 2019-03-29 ENCOUNTER — Telehealth: Payer: 59 | Admitting: Physician Assistant

## 2019-03-29 DIAGNOSIS — Z20822 Contact with and (suspected) exposure to covid-19: Secondary | ICD-10-CM

## 2019-03-29 NOTE — Progress Notes (Signed)
I have spent 5 minutes in review of e-visit questionnaire, review and updating patient chart, medical decision making and response to patient.   Kasean Denherder Cody Nihira Puello, PA-C    

## 2019-03-29 NOTE — Progress Notes (Signed)
E-Visit for Corona Virus Screening   Your current symptoms could be consistent with the coronavirus.  Many health care providers can now test patients at their office but not all are.  [place] has multiple testing sites. For information on our [place] testing locations and hours go to [email or web address]  We are enrolling you in our [place] for [place] . Daily you will receive a questionnaire within the [facility] website. Our COVID 19 response team will be monitoring your responses daily.  Testing Information: The COVID-19 Community Testing sites will begin testing BY APPOINTMENT ONLY.  You can schedule online at [email or web address]  If you do not have access to a smart phone or computer you may call [phone] for an appointment.  Testing Locations: Appointment schedule is 8 am to 3:30 pm at all sites  [facility] indoors at [address], [place] [address] [facility]  indoors at [place]. [address] [place], [place] [address] [place] indoors at [address] [place] [address]  Additional testing sites in the Community:  . For [facility] Testing sites in [facility]  FaceUpdate.uy  . For Pop-up testing sites in [place]  BowlDirectory.co.uk  . For Testing sites with regular hours [email or web address]  . For [place] [email or web address]  . For [facility] [email or web address]  . For [facility] testing in [place] and [employer] [email or web address]  . For [facility] testing in [facility]   [email or web address]  For  more  information about community testing call 6504205772   We are enrolling you in our [place] for [place] . Daily you will receive a questionnaire within the [place] website. Our COVID 19 response team will be monitoring your responses daily.  Please quarantine yourself while awaiting your test results. If you develop fever/cough/breathlessness, please stay home for 10 days with improving symptoms and until you have had 24 hours of no fever (without taking a fever reducer).  You should wear a mask or cloth face covering over your nose and mouth if you must be around other people or animals, including pets (even at home). Try to stay at least 6 feet away from other people. This will protect the people around you.  Please continue good preventive care measures, including:  frequent hand-washing, avoid touching your face, cover coughs/sneezes, stay out of crowds and keep a 6 foot distance from others.  COVID-19 is a respiratory illness with symptoms that are similar to the flu. Symptoms are typically mild to moderate, but there have been cases of severe illness and death due to the virus.   The following symptoms may appear 2-14 days after exposure: . Fever . Cough . Shortness of breath or difficulty breathing . Chills . Repeated shaking with chills . Muscle pain . Headache . Sore throat . New loss of taste or smell . Fatigue . Congestion or runny nose . Nausea or vomiting . Diarrhea  Go to the nearest hospital ED for assessment if fever/cough/breathlessness are severe or illness seems like a threat to life.  It is vitally important that if you feel that you have an infection such as this virus or any other virus that you stay home and away from places where you may spread it to others.  You should avoid contact with people age 22 and older.    You may also take acetaminophen (Tylenol) as needed for fever.  Reduce your risk of any infection by using the same precautions used for  avoiding the common cold or flu:  Marland Kitchen Wash your hands often with soap and warm water for at least 20 seconds.  If soap and water are not readily available, use an alcohol-based hand sanitizer with at least 60% alcohol.  . If coughing or sneezing, cover your mouth and nose by coughing or sneezing into the elbow areas of your shirt or coat, into a tissue or into your sleeve (not your hands). . Avoid shaking hands with others and consider head nods or verbal greetings only. . Avoid touching your eyes, nose, or mouth with unwashed hands.  . Avoid close contact with people who are sick. . Avoid places or events with large numbers of people in one location, like concerts or sporting events. . Carefully consider travel plans you have or are making. . If you are planning any travel outside or inside the [place], visit the CDC's Travelers' Health webpage for the latest health notices. . If you have some symptoms but not all symptoms, continue to monitor at home and seek medical attention if your symptoms worsen. . If you are having a medical emergency, call 911.  HOME CARE . Only take medications as instructed by your medical team. . Drink plenty of fluids and get plenty of rest. . A steam or ultrasonic humidifier can help if you have congestion.   GET HELP RIGHT AWAY IF YOU HAVE EMERGENCY WARNING SIGNS** FOR COVID-19. If you or someone is showing any of these signs seek emergency medical care immediately. Call 911 or proceed to your closest emergency facility if: . You develop worsening high fever. . Trouble breathing . Bluish lips or face . Persistent pain or pressure in the chest . New confusion . Inability to wake or stay awake . You cough up blood. . Your symptoms become more severe  **This list is not all possible symptoms. Contact your medical provider for any symptoms that are sever or concerning to you.  MAKE SURE YOU   Understand these instructions.  Will watch your condition.  Will get  help right away if you are not doing well or get worse.  Your e-visit answers were reviewed by a board certified advanced clinical practitioner to complete your personal care plan.  Depending on the condition, your plan could have included both over the counter or prescription medications.  If there is a problem please reply once you have received a response from your provider.  Your safety is important to [place].  If you have drug allergies check your prescription carefully.    You can use MyChart to ask questions about today's visit, request a non-urgent call back, or ask for a work or school excuse for 24 hours related to this e-Visit. If it has been greater than 24 hours you will need to follow up with your provider, or enter a new e-Visit to address those concerns. You will get an e-mail in the next two days asking about your experience.  I hope that your e-visit has been valuable and will speed your recovery. Thank you for using e-visits.

## 2019-03-31 ENCOUNTER — Ambulatory Visit: Payer: 59 | Attending: Internal Medicine

## 2019-03-31 DIAGNOSIS — Z20822 Contact with and (suspected) exposure to covid-19: Secondary | ICD-10-CM

## 2019-04-01 LAB — NOVEL CORONAVIRUS, NAA: SARS-CoV-2, NAA: DETECTED — AB

## 2019-04-03 ENCOUNTER — Encounter (INDEPENDENT_AMBULATORY_CARE_PROVIDER_SITE_OTHER): Payer: Self-pay

## 2019-04-05 ENCOUNTER — Encounter (INDEPENDENT_AMBULATORY_CARE_PROVIDER_SITE_OTHER): Payer: Self-pay

## 2019-04-06 ENCOUNTER — Encounter (INDEPENDENT_AMBULATORY_CARE_PROVIDER_SITE_OTHER): Payer: Self-pay

## 2019-04-07 ENCOUNTER — Encounter (INDEPENDENT_AMBULATORY_CARE_PROVIDER_SITE_OTHER): Payer: Self-pay

## 2019-04-08 ENCOUNTER — Encounter (INDEPENDENT_AMBULATORY_CARE_PROVIDER_SITE_OTHER): Payer: Self-pay

## 2019-04-11 ENCOUNTER — Encounter (INDEPENDENT_AMBULATORY_CARE_PROVIDER_SITE_OTHER): Payer: Self-pay

## 2019-04-14 ENCOUNTER — Other Ambulatory Visit: Payer: Self-pay | Admitting: Family Medicine

## 2019-04-20 ENCOUNTER — Encounter: Payer: Self-pay | Admitting: *Deleted

## 2019-04-20 NOTE — Telephone Encounter (Signed)
Patient returned call- she states she is doing better. She states last week she was fatigued- but has had improvement. Patient is glad we have been following her and she is thankful for her improvement.

## 2019-04-21 ENCOUNTER — Other Ambulatory Visit: Payer: Self-pay | Admitting: Obstetrics and Gynecology

## 2019-04-21 DIAGNOSIS — R928 Other abnormal and inconclusive findings on diagnostic imaging of breast: Secondary | ICD-10-CM

## 2019-04-29 ENCOUNTER — Ambulatory Visit
Admission: RE | Admit: 2019-04-29 | Discharge: 2019-04-29 | Disposition: A | Discharge status: Home / Self Care | Payer: 59 | Source: Ambulatory Visit | Attending: Obstetrics and Gynecology | Admitting: Obstetrics and Gynecology

## 2019-04-29 ENCOUNTER — Other Ambulatory Visit: Payer: Self-pay

## 2019-04-29 ENCOUNTER — Other Ambulatory Visit: Payer: Self-pay | Admitting: Cardiology

## 2019-04-29 DIAGNOSIS — R600 Localized edema: Secondary | ICD-10-CM

## 2019-04-29 DIAGNOSIS — I10 Essential (primary) hypertension: Secondary | ICD-10-CM

## 2019-04-29 DIAGNOSIS — R928 Other abnormal and inconclusive findings on diagnostic imaging of breast: Secondary | ICD-10-CM

## 2019-05-04 ENCOUNTER — Other Ambulatory Visit: Payer: Self-pay | Admitting: Family Medicine

## 2019-05-04 DIAGNOSIS — G4700 Insomnia, unspecified: Secondary | ICD-10-CM

## 2019-05-05 NOTE — Telephone Encounter (Signed)
Last zolpidem RX:  03/16/09, #30 Last OV:   12/16/18 Next OV:  06/24/19 UDS:  Past due CSC:  Past due

## 2019-06-12 ENCOUNTER — Other Ambulatory Visit: Payer: Self-pay

## 2019-06-15 ENCOUNTER — Other Ambulatory Visit: Payer: Self-pay

## 2019-06-15 ENCOUNTER — Ambulatory Visit: Payer: 59 | Admitting: Family Medicine

## 2019-06-15 ENCOUNTER — Encounter: Payer: Self-pay | Admitting: Family Medicine

## 2019-06-15 ENCOUNTER — Other Ambulatory Visit: Payer: Self-pay | Admitting: Cardiology

## 2019-06-15 VITALS — BP 142/71 | HR 80 | Temp 96.4°F | Resp 17 | Ht 64.0 in | Wt 266.0 lb

## 2019-06-15 DIAGNOSIS — R5383 Other fatigue: Secondary | ICD-10-CM | POA: Diagnosis not present

## 2019-06-15 DIAGNOSIS — E8881 Metabolic syndrome: Secondary | ICD-10-CM

## 2019-06-15 DIAGNOSIS — R000 Tachycardia, unspecified: Secondary | ICD-10-CM

## 2019-06-15 DIAGNOSIS — R002 Palpitations: Secondary | ICD-10-CM | POA: Diagnosis not present

## 2019-06-15 DIAGNOSIS — R600 Localized edema: Secondary | ICD-10-CM

## 2019-06-15 DIAGNOSIS — Z9884 Bariatric surgery status: Secondary | ICD-10-CM

## 2019-06-15 DIAGNOSIS — I10 Essential (primary) hypertension: Secondary | ICD-10-CM

## 2019-06-15 DIAGNOSIS — E039 Hypothyroidism, unspecified: Secondary | ICD-10-CM | POA: Diagnosis not present

## 2019-06-15 MED ORDER — ALPRAZOLAM 0.25 MG PO TABS: 0.2500 mg | ORAL_TABLET | Freq: Three times a day (TID) | ORAL | 0 refills | Status: DC | PRN

## 2019-06-15 NOTE — Patient Instructions (Signed)
Thyroid-Stimulating Hormone Test Why am I having this test? You may have a thyroid-stimulating hormone (TSH) test if you have possible symptoms of abnormal thyroid hormone levels. This test can help your health care provider:  Diagnose a disorder of the thyroid gland or pituitary gland.  Manage your condition and treatment if you have an underactive thyroid (hypothyroidism) or an overactive thyroid (hyperthyroidism). Newborn babies may have this test done to screen for hypothyroidism that is present at birth (congenital). The thyroid is a gland in the lower front of the neck. It makes hormones that affect many body parts and systems, including the system that affects how quickly the body burns fuel for energy (metabolism). The pituitary gland is located just below the brain, behind the eyes and nasal passages. It helps maintain thyroid hormone levels and thyroid gland function. What is being tested? This test measures the amount of TSH in your blood. TSH may also be called thyrotropin. When the thyroid does not make enough hormones, the pituitary gland releases TSH into the bloodstream to stimulate the thyroid gland to make more hormones. What kind of sample is taken?     A blood sample is required for this test. It is usually collected by inserting a needle into a blood vessel. For newborns, a small amount of blood may be collected from the umbilical cord, or by using a small needle to prick the baby's heel (heel stick). Tell a health care provider about:  All medicines you are taking, including vitamins, herbs, eye drops, creams, and over-the-counter medicines.  Any blood disorders you have.  Any surgeries you have had.  Any medical conditions you have.  Whether you are pregnant or may be pregnant. How are the results reported? Your test results will be reported as a value that indicates how much TSH is in your blood. Your health care provider will compare your results to normal ranges  that were established after testing a large group of people (reference ranges). Reference ranges may vary among labs and hospitals. For this test, common reference ranges are:  Adult: 2-10 microunits/mL or 2-10 milliunits/L.  Newborn: ? Heel stick: 3-18 microunits/mL or 3-18 milliunits/L. ? Umbilical cord: 1-94 microunits/mL or 3-12 milliunits/L. What do the results mean? Results that are within the reference range are considered normal. This means that you have a normal amount of TSH in your blood. Results that are higher than the reference range mean that your TSH levels are too high. This may mean:  Your thyroid gland is not making enough thyroid hormones.  Your thyroid medicine dosage is too low.  You have a tumor on your pituitary gland. This is rare. Results that are lower than the reference range mean that your TSH levels are too low. This may be caused by hyperthyroidism or by a problem with the pituitary gland function. Talk with your health care provider about what your results mean. Questions to ask your health care provider Ask your health care provider, or the department that is doing the test:  When will my results be ready?  How will I get my results?  What are my treatment options?  What other tests do I need?  What are my next steps? Summary  You may have a thyroid-stimulating hormone (TSH) test if you have possible symptoms of abnormal thyroid hormone levels.  The thyroid is a gland in the lower front of the neck. It makes hormones that affect many body parts and systems.  The pituitary gland is located  just below the brain, behind the eyes and nasal passages. It helps maintain thyroid hormone levels and thyroid gland function.  This test measures the amount of TSH in your blood. TSH is made by the pituitary gland. It may also be called thyrotropin. This information is not intended to replace advice given to you by your health care provider. Make sure you  discuss any questions you have with your health care provider. Document Revised: 06/10/2017 Document Reviewed: 11/13/2016 Elsevier Patient Education  2020 [employer] Inc.  

## 2019-06-15 NOTE — Progress Notes (Signed)
Patient ID: Kelly [name], female    DOB: [birth year]  Age: 54 y.o. MRN: [id number]    Subjective:  Subjective  HPI Kelly [name] presents for f/u and c/o palp and cp but has seen cardiology and everything was fine.  She will try to see surgery to discuss lap band and maybe having it removed.      Review of Systems  Constitutional: Negative for appetite change, diaphoresis, fatigue and unexpected weight change.  Eyes: Negative for pain, redness and visual disturbance.  Respiratory: Negative for cough, chest tightness, shortness of breath and wheezing.   Cardiovascular: Negative for chest pain, palpitations and leg swelling.  Endocrine: Negative for cold intolerance, heat intolerance, polydipsia, polyphagia and polyuria.  Genitourinary: Negative for difficulty urinating, dysuria and frequency.  Neurological: Negative for dizziness, light-headedness, numbness and headaches.    History Past Medical History:  Diagnosis Date  . Anxiety   . B12 deficiency   . Breast cyst   . Depression with anxiety   . Edema   . Heart murmur   . Herpes   . History of hiatal hernia    repaur 2011   . Hypothyroidism   . Joint pain   . Menopause   . Morbid obesity ([place]) 2009  . Ovarian cyst   . PAC (premature atrial contraction)   . Pedunculated colonic polyp 03/04/15   tubulovillous adenoma of transverse colon.   . Pneumonia    hx of as a child   . PONV (postoperative nausea and vomiting)   . PVC (premature ventricular contraction)   . Thyroid goiter   . Uterine fibroid   . Vitamin D deficiency     She has a past surgical history that includes Laparoscopic gastric banding (05-23-09); Liposuction (1995); and Laparoscopic right hemi colectomy (Right, 03/25/2015).   Her family history includes Alzheimer's disease in her paternal grandfather; Aneurysm (age of onset: 76) in her mother; Diabetes in her paternal aunt; Heart disease in her maternal grandfather; Heart disease (age of onset: 69) in her  paternal grandmother; Hyperlipidemia in her father and mother; Hypertension in her father, maternal grandmother, mother, and paternal grandmother; Obesity in her father; Stroke in her mother; Thyroid disease in her mother.She reports that she has never smoked. She has never used smokeless tobacco. She reports current alcohol use of about 3.0 standard drinks of alcohol per week. She reports that she does not use drugs.  Current Outpatient Medications on File Prior to Visit  Medication Sig Dispense Refill  . cetirizine (ZYRTEC) 10 MG tablet Take 1 tablet (10 mg total) by mouth daily. 30 tablet 11  . EUTHYROX 88 MCG tablet TAKE 1 TABLET BY MOUTH ONCE DAILY BEFORE BREAKFAST 30 tablet 5  . naproxen sodium (ALEVE) 220 MG tablet Take 220 mg by mouth as needed.    . potassium chloride SA (K-DUR) 20 MEQ tablet Take 1 tablet (20 mEq total) by mouth daily. 30 tablet 2  . traMADol (ULTRAM) 50 MG tablet TAKE 1 TO 2 TABLETS BY MOUTH ONCE DAILY 60 tablet 0  . valACYclovir (VALTREX) 1000 MG tablet Take 500 mg by mouth daily.     Marland Kitchen zolpidem (AMBIEN) 5 MG tablet TAKE 1 TABLET BY MOUTH ONCE DAILY AT BEDTIME AS NEEDED FOR SLEEP 30 tablet 0   No current facility-administered medications on file prior to visit.     Objective:  Objective  Physical Exam Vitals and nursing note reviewed.  Constitutional:      Appearance: She is well-developed.  HENT:  Head: Normocephalic and atraumatic.  Eyes:     Conjunctiva/sclera: Conjunctivae normal.  Neck:     Thyroid: No thyromegaly.     Vascular: No carotid bruit or JVD.  Cardiovascular:     Rate and Rhythm: Normal rate and regular rhythm.     Heart sounds: Normal heart sounds. No murmur.  Pulmonary:     Effort: Pulmonary effort is normal. No respiratory distress.     Breath sounds: Normal breath sounds. No wheezing or rales.  Chest:     Chest wall: No tenderness.  Musculoskeletal:     Cervical back: Normal range of motion and neck supple.  Neurological:      Mental Status: She is alert and oriented to person, place, and time.    BP (!) 142/71 (BP Location: Left Arm, Patient Position: Sitting, Cuff Size: Normal)   Pulse 80   Temp (!) 96.4 F (35.8 C) (Temporal)   Resp 17   Ht 5\' 4"  (1.626 m)   Wt 266 lb (120.7 kg)   SpO2 100%   BMI 45.66 kg/m  Wt Readings from Last 3 Encounters:  06/15/19 266 lb (120.7 kg)  12/16/18 262 lb 12.8 oz (119.2 kg)  07/18/18 252 lb (114.3 kg)     Lab Results  Component Value Date   WBC 6.4 12/18/2017   HGB 13.5 12/18/2017   HCT 40.3 12/18/2017   PLT 324.0 12/18/2017   GLUCOSE 72 12/15/2018   CHOL 177 12/15/2018   TRIG 70.0 12/15/2018   HDL 50.70 12/15/2018   LDLCALC 112 (H) 12/15/2018   ALT 7 12/15/2018   AST 12 12/15/2018   NA 140 12/15/2018   K 3.4 (L) 12/15/2018   CL 100 12/15/2018   CREATININE 0.93 12/15/2018   BUN 10 12/15/2018   CO2 29 12/15/2018   TSH 0.72 08/28/2018   HGBA1C 5.3 01/09/2018   MICROALBUR <0.7 01/10/2015    US BREAST LTD UNI LEFT INC AXILLA  Result Date: 04/29/2019 CLINICAL DATA:  54 year old patient was recalled from recent screening mammogram for evaluation of possible asymmetries in the right breast and a possible asymmetry in the left breast. EXAM: DIGITAL DIAGNOSTIC BILATERAL MAMMOGRAM WITH CAD AND TOMO ULTRASOUND BILATERAL BREAST COMPARISON:  April 16, 2019 and earlier priors ACR Breast Density Category b: There are scattered areas of fibroglandular density. FINDINGS: Spot compression view of the slightly outer left breast retroareolar shows a few subcentimeter circumscribed oval masses. Spot compression views of the central and inferior right breast show approximately 3 low-density circumscribed and lobulated masses. Mammographic images were processed with CAD. Targeted ultrasound is performed, showing multiple cysts bilaterally. In the left breast, several oval simple cysts are seen in the 2-4 o'clock axis of the left breast that correlate well with findings on the  recent screening mammogram. No suspicious mass identified on ultrasound the left breast. In the right breast, clusters of cysts with internal echogenic septations are noted in the 7 o'clock axis of the right breast at 2 and 3 cm from the nipple, the largest cluster measuring 1.2 x 0.5 x 0.7 cm. No vascular flow is identified within these clusters of cysts. A single cyst is noted at 7 o'clock position 2 cm from the nipple measuring 0.4 cm. No suspicious masses identified in the right breast. IMPRESSION: Bilateral simple cysts and clusters of cysts account for the finding seen on recent screening mammogram. No suspicious findings in either breast. RECOMMENDATION: Screening mammogram in one year.(Code:SM-B-01Y) I have discussed the findings and recommendations with the patient.  If applicable, a reminder letter will be sent to the patient regarding the next appointment. BI-RADS CATEGORY  2: Benign. Electronically Signed   By: Curlene Dolphin M.D.   On: 04/29/2019 16:28   US BREAST LTD UNI RIGHT INC AXILLA  Result Date: 04/29/2019 CLINICAL DATA:  54 year old patient was recalled from recent screening mammogram for evaluation of possible asymmetries in the right breast and a possible asymmetry in the left breast. EXAM: DIGITAL DIAGNOSTIC BILATERAL MAMMOGRAM WITH CAD AND TOMO ULTRASOUND BILATERAL BREAST COMPARISON:  April 16, 2019 and earlier priors ACR Breast Density Category b: There are scattered areas of fibroglandular density. FINDINGS: Spot compression view of the slightly outer left breast retroareolar shows a few subcentimeter circumscribed oval masses. Spot compression views of the central and inferior right breast show approximately 3 low-density circumscribed and lobulated masses. Mammographic images were processed with CAD. Targeted ultrasound is performed, showing multiple cysts bilaterally. In the left breast, several oval simple cysts are seen in the 2-4 o'clock axis of the left breast that correlate well  with findings on the recent screening mammogram. No suspicious mass identified on ultrasound the left breast. In the right breast, clusters of cysts with internal echogenic septations are noted in the 7 o'clock axis of the right breast at 2 and 3 cm from the nipple, the largest cluster measuring 1.2 x 0.5 x 0.7 cm. No vascular flow is identified within these clusters of cysts. A single cyst is noted at 7 o'clock position 2 cm from the nipple measuring 0.4 cm. No suspicious masses identified in the right breast. IMPRESSION: Bilateral simple cysts and clusters of cysts account for the finding seen on recent screening mammogram. No suspicious findings in either breast. RECOMMENDATION: Screening mammogram in one year.(Code:SM-B-01Y) I have discussed the findings and recommendations with the patient. If applicable, a reminder letter will be sent to the patient regarding the next appointment. BI-RADS CATEGORY  2: Benign. Electronically Signed   By: Curlene Dolphin M.D.   On: 04/29/2019 16:28   MM DIAG BREAST TOMO BILATERAL  Result Date: 04/29/2019 CLINICAL DATA:  54 year old patient was recalled from recent screening mammogram for evaluation of possible asymmetries in the right breast and a possible asymmetry in the left breast. EXAM: DIGITAL DIAGNOSTIC BILATERAL MAMMOGRAM WITH CAD AND TOMO ULTRASOUND BILATERAL BREAST COMPARISON:  April 16, 2019 and earlier priors ACR Breast Density Category b: There are scattered areas of fibroglandular density. FINDINGS: Spot compression view of the slightly outer left breast retroareolar shows a few subcentimeter circumscribed oval masses. Spot compression views of the central and inferior right breast show approximately 3 low-density circumscribed and lobulated masses. Mammographic images were processed with CAD. Targeted ultrasound is performed, showing multiple cysts bilaterally. In the left breast, several oval simple cysts are seen in the 2-4 o'clock axis of the left breast  that correlate well with findings on the recent screening mammogram. No suspicious mass identified on ultrasound the left breast. In the right breast, clusters of cysts with internal echogenic septations are noted in the 7 o'clock axis of the right breast at 2 and 3 cm from the nipple, the largest cluster measuring 1.2 x 0.5 x 0.7 cm. No vascular flow is identified within these clusters of cysts. A single cyst is noted at 7 o'clock position 2 cm from the nipple measuring 0.4 cm. No suspicious masses identified in the right breast. IMPRESSION: Bilateral simple cysts and clusters of cysts account for the finding seen on recent screening mammogram. No suspicious findings in  either breast. RECOMMENDATION: Screening mammogram in one year.(Code:[id number]) I have discussed the findings and recommendations with the patient. If applicable, a reminder letter will be sent to the patient regarding the next appointment. BI-RADS CATEGORY  2: Benign. Electronically Signed   By: Curlene Dolphin M.D.   On: 04/29/2019 16:28     Assessment & Plan:  Plan  I have changed Kelly [name]'s ALPRAZolam. I am also having her maintain her valACYclovir, cetirizine, naproxen sodium, potassium chloride SA, traMADol, Euthyrox, and zolpidem.  Meds ordered this encounter  Medications  . ALPRAZolam (XANAX) 0.25 MG tablet    Sig: Take 1 tablet (0.25 mg total) by mouth 3 (three) times daily as needed. for anxiety    Dispense:  90 tablet    Refill:  0    Problem List Items Addressed This Visit      Unprioritized   Hypothyroidism - Primary   Relevant Orders   Thyroid Panel With TSH   Lapband APS Feb 2011    Lab band may be cause of chest pain --- f/u bariatric surgery      Palpitations    Occasionally  Pt does see cardiology and they feel it is stress / anxiety  Check thyroid      Relevant Medications   ALPRAZolam (XANAX) 0.25 MG tablet   Other Relevant Orders   CBC with Differential/Platelet   Comprehensive metabolic  panel   Lipid panel   Thyroid Panel With TSH   Vitamin B12   Vitamin D (25 hydroxy)    Other Visit Diagnoses    Tachycardia       Relevant Medications   ALPRAZolam (XANAX) 0.25 MG tablet   Other Relevant Orders   CBC with Differential/Platelet   Comprehensive metabolic panel   Lipid panel   Vitamin B12   Vitamin D (25 hydroxy)   Other fatigue       Relevant Orders   CBC with Differential/Platelet   Comprehensive metabolic panel   Lipid panel   Vitamin B12   Vitamin D (25 hydroxy)   Insulin resistance       Relevant Orders   Insulin, random   Morbid obesity ([place])       Relevant Orders   Ambulatory referral to General Surgery      Follow-up: Return in about 6 months (around 12/16/2019), or if symptoms worsen or fail to improve, for annual exam, fasting.  Ann Held, DO

## 2019-06-16 ENCOUNTER — Other Ambulatory Visit (INDEPENDENT_AMBULATORY_CARE_PROVIDER_SITE_OTHER): Payer: 59

## 2019-06-16 ENCOUNTER — Other Ambulatory Visit: Payer: Self-pay

## 2019-06-16 DIAGNOSIS — R002 Palpitations: Secondary | ICD-10-CM

## 2019-06-16 DIAGNOSIS — E8881 Metabolic syndrome: Secondary | ICD-10-CM

## 2019-06-16 DIAGNOSIS — R5383 Other fatigue: Secondary | ICD-10-CM

## 2019-06-16 DIAGNOSIS — E039 Hypothyroidism, unspecified: Secondary | ICD-10-CM

## 2019-06-16 DIAGNOSIS — R000 Tachycardia, unspecified: Secondary | ICD-10-CM | POA: Diagnosis not present

## 2019-06-16 LAB — CBC WITH DIFFERENTIAL/PLATELET
Basophils Absolute: 0.1 10*3/uL (ref 0.0–0.1)
Basophils Relative: 1.3 % (ref 0.0–3.0)
Eosinophils Absolute: 0.2 10*3/uL (ref 0.0–0.7)
Eosinophils Relative: 2.6 % (ref 0.0–5.0)
HCT: 39.5 % (ref 36.0–46.0)
Hemoglobin: 13.3 g/dL (ref 12.0–15.0)
Lymphocytes Relative: 40.1 % (ref 12.0–46.0)
Lymphs Abs: 3.2 10*3/uL (ref 0.7–4.0)
MCHC: 33.8 g/dL (ref 30.0–36.0)
MCV: 92.6 fl (ref 78.0–100.0)
Monocytes Absolute: 0.4 10*3/uL (ref 0.1–1.0)
Monocytes Relative: 4.9 % (ref 3.0–12.0)
Neutro Abs: 4.1 10*3/uL (ref 1.4–7.7)
Neutrophils Relative %: 51.1 % (ref 43.0–77.0)
Platelets: 318 10*3/uL (ref 150.0–400.0)
RBC: 4.27 Mil/uL (ref 3.87–5.11)
RDW: 13.7 % (ref 11.5–15.5)
WBC: 8 10*3/uL (ref 4.0–10.5)

## 2019-06-16 LAB — LIPID PANEL
Cholesterol: 199 mg/dL (ref 0–200)
HDL: 51.7 mg/dL (ref >39.00)
LDL Cholesterol: 116 mg/dL — ABNORMAL HIGH (ref 0–99)
NonHDL: 147.12
Total CHOL/HDL Ratio: 4
Triglycerides: 155 mg/dL — ABNORMAL HIGH (ref 0.0–149.0)
VLDL: 31 mg/dL (ref 0.0–40.0)

## 2019-06-16 LAB — COMPREHENSIVE METABOLIC PANEL
ALT: 9 U/L (ref 0–35)
AST: 15 U/L (ref 0–37)
Albumin: 3.8 g/dL (ref 3.5–5.2)
Alkaline Phosphatase: 86 U/L (ref 39–117)
BUN: 9 mg/dL (ref 6–23)
CO2: 27 mEq/L (ref 19–32)
Calcium: 9.5 mg/dL (ref 8.4–10.5)
Chloride: 99 mEq/L (ref 96–112)
Creatinine, Ser: 0.93 mg/dL (ref 0.40–1.20)
GFR: 75.83 mL/min (ref >60.00)
Glucose, Bld: 78 mg/dL (ref 70–99)
Potassium: 3.5 mEq/L (ref 3.5–5.1)
Sodium: 135 mEq/L (ref 135–145)
Total Bilirubin: 0.7 mg/dL (ref 0.2–1.2)
Total Protein: 7.7 g/dL (ref 6.0–8.3)

## 2019-06-16 LAB — VITAMIN D 25 HYDROXY (VIT D DEFICIENCY, FRACTURES): VITD: 23.8 ng/mL — ABNORMAL LOW (ref 30.00–100.00)

## 2019-06-16 LAB — VITAMIN B12: Vitamin B-12: 139 pg/mL — ABNORMAL LOW (ref 211–911)

## 2019-06-16 NOTE — Assessment & Plan Note (Signed)
Lab band may be cause of chest pain --- f/u bariatric surgery

## 2019-06-16 NOTE — Assessment & Plan Note (Signed)
Occasionally  Pt does see cardiology and they feel it is stress / anxiety  Check thyroid

## 2019-06-17 ENCOUNTER — Other Ambulatory Visit: Payer: Self-pay | Admitting: Family Medicine

## 2019-06-17 ENCOUNTER — Other Ambulatory Visit: Payer: Self-pay

## 2019-06-17 DIAGNOSIS — E785 Hyperlipidemia, unspecified: Secondary | ICD-10-CM

## 2019-06-17 DIAGNOSIS — E538 Deficiency of other specified B group vitamins: Secondary | ICD-10-CM

## 2019-06-17 DIAGNOSIS — E559 Vitamin D deficiency, unspecified: Secondary | ICD-10-CM

## 2019-06-17 LAB — THYROID PANEL WITH TSH
Free Thyroxine Index: 2.8 (ref 1.4–3.8)
T3 Uptake: 29 % (ref 22–35)
T4, Total: 9.5 ug/dL (ref 5.1–11.9)
TSH: 2.78 mIU/L

## 2019-06-17 LAB — INSULIN, RANDOM: Insulin: 10.8 u[IU]/mL

## 2019-06-17 MED ORDER — VITAMIN D (ERGOCALCIFEROL) 1.25 MG (50000 UNIT) PO CAPS: 50000.0000 [IU] | ORAL_CAPSULE | ORAL | 0 refills | Status: DC

## 2019-06-23 ENCOUNTER — Ambulatory Visit: Payer: 59 | Admitting: Family Medicine

## 2019-06-24 ENCOUNTER — Ambulatory Visit (INDEPENDENT_AMBULATORY_CARE_PROVIDER_SITE_OTHER): Payer: 59

## 2019-06-24 ENCOUNTER — Other Ambulatory Visit: Payer: Self-pay

## 2019-06-24 DIAGNOSIS — E538 Deficiency of other specified B group vitamins: Secondary | ICD-10-CM

## 2019-06-24 MED ORDER — CYANOCOBALAMIN 1000 MCG/ML IJ SOLN
1000.0000 ug | Freq: Once | INTRAMUSCULAR | Status: AC
  Administered 2019-06-24: 1000 ug via INTRAMUSCULAR

## 2019-06-24 NOTE — Progress Notes (Addendum)
Pt here for monthly B12 injection per Dr. Etter Sjogren  B12 1050mcg given IM, and pt tolerated injection well.  Next B12 injection scheduled for next week.  Kathlene November, MD

## 2019-07-01 ENCOUNTER — Other Ambulatory Visit: Payer: Self-pay

## 2019-07-01 ENCOUNTER — Ambulatory Visit (INDEPENDENT_AMBULATORY_CARE_PROVIDER_SITE_OTHER): Payer: 59

## 2019-07-01 DIAGNOSIS — E538 Deficiency of other specified B group vitamins: Secondary | ICD-10-CM

## 2019-07-01 MED ORDER — CYANOCOBALAMIN 1000 MCG/ML IJ SOLN
1000.0000 ug | Freq: Once | INTRAMUSCULAR | Status: AC
  Administered 2019-07-01: 1000 ug via INTRAMUSCULAR

## 2019-07-01 NOTE — Progress Notes (Addendum)
Pt here for weekly x 4 weeks then monthtly B12 injection per Dr. Etter Sjogren. Today she is getting # 2 ok 4 weekly.   B12 1014mcg given IM, and pt tolerated injection well.  Next B12 injection scheduled for 07-08-19.  Noted   Ann Held, DO

## 2019-07-08 ENCOUNTER — Ambulatory Visit (INDEPENDENT_AMBULATORY_CARE_PROVIDER_SITE_OTHER): Payer: 59 | Admitting: *Deleted

## 2019-07-08 ENCOUNTER — Other Ambulatory Visit: Payer: Self-pay

## 2019-07-08 DIAGNOSIS — E538 Deficiency of other specified B group vitamins: Secondary | ICD-10-CM | POA: Diagnosis not present

## 2019-07-08 MED ORDER — CYANOCOBALAMIN 1000 MCG/ML IJ SOLN
1000.0000 ug | Freq: Once | INTRAMUSCULAR | Status: AC
  Administered 2019-07-08: 1000 ug via INTRAMUSCULAR

## 2019-07-08 NOTE — Progress Notes (Addendum)
Pt here for weekly x 4 weeks then monthtly B12 injection per Dr. Etter Sjogren. Today she is getting # 3 of 4  B12 1067mcg given right deltoid, and pt tolerated injection well.  Next B12 injection scheduled.  Kathlene November, MD

## 2019-07-16 ENCOUNTER — Ambulatory Visit: Payer: 59

## 2019-07-16 ENCOUNTER — Other Ambulatory Visit: Payer: Self-pay

## 2019-07-16 DIAGNOSIS — L509 Urticaria, unspecified: Secondary | ICD-10-CM

## 2019-07-16 DIAGNOSIS — E538 Deficiency of other specified B group vitamins: Secondary | ICD-10-CM

## 2019-07-16 MED ORDER — CETIRIZINE HCL 10 MG PO TABS: 10.0000 mg | ORAL_TABLET | Freq: Every day | ORAL | 11 refills | Status: AC

## 2019-07-16 MED ORDER — CYANOCOBALAMIN 1000 MCG/ML IJ SOLN: 1000.0000 ug | Freq: Once | INTRAMUSCULAR | Status: DC

## 2019-07-16 NOTE — Progress Notes (Signed)
Kelly [name] is a 54 y.o. female presents to the office today for cyanocobalamin 1000 injection, per physician's orders.  Original order would benefit from b12 infections weekly x4 then monthly, 06-17-19 on lab results. This is 4 of 4 weekly.  B12  was administered IM, (location) today. Patient tolerated injection.  Patient next injection due in one week Jiles Prows

## 2019-07-21 ENCOUNTER — Other Ambulatory Visit: Payer: Self-pay | Admitting: Family Medicine

## 2019-07-21 DIAGNOSIS — G4700 Insomnia, unspecified: Secondary | ICD-10-CM

## 2019-07-21 NOTE — Telephone Encounter (Signed)
Requesting: ambien  Contract: no UDS:n/a Last Visit:06/15/19 Next Visit:12/24/19 Last Refill: 05/05/19  Please Advise

## 2019-08-12 ENCOUNTER — Ambulatory Visit (INDEPENDENT_AMBULATORY_CARE_PROVIDER_SITE_OTHER): Payer: 59 | Admitting: *Deleted

## 2019-08-12 ENCOUNTER — Other Ambulatory Visit: Payer: Self-pay

## 2019-08-12 DIAGNOSIS — E538 Deficiency of other specified B group vitamins: Secondary | ICD-10-CM | POA: Diagnosis not present

## 2019-08-12 MED ORDER — CYANOCOBALAMIN 1000 MCG/ML IJ SOLN
1000.0000 ug | Freq: Once | INTRAMUSCULAR | Status: AC
  Administered 2019-08-12: 1000 ug via INTRAMUSCULAR

## 2019-08-12 NOTE — Progress Notes (Addendum)
Patient in for monthly b12 injection per Dr. Carollee Herter.  Injection given in left deltoid and patient tolerated well.  Patient scheduled for next month.  Reviewed Ann Held, DO

## 2019-08-14 ENCOUNTER — Encounter: Payer: Self-pay | Admitting: Family Medicine

## 2019-08-14 ENCOUNTER — Ambulatory Visit: Payer: 59 | Admitting: Family Medicine

## 2019-08-14 ENCOUNTER — Other Ambulatory Visit: Payer: Self-pay

## 2019-08-14 ENCOUNTER — Ambulatory Visit (HOSPITAL_BASED_OUTPATIENT_CLINIC_OR_DEPARTMENT_OTHER)
Admission: RE | Admit: 2019-08-14 | Discharge: 2019-08-14 | Disposition: A | Discharge status: Home / Self Care | Payer: 59 | Source: Ambulatory Visit | Attending: Family Medicine | Admitting: Family Medicine

## 2019-08-14 VITALS — BP 120/78 | HR 105 | Temp 97.5°F | Resp 18 | Ht 64.0 in | Wt 268.6 lb

## 2019-08-14 DIAGNOSIS — R0602 Shortness of breath: Secondary | ICD-10-CM | POA: Diagnosis not present

## 2019-08-14 DIAGNOSIS — Z8616 Personal history of COVID-19: Secondary | ICD-10-CM | POA: Diagnosis not present

## 2019-08-14 DIAGNOSIS — J9801 Acute bronchospasm: Secondary | ICD-10-CM

## 2019-08-14 MED ORDER — ALBUTEROL SULFATE HFA 108 (90 BASE) MCG/ACT IN AERS: 2.0000 | INHALATION_SPRAY | Freq: Four times a day (QID) | RESPIRATORY_TRACT | 2 refills | Status: DC | PRN

## 2019-08-14 NOTE — Patient Instructions (Signed)
COVID-19 COVID-19 is a respiratory infection that is caused by a virus called severe acute respiratory syndrome coronavirus 2 (SARS-CoV-2). The disease is also known as coronavirus disease or novel coronavirus. In some people, the virus may not cause any symptoms. In others, it may cause a serious infection. The infection can get worse quickly and can lead to complications, such as:  Pneumonia, or infection of the lungs.  Acute respiratory distress syndrome or ARDS. This is a condition in which fluid build-up in the lungs prevents the lungs from filling with air and passing oxygen into the blood.  Acute respiratory failure. This is a condition in which there is not enough oxygen passing from the lungs to the body or when carbon dioxide is not passing from the lungs out of the body.  Sepsis or septic shock. This is a serious bodily reaction to an infection.  Blood clotting problems.  Secondary infections due to bacteria or fungus.  Organ failure. This is when your body's organs stop working. The virus that causes COVID-19 is contagious. This means that it can spread from person to person through droplets from coughs and sneezes (respiratory secretions). What are the causes? This illness is caused by a virus. You may catch the virus by:  Breathing in droplets from an infected person. Droplets can be spread by a person breathing, speaking, singing, coughing, or sneezing.  Touching something, like a table or a doorknob, that was exposed to the virus (contaminated) and then touching your mouth, nose, or eyes. What increases the risk? Risk for infection You are more likely to be infected with this virus if you:  Are within 6 feet (2 meters) of a person with COVID-19.  Provide care for or live with a person who is infected with COVID-19.  Spend time in crowded indoor spaces or live in shared housing. Risk for serious illness You are more likely to become seriously ill from the virus if you:   Are 50 years of age or older. The higher your age, the more you are at risk for serious illness.  Live in a nursing home or long-term care facility.  Have cancer.  Have a long-term (chronic) disease such as: ? Chronic lung disease, including chronic obstructive pulmonary disease or asthma. ? A long-term disease that lowers your body's ability to fight infection (immunocompromised). ? Heart disease, including heart failure, a condition in which the arteries that lead to the heart become narrow or blocked (coronary artery disease), a disease which makes the heart muscle thick, weak, or stiff (cardiomyopathy). ? Diabetes. ? Chronic kidney disease. ? Sickle cell disease, a condition in which red blood cells have an abnormal "sickle" shape. ? Liver disease.  Are obese. What are the signs or symptoms? Symptoms of this condition can range from mild to severe. Symptoms may appear any time from 2 to 14 days after being exposed to the virus. They include:  A fever or chills.  A cough.  Difficulty breathing.  Headaches, body aches, or muscle aches.  Runny or stuffy (congested) nose.  A sore throat.  New loss of taste or smell. Some people may also have stomach problems, such as nausea, vomiting, or diarrhea. Other people may not have any symptoms of COVID-19. How is this diagnosed? This condition may be diagnosed based on:  Your signs and symptoms, especially if: ? You live in an area with a COVID-19 outbreak. ? You recently traveled to or from an area where the virus is common. ? You   provide care for or live with a person who was diagnosed with COVID-19. ? You were exposed to a person who was diagnosed with COVID-19.  A physical exam.  Lab tests, which may include: ? Taking a sample of fluid from the back of your nose and throat (nasopharyngeal fluid), your nose, or your throat using a swab. ? A sample of mucus from your lungs (sputum). ? Blood tests.  Imaging tests, which  may include, X-rays, CT scan, or ultrasound. How is this treated? At present, there is no medicine to treat COVID-19. Medicines that treat other diseases are being used on a trial basis to see if they are effective against COVID-19. Your health care provider will talk with you about ways to treat your symptoms. For most people, the infection is mild and can be managed at home with rest, fluids, and over-the-counter medicines. Treatment for a serious infection usually takes places in a hospital intensive care unit (ICU). It may include one or more of the following treatments. These treatments are given until your symptoms improve.  Receiving fluids and medicines through an IV.  Supplemental oxygen. Extra oxygen is given through a tube in the nose, a face mask, or a hood.  Positioning you to lie on your stomach (prone position). This makes it easier for oxygen to get into the lungs.  Continuous positive airway pressure (CPAP) or bi-level positive airway pressure (BPAP) machine. This treatment uses mild air pressure to keep the airways open. A tube that is connected to a motor delivers oxygen to the body.  Ventilator. This treatment moves air into and out of the lungs by using a tube that is placed in your windpipe.  Tracheostomy. This is a procedure to create a hole in the neck so that a breathing tube can be inserted.  Extracorporeal membrane oxygenation (ECMO). This procedure gives the lungs a chance to recover by taking over the functions of the heart and lungs. It supplies oxygen to the body and removes carbon dioxide. Follow these instructions at home: Lifestyle  If you are sick, stay home except to get medical care. Your health care provider will tell you how long to stay home. Call your health care provider before you go for medical care.  Rest at home as told by your health care provider.  Do not use any products that contain nicotine or tobacco, such as cigarettes, e-cigarettes, and  chewing tobacco. If you need help quitting, ask your health care provider.  Return to your normal activities as told by your health care provider. Ask your health care provider what activities are safe for you. General instructions  Take over-the-counter and prescription medicines only as told by your health care provider.  Drink enough fluid to keep your urine pale yellow.  Keep all follow-up visits as told by your health care provider. This is important. How is this prevented?  There is no vaccine to help prevent COVID-19 infection. However, there are steps you can take to protect yourself and others from this virus. To protect yourself:   Do not travel to areas where COVID-19 is a risk. The areas where COVID-19 is reported change often. To identify high-risk areas and travel restrictions, check the CDC travel website: wwwnc.cdc.gov/travel/notices  If you live in, or must travel to, an area where COVID-19 is a risk, take precautions to avoid infection. ? Stay away from people who are sick. ? Wash your hands often with soap and water for 20 seconds. If soap and water   are not available, use an alcohol-based hand sanitizer. ? Avoid touching your mouth, face, eyes, or nose. ? Avoid going out in public, follow guidance from your state and local health authorities. ? If you must go out in public, wear a cloth face covering or face mask. Make sure your mask covers your nose and mouth. ? Avoid crowded indoor spaces. Stay at least 6 feet (2 meters) away from others. ? Disinfect objects and surfaces that are frequently touched every day. This may include:  Counters and tables.  Doorknobs and light switches.  Sinks and faucets.  Electronics, such as phones, remote controls, keyboards, computers, and tablets. To protect others: If you have symptoms of COVID-19, take steps to prevent the virus from spreading to others.  If you think you have a COVID-19 infection, contact your health care  provider right away. Tell your health care team that you think you may have a COVID-19 infection.  Stay home. Leave your house only to seek medical care. Do not use public transport.  Do not travel while you are sick.  Wash your hands often with soap and water for 20 seconds. If soap and water are not available, use alcohol-based hand sanitizer.  Stay away from other members of your household. Let healthy household members care for children and pets, if possible. If you have to care for children or pets, wash your hands often and wear a mask. If possible, stay in your own room, separate from others. Use a different bathroom.  Make sure that all people in your household wash their hands well and often.  Cough or sneeze into a tissue or your sleeve or elbow. Do not cough or sneeze into your hand or into the air.  Wear a cloth face covering or face mask. Make sure your mask covers your nose and mouth. Where to find more information  Centers for Disease Control and Prevention: www.cdc.gov/coronavirus/2019-ncov/index.html  World Health Organization: www.who.int/health-topics/coronavirus Contact a health care provider if:  You live in or have traveled to an area where COVID-19 is a risk and you have symptoms of the infection.  You have had contact with someone who has COVID-19 and you have symptoms of the infection. Get help right away if:  You have trouble breathing.  You have pain or pressure in your chest.  You have confusion.  You have bluish lips and fingernails.  You have difficulty waking from sleep.  You have symptoms that get worse. These symptoms may represent a serious problem that is an emergency. Do not wait to see if the symptoms will go away. Get medical help right away. Call your local emergency services (911 in the [place] Do not drive yourself to the hospital. Let the emergency medical personnel know if you think you have COVID-19. Summary  COVID-19 is a  respiratory infection that is caused by a virus. It is also known as coronavirus disease or novel coronavirus. It can cause serious infections, such as pneumonia, acute respiratory distress syndrome, acute respiratory failure, or sepsis.  The virus that causes COVID-19 is contagious. This means that it can spread from person to person through droplets from breathing, speaking, singing, coughing, or sneezing.  You are more likely to develop a serious illness if you are 50 years of age or older, have a weak immune system, live in a nursing home, or have chronic disease.  There is no medicine to treat COVID-19. Your health care provider will talk with you about ways to treat your symptoms.    Take steps to protect yourself and others from infection. Wash your hands often and disinfect objects and surfaces that are frequently touched every day. Stay away from people who are sick and wear a mask if you are sick. This information is not intended to replace advice given to you by your health care provider. Make sure you discuss any questions you have with your health care provider. Document Revised: 01/09/2019 Document Reviewed: 04/17/2018 Elsevier Patient Education  2020 [employer]  

## 2019-08-14 NOTE — Assessment & Plan Note (Signed)
Pt with hx covid 19 -----  Check cxr proair inh Refer pulmonary due to sob since January

## 2019-08-14 NOTE — Progress Notes (Signed)
Patient ID: Kelly [name], female    DOB: [birth year]  Age: 54 y.o. MRN: [id number]    Subjective:  Subjective  HPI Kelly [name] presents for c/o wheezing and tightness in chest since having covid.  It is worse at night and she gets sob easily   Review of Systems  Constitutional: Negative for appetite change, diaphoresis, fatigue and unexpected weight change.  Eyes: Negative for pain, redness and visual disturbance.  Respiratory: Positive for shortness of breath and wheezing. Negative for cough and chest tightness.   Cardiovascular: Negative for chest pain, palpitations and leg swelling.  Endocrine: Negative for cold intolerance, heat intolerance, polydipsia, polyphagia and polyuria.  Genitourinary: Negative for difficulty urinating, dysuria and frequency.  Neurological: Negative for dizziness, light-headedness, numbness and headaches.    History Past Medical History:  Diagnosis Date  . Anxiety   . B12 deficiency   . Breast cyst   . Depression with anxiety   . Edema   . Heart murmur   . Herpes   . History of hiatal hernia    repaur 2011   . Hypothyroidism   . Joint pain   . Menopause   . Morbid obesity ([place]) 2009  . Ovarian cyst   . PAC (premature atrial contraction)   . Pedunculated colonic polyp 03/04/15   tubulovillous adenoma of transverse colon.   . Pneumonia    hx of as a child   . PONV (postoperative nausea and vomiting)   . PVC (premature ventricular contraction)   . Thyroid goiter   . Uterine fibroid   . Vitamin D deficiency     She has a past surgical history that includes Laparoscopic gastric banding (05-23-09); Liposuction (1995); and Laparoscopic right hemi colectomy (Right, 03/25/2015).   Her family history includes Alzheimer's disease in her paternal grandfather; Aneurysm (age of onset: 41) in her mother; Diabetes in her paternal aunt; Heart disease in her maternal grandfather; Heart disease (age of onset: 46) in her paternal grandmother;  Hyperlipidemia in her father and mother; Hypertension in her father, maternal grandmother, mother, and paternal grandmother; Obesity in her father; Stroke in her mother; Thyroid disease in her mother.She reports that she has never smoked. She has never used smokeless tobacco. She reports current alcohol use of about 3.0 standard drinks of alcohol per week. She reports that she does not use drugs.  Current Outpatient Medications on File Prior to Visit  Medication Sig Dispense Refill  . ALPRAZolam (XANAX) 0.25 MG tablet Take 1 tablet (0.25 mg total) by mouth 3 (three) times daily as needed. for anxiety 90 tablet 0  . cetirizine (ZYRTEC) 10 MG tablet Take 1 tablet (10 mg total) by mouth daily. 30 tablet 11  . EUTHYROX 88 MCG tablet TAKE 1 TABLET BY MOUTH ONCE DAILY BEFORE BREAKFAST 30 tablet 5  . naproxen sodium (ALEVE) 220 MG tablet Take 220 mg by mouth as needed.    Marland Kitchen spironolactone-hydrochlorothiazide (ALDACTAZIDE) 25-25 MG tablet TAKE 1 TABLET BY MOUTH ONCE DAILY IN THE MORNING 90 tablet 0  . traMADol (ULTRAM) 50 MG tablet TAKE 1 TO 2 TABLETS BY MOUTH ONCE DAILY 60 tablet 0  . valACYclovir (VALTREX) 1000 MG tablet Take 500 mg by mouth daily.     . Vitamin D, Ergocalciferol, (DRISDOL) 1.25 MG (50000 UNIT) CAPS capsule Take 1 capsule (50,000 Units total) by mouth every 7 (seven) days. 12 capsule 0  . zolpidem (AMBIEN) 5 MG tablet TAKE 1 TABLET BY MOUTH ONCE DAILY AT BEDTIME AS NEEDED FOR SLEEP  30 tablet 0  . potassium chloride SA (K-DUR) 20 MEQ tablet Take 1 tablet (20 mEq total) by mouth daily. (Patient not taking: Reported on 08/14/2019) 30 tablet 2   No current facility-administered medications on file prior to visit.     Objective:  Objective  Physical Exam Vitals and nursing note reviewed.  Constitutional:      Appearance: She is well-developed.  HENT:     Head: Normocephalic and atraumatic.  Eyes:     Conjunctiva/sclera: Conjunctivae normal.  Neck:     Thyroid: No thyromegaly.      Vascular: No carotid bruit or JVD.  Cardiovascular:     Rate and Rhythm: Normal rate and regular rhythm.     Heart sounds: Normal heart sounds. No murmur.  Pulmonary:     Effort: Pulmonary effort is normal. No respiratory distress.     Breath sounds: Normal breath sounds. No wheezing or rales.  Chest:     Chest wall: No tenderness.  Musculoskeletal:     Cervical back: Normal range of motion and neck supple.  Neurological:     Mental Status: She is alert and oriented to person, place, and time.    BP 120/78 (BP Location: Right Arm, Patient Position: Sitting, Cuff Size: Large)   Pulse (!) 105   Temp (!) 97.5 F (36.4 C) (Temporal)   Resp 18   Ht 5\' 4"  (1.626 m)   Wt 268 lb 9.6 oz (121.8 kg)   SpO2 97%   BMI 46.11 kg/m  Wt Readings from Last 3 Encounters:  08/14/19 268 lb 9.6 oz (121.8 kg)  06/15/19 266 lb (120.7 kg)  12/16/18 262 lb 12.8 oz (119.2 kg)     Lab Results  Component Value Date   WBC 8.0 06/16/2019   HGB 13.3 06/16/2019   HCT 39.5 06/16/2019   PLT 318.0 06/16/2019   GLUCOSE 78 06/16/2019   CHOL 199 06/16/2019   TRIG 155.0 (H) 06/16/2019   HDL 51.70 06/16/2019   LDLCALC 116 (H) 06/16/2019   ALT 9 06/16/2019   AST 15 06/16/2019   NA 135 06/16/2019   K 3.5 06/16/2019   CL 99 06/16/2019   CREATININE 0.93 06/16/2019   BUN 9 06/16/2019   CO2 27 06/16/2019   TSH 2.78 06/16/2019   HGBA1C 5.3 01/09/2018   MICROALBUR <0.7 01/10/2015    US BREAST LTD UNI LEFT INC AXILLA  Result Date: 04/29/2019 CLINICAL DATA:  54 year old patient was recalled from recent screening mammogram for evaluation of possible asymmetries in the right breast and a possible asymmetry in the left breast. EXAM: DIGITAL DIAGNOSTIC BILATERAL MAMMOGRAM WITH CAD AND TOMO ULTRASOUND BILATERAL BREAST COMPARISON:  April 16, 2019 and earlier priors ACR Breast Density Category b: There are scattered areas of fibroglandular density. FINDINGS: Spot compression view of the slightly outer left breast  retroareolar shows a few subcentimeter circumscribed oval masses. Spot compression views of the central and inferior right breast show approximately 3 low-density circumscribed and lobulated masses. Mammographic images were processed with CAD. Targeted ultrasound is performed, showing multiple cysts bilaterally. In the left breast, several oval simple cysts are seen in the 2-4 o'clock axis of the left breast that correlate well with findings on the recent screening mammogram. No suspicious mass identified on ultrasound the left breast. In the right breast, clusters of cysts with internal echogenic septations are noted in the 7 o'clock axis of the right breast at 2 and 3 cm from the nipple, the largest cluster measuring 1.2 x 0.5  x 0.7 cm. No vascular flow is identified within these clusters of cysts. A single cyst is noted at 7 o'clock position 2 cm from the nipple measuring 0.4 cm. No suspicious masses identified in the right breast. IMPRESSION: Bilateral simple cysts and clusters of cysts account for the finding seen on recent screening mammogram. No suspicious findings in either breast. RECOMMENDATION: Screening mammogram in one year.(Code:SM-B-01Y) I have discussed the findings and recommendations with the patient. If applicable, a reminder letter will be sent to the patient regarding the next appointment. BI-RADS CATEGORY  2: Benign. Electronically Signed   By: Curlene Dolphin M.D.   On: 04/29/2019 16:28   US BREAST LTD UNI RIGHT INC AXILLA  Result Date: 04/29/2019 CLINICAL DATA:  54 year old patient was recalled from recent screening mammogram for evaluation of possible asymmetries in the right breast and a possible asymmetry in the left breast. EXAM: DIGITAL DIAGNOSTIC BILATERAL MAMMOGRAM WITH CAD AND TOMO ULTRASOUND BILATERAL BREAST COMPARISON:  April 16, 2019 and earlier priors ACR Breast Density Category b: There are scattered areas of fibroglandular density. FINDINGS: Spot compression view of the  slightly outer left breast retroareolar shows a few subcentimeter circumscribed oval masses. Spot compression views of the central and inferior right breast show approximately 3 low-density circumscribed and lobulated masses. Mammographic images were processed with CAD. Targeted ultrasound is performed, showing multiple cysts bilaterally. In the left breast, several oval simple cysts are seen in the 2-4 o'clock axis of the left breast that correlate well with findings on the recent screening mammogram. No suspicious mass identified on ultrasound the left breast. In the right breast, clusters of cysts with internal echogenic septations are noted in the 7 o'clock axis of the right breast at 2 and 3 cm from the nipple, the largest cluster measuring 1.2 x 0.5 x 0.7 cm. No vascular flow is identified within these clusters of cysts. A single cyst is noted at 7 o'clock position 2 cm from the nipple measuring 0.4 cm. No suspicious masses identified in the right breast. IMPRESSION: Bilateral simple cysts and clusters of cysts account for the finding seen on recent screening mammogram. No suspicious findings in either breast. RECOMMENDATION: Screening mammogram in one year.(Code:SM-B-01Y) I have discussed the findings and recommendations with the patient. If applicable, a reminder letter will be sent to the patient regarding the next appointment. BI-RADS CATEGORY  2: Benign. Electronically Signed   By: Curlene Dolphin M.D.   On: 04/29/2019 16:28   MM DIAG BREAST TOMO BILATERAL  Result Date: 04/29/2019 CLINICAL DATA:  53 year old patient was recalled from recent screening mammogram for evaluation of possible asymmetries in the right breast and a possible asymmetry in the left breast. EXAM: DIGITAL DIAGNOSTIC BILATERAL MAMMOGRAM WITH CAD AND TOMO ULTRASOUND BILATERAL BREAST COMPARISON:  April 16, 2019 and earlier priors ACR Breast Density Category b: There are scattered areas of fibroglandular density. FINDINGS: Spot  compression view of the slightly outer left breast retroareolar shows a few subcentimeter circumscribed oval masses. Spot compression views of the central and inferior right breast show approximately 3 low-density circumscribed and lobulated masses. Mammographic images were processed with CAD. Targeted ultrasound is performed, showing multiple cysts bilaterally. In the left breast, several oval simple cysts are seen in the 2-4 o'clock axis of the left breast that correlate well with findings on the recent screening mammogram. No suspicious mass identified on ultrasound the left breast. In the right breast, clusters of cysts with internal echogenic septations are noted in the 7 o'clock axis of  the right breast at 2 and 3 cm from the nipple, the largest cluster measuring 1.2 x 0.5 x 0.7 cm. No vascular flow is identified within these clusters of cysts. A single cyst is noted at 7 o'clock position 2 cm from the nipple measuring 0.4 cm. No suspicious masses identified in the right breast. IMPRESSION: Bilateral simple cysts and clusters of cysts account for the finding seen on recent screening mammogram. No suspicious findings in either breast. RECOMMENDATION: Screening mammogram in one year.(Code:[id number]-01Y) I have discussed the findings and recommendations with the patient. If applicable, a reminder letter will be sent to the patient regarding the next appointment. BI-RADS CATEGORY  2: Benign. Electronically Signed   By: Curlene Dolphin M.D.   On: 04/29/2019 16:28     Assessment & Plan:  Plan  I am having Kelly [name] start on albuterol. I am also having her maintain her valACYclovir, naproxen sodium, potassium chloride SA, traMADol, Euthyrox, spironolactone-hydrochlorothiazide, ALPRAZolam, Vitamin D (Ergocalciferol), cetirizine, and zolpidem.  Meds ordered this encounter  Medications  . albuterol (PROAIR HFA) 108 (90 Base) MCG/ACT inhaler    Sig: Inhale 2 puffs into the lungs every 6 (six) hours as needed  for wheezing or shortness of breath.    Dispense:  18 g    Refill:  2    Problem List Items Addressed This Visit      Unprioritized   SOB (shortness of breath) - Primary    Pt with hx covid 19 -----  Check cxr proair inh Refer pulmonary due to sob since January       Relevant Medications   albuterol (PROAIR HFA) 108 (90 Base) MCG/ACT inhaler   Other Relevant Orders   DG Chest 2 View   Ambulatory referral to Pulmonology    Other Visit Diagnoses    History of COVID-19       Relevant Orders   Ambulatory referral to Pulmonology   Bronchospasm       Relevant Medications   albuterol (PROAIR HFA) 108 (90 Base) MCG/ACT inhaler      Follow-up: Return if symptoms worsen or fail to improve.  Ann Held, DO

## 2019-08-26 ENCOUNTER — Other Ambulatory Visit: Payer: Self-pay | Admitting: Family Medicine

## 2019-08-26 DIAGNOSIS — G4700 Insomnia, unspecified: Secondary | ICD-10-CM

## 2019-08-27 NOTE — Telephone Encounter (Signed)
Requesting: ambien Contract:n/a UDS:n/a Last Visit: 08/14/19 Next Visit:12/24/19 Last Refill:07/21/19  Please Advise

## 2019-09-10 ENCOUNTER — Other Ambulatory Visit: Payer: Self-pay

## 2019-09-10 ENCOUNTER — Ambulatory Visit (INDEPENDENT_AMBULATORY_CARE_PROVIDER_SITE_OTHER): Payer: 59

## 2019-09-10 DIAGNOSIS — E538 Deficiency of other specified B group vitamins: Secondary | ICD-10-CM

## 2019-09-10 MED ORDER — CYANOCOBALAMIN 1000 MCG/ML IJ SOLN
1000.0000 ug | Freq: Once | INTRAMUSCULAR | Status: AC
  Administered 2019-09-10: 1000 ug via INTRAMUSCULAR

## 2019-09-10 NOTE — Progress Notes (Signed)
Reviewed  Nuri R Lowne Chase, DO  

## 2019-09-10 NOTE — Progress Notes (Signed)
Kelly [name] is a 54 y.o. female presents to the office today for monthly cyanocobalamin injections, per physician's orders.  B12 was administered  today. Patient tolerated injection. Patient due for follow up labs/provider app 12-24-19.  Patient next injection due in one month, appt made for 10-13-19  Jiles Prows

## 2019-09-11 ENCOUNTER — Ambulatory Visit: Payer: 59

## 2019-10-13 ENCOUNTER — Ambulatory Visit: Payer: 59

## 2019-10-23 ENCOUNTER — Other Ambulatory Visit: Payer: Self-pay | Admitting: Cardiology

## 2019-10-23 DIAGNOSIS — I10 Essential (primary) hypertension: Secondary | ICD-10-CM

## 2019-10-23 DIAGNOSIS — R600 Localized edema: Secondary | ICD-10-CM

## 2019-10-26 ENCOUNTER — Other Ambulatory Visit: Payer: Self-pay | Admitting: Family Medicine

## 2019-10-26 DIAGNOSIS — G4700 Insomnia, unspecified: Secondary | ICD-10-CM

## 2019-10-27 NOTE — Telephone Encounter (Signed)
Ambien refill.   Last OV: 08/14/2019 Last Fill: 08/27/2019 #30 and 0rf Pt sig: 1 tab qhs prn UDS: None

## 2019-11-26 ENCOUNTER — Other Ambulatory Visit: Payer: Self-pay | Admitting: Family Medicine

## 2019-11-26 ENCOUNTER — Other Ambulatory Visit: Payer: Self-pay | Admitting: Cardiology

## 2019-11-26 DIAGNOSIS — G4700 Insomnia, unspecified: Secondary | ICD-10-CM

## 2019-11-26 DIAGNOSIS — I10 Essential (primary) hypertension: Secondary | ICD-10-CM

## 2019-11-26 DIAGNOSIS — R600 Localized edema: Secondary | ICD-10-CM

## 2019-11-27 NOTE — Telephone Encounter (Signed)
Requesting: Ambien Contract: 07/09/2013 UDS: Last OV: 08/14/2019 Next OV: 12/24/19 Last Refill: 10/27/2019, #30---0 RF Database:   Please advise

## 2019-12-03 ENCOUNTER — Other Ambulatory Visit: Payer: Self-pay | Admitting: Family Medicine

## 2019-12-03 ENCOUNTER — Telehealth: Payer: Self-pay

## 2019-12-03 ENCOUNTER — Other Ambulatory Visit: Payer: Self-pay

## 2019-12-03 DIAGNOSIS — I10 Essential (primary) hypertension: Secondary | ICD-10-CM

## 2019-12-03 DIAGNOSIS — G4700 Insomnia, unspecified: Secondary | ICD-10-CM

## 2019-12-03 DIAGNOSIS — R600 Localized edema: Secondary | ICD-10-CM

## 2019-12-03 MED ORDER — SPIRONOLACTONE-HCTZ 25-25 MG PO TABS: 1.0000 | ORAL_TABLET | Freq: Every day | ORAL | 0 refills | Status: DC

## 2019-12-03 MED ORDER — ZOLPIDEM TARTRATE 5 MG PO TABS: ORAL_TABLET | ORAL | 0 refills | Status: DC

## 2019-12-03 NOTE — Telephone Encounter (Signed)
Patient states she is completely out of Ambien.  zolpidem (AMBIEN) 5 MG tablet [[id number]]

## 2019-12-03 NOTE — Telephone Encounter (Signed)
Requesting: Ambien  Contract: 07/09/2013 UDS:  Last OV: 08/14/2019 Next OV: 12/24/2019 Last Refill: 10/27/2019, #30--0 RF Database:   Please advise

## 2019-12-03 NOTE — Telephone Encounter (Signed)
Patient called for refill on spironolactone. I told her she is over due for her check up and that I would refill it one more time, If she does not make an appointment we will not refill her meds.

## 2019-12-24 ENCOUNTER — Other Ambulatory Visit: Payer: Self-pay

## 2019-12-24 ENCOUNTER — Ambulatory Visit (INDEPENDENT_AMBULATORY_CARE_PROVIDER_SITE_OTHER): Payer: 59 | Admitting: Family Medicine

## 2019-12-24 ENCOUNTER — Encounter: Payer: Self-pay | Admitting: Family Medicine

## 2019-12-24 VITALS — BP 125/71 | HR 90 | Temp 98.6°F | Resp 16 | Ht 64.7 in | Wt 281.0 lb

## 2019-12-24 DIAGNOSIS — E039 Hypothyroidism, unspecified: Secondary | ICD-10-CM

## 2019-12-24 DIAGNOSIS — K635 Polyp of colon: Secondary | ICD-10-CM

## 2019-12-24 DIAGNOSIS — I10 Essential (primary) hypertension: Secondary | ICD-10-CM | POA: Diagnosis not present

## 2019-12-24 DIAGNOSIS — Z0000 Encounter for general adult medical examination without abnormal findings: Secondary | ICD-10-CM | POA: Diagnosis not present

## 2019-12-24 DIAGNOSIS — Z1159 Encounter for screening for other viral diseases: Secondary | ICD-10-CM | POA: Diagnosis not present

## 2019-12-24 DIAGNOSIS — Z23 Encounter for immunization: Secondary | ICD-10-CM

## 2019-12-24 DIAGNOSIS — A6004 Herpesviral vulvovaginitis: Secondary | ICD-10-CM | POA: Diagnosis not present

## 2019-12-24 MED ORDER — VALACYCLOVIR HCL 1 G PO TABS: 500.0000 mg | ORAL_TABLET | Freq: Three times a day (TID) | ORAL | 3 refills | Status: AC

## 2019-12-24 NOTE — Progress Notes (Signed)
Subjective:     Kelly [name] is a 54 y.o. female and is here for a comprehensive physical exam. The patient reports no problems.  She would like to go back to surgery to discuss removing lab band and getting gastric sleeve She also needs labs today  Social History   Socioeconomic History  . Marital status: Single    Spouse name: Not on file  . Number of children: 0  . Years of education: Not on file  . Highest education level: Not on file  Occupational History  . Occupation: CMA    Comment: [employer]    Tobacco Use  . Smoking status: Never Smoker  . Smokeless tobacco: Never Used  Vaping Use  . Vaping Use: Never used  Substance and Sexual Activity  . Alcohol use: Yes    Alcohol/week: 3.0 standard drinks    Types: 3 Glasses of wine per week    Comment: socially  . Drug use: No  . Sexual activity: Never  Other Topics Concern  . Not on file  Social History Narrative  . Not on file   Social Determinants of Health   Financial Resource Strain:   . Difficulty of Paying Living Expenses: Not on file  Food Insecurity:   . Worried About Charity fundraiser in the Last Year: Not on file  . Ran Out of Food in the Last Year: Not on file  Transportation Needs:   . Lack of Transportation (Medical): Not on file  . Lack of Transportation (Non-Medical): Not on file  Physical Activity:   . Days of Exercise per Week: Not on file  . Minutes of Exercise per Session: Not on file  Stress:   . Feeling of Stress : Not on file  Social Connections:   . Frequency of Communication with Friends and Family: Not on file  . Frequency of Social Gatherings with Friends and Family: Not on file  . Attends Religious Services: Not on file  . Active Member of Clubs or Organizations: Not on file  . Attends Archivist Meetings: Not on file  . Marital Status: Not on file  Intimate Partner Violence:   . Fear of Current or Ex-Partner: Not on file  . Emotionally Abused: Not on file  . Physically  Abused: Not on file  . Sexually Abused: Not on file   Health Maintenance  Topic Date Due  . Hepatitis C Screening  Never done  . COLONOSCOPY  03/03/2018  . COVID-19 Vaccine (2 - Pfizer 2-dose series) 06/24/2019  . INFLUENZA VACCINE  10/25/2019  . PAP SMEAR-Modifier  02/12/2020  . MAMMOGRAM  04/28/2020  . TETANUS/TDAP  06/01/2027  . HIV Screening  Completed    The following portions of the patient's history were reviewed and updated as appropriate:  She  has a past medical history of Anxiety, B12 deficiency, Breast cyst, Depression with anxiety, Edema, Heart murmur, Herpes, History of hiatal hernia, Hypothyroidism, Joint pain, Menopause, Morbid obesity ([place]) (2009), Ovarian cyst, PAC (premature atrial contraction), Pedunculated colonic polyp (03/04/15), Pneumonia, PONV (postoperative nausea and vomiting), PVC (premature ventricular contraction), Thyroid goiter, Uterine fibroid, and Vitamin D deficiency. She does not have any pertinent problems on file. She  has a past surgical history that includes Laparoscopic gastric banding (05-23-09); Liposuction (1995); and Laparoscopic right hemi colectomy (Right, 03/25/2015). Her family history includes Alzheimer's disease in her paternal grandfather; Aneurysm (age of onset: 42) in her mother; Diabetes in her paternal aunt; Heart disease in her maternal grandfather; Heart  disease (age of onset: 52) in her paternal grandmother; Hyperlipidemia in her father and mother; Hypertension in her father, maternal grandmother, mother, and paternal grandmother; Obesity in her father; Stroke in her mother; Thyroid disease in her mother. She  reports that she has never smoked. She has never used smokeless tobacco. She reports current alcohol use of about 3.0 standard drinks of alcohol per week. She reports that she does not use drugs. She has a current medication list which includes the following prescription(s): albuterol, alprazolam, cetirizine, vitamin d,  levothyroxine, naproxen sodium, potassium chloride sa, spironolactone-hydrochlorothiazide, tramadol, valacyclovir, zolpidem, and zolpidem. Current Outpatient Medications on File Prior to Visit  Medication Sig Dispense Refill  . albuterol (PROAIR HFA) 108 (90 Base) MCG/ACT inhaler Inhale 2 puffs into the lungs every 6 (six) hours as needed for wheezing or shortness of breath. 18 g 2  . ALPRAZolam (XANAX) 0.25 MG tablet Take 1 tablet (0.25 mg total) by mouth 3 (three) times daily as needed. for anxiety 90 tablet 0  . cetirizine (ZYRTEC) 10 MG tablet Take 1 tablet (10 mg total) by mouth daily. 30 tablet 11  . Cholecalciferol (VITAMIN D) 50 MCG (2000 UT) tablet Take 2,000 Units by mouth daily.    Marland Kitchen levothyroxine (EUTHYROX) 88 MCG tablet Take 1 tablet (88 mcg total) by mouth daily before breakfast. 30 tablet 3  . naproxen sodium (ALEVE) 220 MG tablet Take 220 mg by mouth as needed.    . potassium chloride SA (K-DUR) 20 MEQ tablet Take 1 tablet (20 mEq total) by mouth daily. 30 tablet 2  . spironolactone-hydrochlorothiazide (ALDACTAZIDE) 25-25 MG tablet Take 1 tablet by mouth daily. 90 tablet 0  . traMADol (ULTRAM) 50 MG tablet TAKE 1 TO 2 TABLETS BY MOUTH ONCE DAILY 60 tablet 0  . valACYclovir (VALTREX) 1000 MG tablet Take 500 mg by mouth daily.     Marland Kitchen zolpidem (AMBIEN) 5 MG tablet TAKE 1 TABLET BY MOUTH ONCE DAILY AT BEDTIME AS NEEDED FOR SLEEP 30 tablet 0  . zolpidem (AMBIEN) 5 MG tablet TAKE 1 TABLET BY MOUTH ONCE DAILY AT BEDTIME AS NEEDED FOR SLEEP 30 tablet 0   No current facility-administered medications on file prior to visit.   She is allergic to other..  Review of Systems Review of Systems  Constitutional: Negative for activity change, appetite change and fatigue.  HENT: Negative for hearing loss, congestion, tinnitus and ear discharge.  dentist q42m Eyes: Negative for visual disturbance (see optho q1y -- vision corrected to 20/20 with glasses).  Respiratory: Negative for cough, chest  tightness and shortness of breath.   Cardiovascular: Negative for chest pain, palpitations and leg swelling.  Gastrointestinal: Negative for abdominal pain, diarrhea, constipation and abdominal distention.  Genitourinary: Negative for urgency, frequency, decreased urine volume and difficulty urinating.  Musculoskeletal: Negative for back pain, arthralgias and gait problem.  Skin: Negative for color change, pallor and rash.  Neurological: Negative for dizziness, light-headedness, numbness and headaches.  Hematological: Negative for adenopathy. Does not bruise/bleed easily.  Psychiatric/Behavioral: Negative for suicidal ideas, confusion, sleep disturbance, self-injury, dysphoric mood, decreased concentration and agitation.       Objective:    BP 125/71 (BP Location: Left Arm, Patient Position: Sitting, Cuff Size: Large)   Pulse 90   Temp 98.6 F (37 C) (Oral)   Resp 16   Ht 5' 4.7" (1.643 m)   Wt 281 lb (127.5 kg)   LMP 03/21/2015   SpO2 97%   BMI 47.20 kg/m  General appearance: alert, cooperative, appears  stated age and no distress Head: Normocephalic, without obvious abnormality, atraumatic Eyes: negative findings: lids and lashes normal, conjunctivae and sclerae normal and pupils equal, round, reactive to light and accomodation Ears: normal TM's and external ear canals both ears Nose: Nares normal. Septum midline. Mucosa normal. No drainage or sinus tenderness. Throat: lips, mucosa, and tongue normal; teeth and gums normal Neck: no adenopathy, no carotid bruit, no JVD, supple, symmetrical, trachea midline and thyroid not enlarged, symmetric, no tenderness/mass/nodules Back: symmetric, no curvature. ROM normal. No CVA tenderness. Lungs: clear to auscultation bilaterally Breasts: gyn Heart: regular rate and rhythm, S1, S2 normal, no murmur, click, rub or gallop Abdomen: soft, non-tender; bowel sounds normal; no masses,  no organomegaly Pelvic: deferred Extremities: extremities  normal, atraumatic, no cyanosis or edema Pulses: 2+ and symmetric Skin: Skin color, texture, turgor normal. No rashes or lesions Lymph nodes: Cervical, supraclavicular, and axillary nodes normal. Neurologic: Alert and oriented X 3, normal strength and tone. Normal symmetric reflexes. Normal coordination and gait    Assessment:    Healthy female exam.      Plan:    ghm utd Check labs  See After Visit Summary for Counseling Recommendations    1. Preventative health care ghm utd Check labs  - Lipid panel - CBC with Differential/Platelet - TSH - Comprehensive metabolic panel - Microalbumin / creatinine urine ratio  2. Essential hypertension Well controlled, no changes to meds. Encouraged heart healthy diet such as the DASH diet and exercise as tolerated.  - Lipid panel - Comprehensive metabolic panel - Microalbumin / creatinine urine ratio  3. Hypothyroidism, unspecified type Check labs con't synthroid  - TSH  4. Herpes simplex vulvovaginitis stable - valACYclovir (VALTREX) 1000 MG tablet; Take 0.5 tablets (500 mg total) by mouth 3 (three) times daily.  Dispense: 30 tablet; Refill: 3  5. Polyp of colon, unspecified part of colon, unspecified type F/u GI - Ambulatory referral to Gastroenterology  6. Need for hepatitis C screening test   - Hepatitis C antibody  7. Need for influenza vaccination   - Flu Vaccine QUAD 36+ mos IM  8. Morbid obesity ([place])  She needs f/u lap band surgery---- she would like to discuss gastric sleeve - Ambulatory referral to General Surgery

## 2019-12-24 NOTE — Patient Instructions (Signed)

## 2019-12-25 ENCOUNTER — Other Ambulatory Visit: Payer: Self-pay | Admitting: Family Medicine

## 2019-12-25 DIAGNOSIS — E785 Hyperlipidemia, unspecified: Secondary | ICD-10-CM

## 2019-12-25 DIAGNOSIS — R809 Proteinuria, unspecified: Secondary | ICD-10-CM

## 2019-12-25 LAB — COMPREHENSIVE METABOLIC PANEL
AG Ratio: 1 (calc) (ref 1.0–2.5)
ALT: 12 U/L (ref 6–29)
AST: 14 U/L (ref 10–35)
Albumin: 3.8 g/dL (ref 3.6–5.1)
Alkaline phosphatase (APISO): 107 U/L (ref 37–153)
BUN: 11 mg/dL (ref 7–25)
CO2: 29 mmol/L (ref 20–32)
Calcium: 9.3 mg/dL (ref 8.6–10.4)
Chloride: 100 mmol/L (ref 98–110)
Creat: 1.05 mg/dL (ref 0.50–1.05)
Globulin: 3.7 g/dL (calc) (ref 1.9–3.7)
Glucose, Bld: 87 mg/dL (ref 65–99)
Potassium: 3.6 mmol/L (ref 3.5–5.3)
Sodium: 141 mmol/L (ref 135–146)
Total Bilirubin: 0.8 mg/dL (ref 0.2–1.2)
Total Protein: 7.5 g/dL (ref 6.1–8.1)

## 2019-12-25 LAB — LIPID PANEL
Cholesterol: 198 mg/dL (ref <200)
HDL: 51 mg/dL (ref >=50)
LDL Cholesterol (Calc): 126 mg/dL (calc) — ABNORMAL HIGH
Non-HDL Cholesterol (Calc): 147 mg/dL (calc) — ABNORMAL HIGH (ref <130)
Total CHOL/HDL Ratio: 3.9 (calc) (ref <5.0)
Triglycerides: 105 mg/dL (ref <150)

## 2019-12-25 LAB — MICROALBUMIN / CREATININE URINE RATIO
Creatinine, Urine: 347 mg/dL — ABNORMAL HIGH (ref 20–275)
Microalb Creat Ratio: 2 mcg/mg creat (ref <30)
Microalb, Ur: 0.7 mg/dL

## 2019-12-25 LAB — CBC WITH DIFFERENTIAL/PLATELET
Absolute Monocytes: 409 cells/uL (ref 200–950)
Basophils Absolute: 102 cells/uL (ref 0–200)
Basophils Relative: 1.4 %
Eosinophils Absolute: 248 cells/uL (ref 15–500)
Eosinophils Relative: 3.4 %
HCT: 40.3 % (ref 35.0–45.0)
Hemoglobin: 13.7 g/dL (ref 11.7–15.5)
Lymphs Abs: 2424 cells/uL (ref 850–3900)
MCH: 31.1 pg (ref 27.0–33.0)
MCHC: 34 g/dL (ref 32.0–36.0)
MCV: 91.6 fL (ref 80.0–100.0)
MPV: 11.6 fL (ref 7.5–12.5)
Monocytes Relative: 5.6 %
Neutro Abs: 4117 cells/uL (ref 1500–7800)
Neutrophils Relative %: 56.4 %
Platelets: 325 10*3/uL (ref 140–400)
RBC: 4.4 10*6/uL (ref 3.80–5.10)
RDW: 12.1 % (ref 11.0–15.0)
Total Lymphocyte: 33.2 %
WBC: 7.3 10*3/uL (ref 3.8–10.8)

## 2019-12-25 LAB — HEPATITIS C ANTIBODY
Hepatitis C Ab: NONREACTIVE
SIGNAL TO CUT-OFF: 0.02 (ref <1.00)

## 2019-12-25 LAB — TSH: TSH: 1.48 mIU/L

## 2020-01-08 ENCOUNTER — Other Ambulatory Visit: Payer: Self-pay | Admitting: Family Medicine

## 2020-01-08 DIAGNOSIS — G4700 Insomnia, unspecified: Secondary | ICD-10-CM

## 2020-01-09 NOTE — Telephone Encounter (Signed)
Requesting: ambein Contract:n/a UDS:n/a Last Visit:5/21 Next Visit: Last Refill:12/03/19  Please Advise

## 2020-01-19 ENCOUNTER — Encounter: Payer: Self-pay | Admitting: Gastroenterology

## 2020-02-11 ENCOUNTER — Encounter: Payer: Self-pay | Admitting: *Deleted

## 2020-02-11 DIAGNOSIS — A6000 Herpesviral infection of urogenital system, unspecified: Secondary | ICD-10-CM | POA: Insufficient documentation

## 2020-02-11 DIAGNOSIS — M1990 Unspecified osteoarthritis, unspecified site: Secondary | ICD-10-CM | POA: Insufficient documentation

## 2020-02-11 DIAGNOSIS — E049 Nontoxic goiter, unspecified: Secondary | ICD-10-CM | POA: Insufficient documentation

## 2020-02-16 ENCOUNTER — Other Ambulatory Visit: Payer: Self-pay | Admitting: Family Medicine

## 2020-02-16 DIAGNOSIS — R002 Palpitations: Secondary | ICD-10-CM

## 2020-02-16 DIAGNOSIS — R000 Tachycardia, unspecified: Secondary | ICD-10-CM

## 2020-02-16 NOTE — Telephone Encounter (Signed)
We will def need to get contract and uds next visit

## 2020-02-16 NOTE — Telephone Encounter (Signed)
Requesting: alprazolam 0.25mg  Contract: None UDS: None Last Visit: 12/24/2019 Next Visit: 06/23/2020  Last Refill: 06/15/19 #90 and 0RF Pt sig: 1 tab tid prn  Please Advise

## 2020-02-26 ENCOUNTER — Other Ambulatory Visit: Payer: Self-pay

## 2020-02-26 ENCOUNTER — Ambulatory Visit (AMBULATORY_SURGERY_CENTER): Payer: Self-pay | Admitting: *Deleted

## 2020-02-26 VITALS — Ht 64.7 in | Wt 274.0 lb

## 2020-02-26 DIAGNOSIS — Z86010 Personal history of colonic polyps: Secondary | ICD-10-CM

## 2020-02-26 MED ORDER — PLENVU 140 G PO SOLR: 1.0000 | ORAL | 0 refills | Status: DC

## 2020-02-26 NOTE — Progress Notes (Signed)
Fully vax'd plus booster   No egg or soy allergy known to patient  No issues with past sedation with any surgeries or procedures no intubation problems in the past  No FH of Malignant Hyperthermia No diet pills per patient No home 02 use per patient  No blood thinners per patient  Pt denies issues with constipation  No A fib or A flutter  EMMI video to pt or via [place] 19 guidelines implemented in PV today with Pt and RN   Plenvu  Coupon given to pt in PV today , Code to Pharmacy   Due to the COVID-19 pandemic we are asking patients to follow these guidelines. Please only bring one care partner. Please be aware that your care partner may wait in the car in the parking lot or if they feel like they will be too hot to wait in the car, they may wait in the lobby on the 4th floor. All care partners are required to wear a mask the entire time (we do not have any that we can provide them), they need to practice social distancing, and we will do a Covid check for all patient's and care partners when you arrive. Also we will check their temperature and your temperature. If the care partner waits in their car they need to stay in the parking lot the entire time and we will call them on their cell phone when the patient is ready for discharge so they can bring the car to the front of the building. Also all patient's will need to wear a mask into building.

## 2020-02-29 ENCOUNTER — Other Ambulatory Visit: Payer: Self-pay | Admitting: Family Medicine

## 2020-03-04 ENCOUNTER — Telehealth: Payer: Self-pay | Admitting: Gastroenterology

## 2020-03-04 DIAGNOSIS — Z86010 Personal history of colonic polyps: Secondary | ICD-10-CM

## 2020-03-04 MED ORDER — PLENVU 140 G PO SOLR: 1.0000 | Freq: Once | ORAL | 0 refills | Status: AC

## 2020-03-04 NOTE — Telephone Encounter (Signed)
Plenvu rx resent to pt's pharmacy per pt's request.  Patient called and notified.

## 2020-03-04 NOTE — Telephone Encounter (Signed)
Pt states her pharmacy has not received the prescription for the pt's [place].

## 2020-03-08 ENCOUNTER — Other Ambulatory Visit: Payer: Self-pay

## 2020-03-08 ENCOUNTER — Encounter: Payer: Self-pay | Admitting: Cardiology

## 2020-03-08 ENCOUNTER — Ambulatory Visit: Payer: PRIVATE HEALTH INSURANCE | Admitting: Cardiology

## 2020-03-08 VITALS — BP 139/79 | HR 91 | Resp 16 | Ht 64.0 in | Wt 272.0 lb

## 2020-03-08 DIAGNOSIS — R600 Localized edema: Secondary | ICD-10-CM

## 2020-03-08 DIAGNOSIS — I10 Essential (primary) hypertension: Secondary | ICD-10-CM

## 2020-03-08 DIAGNOSIS — R002 Palpitations: Secondary | ICD-10-CM

## 2020-03-08 DIAGNOSIS — I7389 Other specified peripheral vascular diseases: Secondary | ICD-10-CM

## 2020-03-08 DIAGNOSIS — Z6841 Body Mass Index (BMI) 40.0 and over, adult: Secondary | ICD-10-CM

## 2020-03-08 MED ORDER — FUROSEMIDE 40 MG PO TABS: 40.0000 mg | ORAL_TABLET | Freq: Every day | ORAL | 0 refills | Status: DC | PRN

## 2020-03-08 MED ORDER — SPIRONOLACTONE-HCTZ 25-25 MG PO TABS: 1.0000 | ORAL_TABLET | Freq: Every day | ORAL | 3 refills | Status: DC

## 2020-03-08 NOTE — Progress Notes (Signed)
Primary Physician/Referring:  Ann Held, DO  Patient ID: Kelly [name], female    DOB: January 24, 1966, 54 y.o.   MRN: [id number]  Chief Complaint  Patient presents with  . Bilateral leg edema  . Follow-up    HPI: Kelly [name]  is a 54 y.o. female  with chronic leg edema, history of palpitations, chronic and felt to be due to PVC and PAC, morbid obesity, and hyperlipidemia presents for annual follow-up. She had lost weight after Lap Band surgery. Unfortunately has gained her weight back.   States that she continues to have episodes of leg edema that is relatively well controlled with Aldactone/HCTZ combination but still takes furosemide once every week or every other week as needed.  No change in palpitations.  States that occasionally she notices her feet and toes to turn blue without any pain or discomfort.  Past Medical History:  Diagnosis Date  . Anxiety   . Arthritis   . B12 deficiency   . Breast cyst   . Depression with anxiety   . Edema   . Heart murmur   . Herpes   . History of hiatal hernia    repaur 2011   . Hyperlipidemia    flax seed no statins   . Hypothyroidism   . Joint pain   . Menopause   . Morbid obesity ([place]) 2009  . Ovarian cyst   . PAC (premature atrial contraction)   . Pedunculated colonic polyp 03/04/15   tubulovillous adenoma of transverse colon.   . Pneumonia    hx of as a child   . PONV (postoperative nausea and vomiting)   . PVC (premature ventricular contraction)   . Thyroid goiter   . Uterine fibroid   . Vitamin D deficiency     Past Surgical History:  Procedure Laterality Date  . COLON SURGERY     lipoma removed from intestine   . COLONOSCOPY    . LAPAROSCOPIC GASTRIC BANDING  05-23-09   dr Abran Cantor with hiatal hernia repair.   Marland Kitchen LAPAROSCOPIC RIGHT HEMI COLECTOMY Right 03/25/2015   Procedure: LAPAROSCOPIC ASSISTED RIGHT HEMI COLECTOMY, with removal of lap band fluid prior to prepping.;  Surgeon: Alphonsa Overall, MD;   Location: [facility];  Service: General;  Laterality: Right;  . LIPOSUCTION  1995  . POLYPECTOMY     Social History   Tobacco Use  . Smoking status: Never Smoker  . Smokeless tobacco: Never Used  Substance Use Topics  . Alcohol use: Yes    Alcohol/week: 3.0 standard drinks    Types: 3 Glasses of wine per week    Comment: socially   Marital Status: Single   Current Outpatient Medications on File Prior to Visit  Medication Sig Dispense Refill  . albuterol (PROAIR HFA) 108 (90 Base) MCG/ACT inhaler Inhale 2 puffs into the lungs every 6 (six) hours as needed for wheezing or shortness of breath. 18 g 2  . ALPRAZolam (XANAX) 0.25 MG tablet TAKE 1 TABLET BY MOUTH THREE TIMES DAILY AS NEEDED FOR ANXIETY 90 tablet 0  . cetirizine (ZYRTEC) 10 MG tablet Take 1 tablet (10 mg total) by mouth daily. 30 tablet 11  . Cholecalciferol (VITAMIN D) 50 MCG (2000 UT) tablet Take 2,000 Units by mouth daily.    . Flaxseed, Linseed, (FLAX SEED OIL PO) Take 1,300 mg by mouth daily.    . furosemide (LASIX) 40 MG tablet Take 40 mg by mouth as needed.    Marland Kitchen levothyroxine (EUTHYROX) 88 MCG  tablet Take 1 tablet (88 mcg total) by mouth daily before breakfast. 90 tablet 1  . naproxen sodium (ALEVE) 220 MG tablet Take 220 mg by mouth as needed.    Marland Kitchen spironolactone-hydrochlorothiazide (ALDACTAZIDE) 25-25 MG tablet Take 1 tablet by mouth daily. 90 tablet 0  . traMADol (ULTRAM) 50 MG tablet TAKE 1 TO 2 TABLETS BY MOUTH ONCE DAILY 60 tablet 0  . TURMERIC PO Take by mouth.    . valACYclovir (VALTREX) 1000 MG tablet Take 0.5 tablets (500 mg total) by mouth 3 (three) times daily. 30 tablet 3  . zolpidem (AMBIEN) 5 MG tablet TAKE 1 TABLET BY MOUTH ONCE DAILY AT BEDTIME AS NEEDED FOR SLEEP 30 tablet 0   No current facility-administered medications on file prior to visit.     Review of Systems  Cardiovascular: Positive for leg swelling and palpitations. Negative for chest pain and dyspnea on exertion.  Gastrointestinal:  Negative for melena.  All other systems reviewed and are negative.  Objective:  Blood pressure 139/79, pulse 91, resp. rate 16, height 5\' 4"  (1.626 m), weight 272 lb (123.4 kg), last menstrual period 03/21/2015, SpO2 98 %. Body mass index is 46.69 kg/m.  Vitals with BMI 03/08/2020 02/26/2020 12/24/2019  Height 5\' 4"  5' 4.7" 5' 4.7"  Weight 272 lbs 274 lbs 281 lbs  BMI 46.67 09.38 18.29  Systolic 937 - 169  Diastolic 79 - 71  Pulse 91 - 90     Physical Exam Constitutional:      General: She is not in acute distress.    Appearance: She is well-developed.     Comments: Morbidly obese  HENT:     Head: Atraumatic.  Eyes:     Conjunctiva/sclera: Conjunctivae normal.  Neck:     Thyroid: No thyromegaly.     Comments: Short neck and difficult to evaluate JVP Cardiovascular:     Rate and Rhythm: Normal rate and regular rhythm.     Pulses: Normal pulses and intact distal pulses.          Carotid pulses are 2+ on the right side and 2+ on the left side.      Dorsalis pedis pulses are 2+ on the right side and 2+ on the left side.       Posterior tibial pulses are 2+ on the right side and 2+ on the left side.     Heart sounds: Normal heart sounds. No murmur heard. No gallop.   Pulmonary:     Effort: Pulmonary effort is normal.     Breath sounds: Normal breath sounds.  Abdominal:     General: Bowel sounds are normal.     Palpations: Abdomen is soft.     Comments: Obese. Pannus present  Musculoskeletal:        General: Normal range of motion.     Cervical back: Neck supple.  Skin:    General: Skin is warm and dry.  Neurological:     Mental Status: She is alert.    Radiology: No results found. Laboratory Examination:    CMP Latest Ref Rng & Units 12/24/2019 06/16/2019 12/15/2018  Glucose 65 - 99 mg/dL 87 78 72  BUN 7 - 25 mg/dL 11 9 10   Creatinine 0.50 - 1.05 mg/dL 1.05 0.93 0.93  Sodium 135 - 146 mmol/L 141 135 140  Potassium 3.5 - 5.3 mmol/L 3.6 3.5 3.4(L)  Chloride 98 - 110  mmol/L 100 99 100  CO2 20 - 32 mmol/L 29 27 29   Calcium 8.6 -  10.4 mg/dL 9.3 9.5 9.3  Total Protein 6.1 - 8.1 g/dL 7.5 7.7 7.5  Total Bilirubin 0.2 - 1.2 mg/dL 0.8 0.7 1.1  Alkaline Phos 39 - 117 U/L - 86 100  AST 10 - 35 U/L 14 15 12   ALT 6 - 29 U/L 12 9 7    CBC Latest Ref Rng & Units 12/24/2019 06/16/2019 12/18/2017  WBC 3.8 - 10.8 Thousand/uL 7.3 8.0 6.4  Hemoglobin 11.7 - 15.5 g/dL 13.7 13.3 13.5  Hematocrit 35.0 - 45.0 % 40.3 39.5 40.3  Platelets 140 - 400 Thousand/uL 325 318.0 324.0   Lipid Panel     Component Value Date/Time   CHOL 198 12/24/2019 0958   TRIG 105 12/24/2019 0958   HDL 51 12/24/2019 0958   CHOLHDL 3.9 12/24/2019 0958   VLDL 31.0 06/16/2019 0725   LDLCALC 126 (H) 12/24/2019 0958   HEMOGLOBIN A1C Lab Results  Component Value Date   HGBA1C 5.3 01/09/2018   MPG 94 07/27/2016   TSH Recent Labs    06/16/19 0725 12/24/19 0958  TSH 2.78 1.48    Cardiac studies:   Echocardiogram 12/27/2017: Normal LV size, normal LV systolic function, EF 62-83%. No regional wall motion abnormality. Grade 1 diastolic dysfunction. Mild TR and mild pulmonary hypertension, PA pressure 34 mmHg. Possible bicuspid aortic valve.  EKG:   EKG 03/08/2020: Normal sinus rhythm at rate of 80 bpm, left atrial enlargement, normal axis. No evidence of ischemia, otherwise normal EKG. No significant change from 03/11/18.  Assessment:    Essential hypertension - Plan: EKG 12-Lead  Palpitations  Bilateral leg edema  Class 3 severe obesity due to excess calories without serious comorbidity with body mass index (BMI) of 45.0 to 49.9 in adult [facility]) Meds ordered this encounter  Medications  . furosemide (LASIX) 40 MG tablet    Sig: Take 1 tablet (40 mg total) by mouth daily as needed for edema.    Dispense:  60 tablet    Refill:  0    Medications Discontinued During This Encounter  Medication Reason  . furosemide (LASIX) 40 MG tablet Reorder    Recommendations:    Mrs. Kelly [name] is a 54 y.o. AAF patient  with chronic leg edema, history of palpitations, chronic and felt to be due to PVC and PAC, morbid obesity, and hyperlipidemia presents for annual follow-up. She had lost weight after Lap Band surgery. Unfortunately has gained her weight back.  Her palpitations are related to her PACs and PVCs and she is not really bothered by them.  Also obesity hypoventilation may be contributing.  She denies symptoms suggestive of OSA.  With regard to bilateral leg edema, very minimal ankle edema, again probably related to weight and probably restrictive lung disease.  Echocardiogram did not reveal any significant abnormality in 2019.  Although possibly bicuspid aortic valve, I do not hear any click, suspect poor echo window from obesity.  Unless there is a new murmur or any change in symptoms, do not think she needs a repeat echocardiogram.  I reviewed her external labs, she indeed does have mild hyperlipidemia.  In the absence of any other risk factors except obesity, could continue observation for now with encouragement to lose weight.  I refilled her prescription for furosemide, she will continue with Aldactazide.  Weight loss again discussed with the patient.  I did not make any changes to medications, will request Dr. [name] to take over her care, I will see her back on a as needed basis.  Adrian Prows, MD, [facility] 03/08/2020, 3:19 PM Office: [phone] Pager: [phone]

## 2020-03-11 ENCOUNTER — Encounter: Payer: Self-pay | Admitting: Gastroenterology

## 2020-03-23 ENCOUNTER — Encounter: Payer: Self-pay | Admitting: Gastroenterology

## 2020-03-23 ENCOUNTER — Ambulatory Visit (AMBULATORY_SURGERY_CENTER): Payer: 59 | Admitting: Gastroenterology

## 2020-03-23 ENCOUNTER — Other Ambulatory Visit: Payer: Self-pay

## 2020-03-23 VITALS — BP 116/70 | HR 71 | Temp 97.3°F | Resp 17 | Ht 64.0 in | Wt 274.0 lb

## 2020-03-23 DIAGNOSIS — Z86010 Personal history of colonic polyps: Secondary | ICD-10-CM | POA: Diagnosis not present

## 2020-03-23 MED ORDER — SODIUM CHLORIDE 0.9 % IV SOLN: 500.0000 mL | Freq: Once | INTRAVENOUS | Status: DC

## 2020-03-23 NOTE — Progress Notes (Signed)
Pt's states no medical or surgical changes since previsit or office visit. 

## 2020-03-23 NOTE — Patient Instructions (Signed)
YOU HAD AN ENDOSCOPIC PROCEDURE TODAY AT THE [facility]:   Refer to the procedure report that was given to you for any specific questions about what was found during the examination.  If the procedure report does not answer your questions, please call your gastroenterologist to clarify.  If you requested that your care partner not be given the details of your procedure findings, then the procedure report has been included in a sealed envelope for you to review at your convenience later.  YOU SHOULD EXPECT: Some feelings of bloating in the abdomen. Passage of more gas than usual.  Walking can help get rid of the air that was put into your GI tract during the procedure and reduce the bloating. If you had a lower endoscopy (such as a colonoscopy or flexible sigmoidoscopy) you may notice spotting of blood in your stool or on the toilet paper. If you underwent a bowel prep for your procedure, you may not have a normal bowel movement for a few days.  Please Note:  You might notice some irritation and congestion in your nose or some drainage.  This is from the oxygen used during your procedure.  There is no need for concern and it should clear up in a day or so.  SYMPTOMS TO REPORT IMMEDIATELY:   Following lower endoscopy (colonoscopy or flexible sigmoidoscopy):  Excessive amounts of blood in the stool  Significant tenderness or worsening of abdominal pains  Swelling of the abdomen that is new, acute  Fever of 100F or higher  For urgent or emergent issues, a gastroenterologist can be reached at any hour by calling [phone]. Do not use MyChart messaging for urgent concerns.    DIET:  We do recommend a small meal at first, but then you may proceed to your regular diet.  Drink plenty of fluids but you should avoid alcoholic beverages for 24 hours.  ACTIVITY:  You should plan to take it easy for the rest of today and you should NOT DRIVE or use heavy machinery until tomorrow (because  of the sedation medicines used during the test).    FOLLOW UP: Our staff will call the number listed on your records 48-72 hours following your procedure to check on you and address any questions or concerns that you may have regarding the information given to you following your procedure. If we do not reach you, we will leave a message.  We will attempt to reach you two times.  During this call, we will ask if you have developed any symptoms of COVID 19. If you develop any symptoms (ie: fever, flu-like symptoms, shortness of breath, cough etc.) before then, please call [phone].  If you test positive for Covid 19 in the 2 weeks post procedure, please call and report this information to us.    If any biopsies were taken you will be contacted by phone or by letter within the next 1-3 weeks.  Please call us at [phone] if you have not heard about the biopsies in 3 weeks.    SIGNATURES/CONFIDENTIALITY: You and/or your care partner have signed paperwork which will be entered into your electronic medical record.  These signatures attest to the fact that that the information above on your After Visit Summary has been reviewed and is understood.  Full responsibility of the confidentiality of this discharge information lies with you and/or your care-partner. 

## 2020-03-23 NOTE — Progress Notes (Signed)
A.G. vital signs. 

## 2020-03-23 NOTE — Op Note (Signed)
[facility] Patient Name: Kelly [name] Procedure Date: 03/23/2020 10:25 AM MRN: [id number] Endoscopist: Rachael Fee , MD Age: 54 Referring MD:  Date of Birth: December 10, 1965 Gender: Female Account #: [id number] Procedure:                Colonoscopy Indications:              High risk colon cancer surveillance: Personal                            history of colonic polyps; Colonoscopy 2016 one                            72mm TVA was removed from transverse colon; 2016                            lap right hemicolectomy for inermittent obstructing                            5cm ascending colon lipoma Medicines:                Monitored Anesthesia Care Procedure:                Pre-Anesthesia Assessment:                           - Prior to the procedure, a History and Physical                            was performed, and patient medications and                            allergies were reviewed. The patient's tolerance of                            previous anesthesia was also reviewed. The risks                            and benefits of the procedure and the sedation                            options and risks were discussed with the patient.                            All questions were answered, and informed consent                            was obtained. Prior Anticoagulants: The patient has                            taken no previous anticoagulant or antiplatelet                            agents. ASA Grade Assessment: III - A patient with  severe systemic disease. After reviewing the risks                            and benefits, the patient was deemed in                            satisfactory condition to undergo the procedure.                           After obtaining informed consent, the colonoscope                            was passed under direct vision. Throughout the                            procedure, the patient's blood  pressure, pulse, and                            oxygen saturations were monitored continuously. The                            Colonoscope was introduced through the anus and                            advanced to the the ileocolonic anastomosis. The                            colonoscopy was performed without difficulty. The                            patient tolerated the procedure well. The quality                            of the bowel preparation was good. The anastomosis                            and rectum were photographed. Scope In: 10:27:59 AM Scope Out: 10:35:03 AM Scope Withdrawal Time: 0 hours 5 minutes 28 seconds  Total Procedure Duration: 0 hours 7 minutes 4 seconds  Findings:                 Normal ileocecal anatomosis from 2016 lap right                            hemocolectomy.                           Otherwise normal examination.                           No polyps or cancers. Complications:            No immediate complications. Estimated blood loss:                            None. Estimated Blood Loss:  Estimated blood loss: none. Impression:               - The entire examined colon was normal.                           - No polyps or cancers. Recommendation:           - Patient has a contact number available for                            emergencies. The signs and symptoms of potential                            delayed complications were discussed with the                            patient. Return to normal activities tomorrow.                            Written discharge instructions were provided to the                            patient.                           - Resume previous diet.                           - Continue present medications.                           - Repeat colonoscopy in 5 years for surveillance. Milus Banister, MD 03/23/2020 10:39:27 AM This report has been signed electronically.

## 2020-03-23 NOTE — Progress Notes (Signed)
A and O x3. Report to RN. Tolerated MAC anesthesia well.

## 2020-03-28 ENCOUNTER — Telehealth: Payer: Self-pay | Admitting: *Deleted

## 2020-03-28 NOTE — Telephone Encounter (Signed)
  Follow up Call-  Call back number 03/23/2020  Post procedure Call Back phone  # [phone]  Permission to leave phone message Yes  Some recent data might be hidden     Patient questions:  Do you have a fever, pain , or abdominal swelling? No. Pain Score  0 *  Have you tolerated food without any problems? Yes.    Have you been able to return to your normal activities? Yes.    Do you have any questions about your discharge instructions: Diet   No. Medications  No. Follow up visit  No.  Do you have questions or concerns about your Care? No.  Actions: * If pain score is 4 or above: No action needed, pain <4.  1. Have you developed a fever since your procedure? no  2.   Have you had an respiratory symptoms (SOB or cough) since your procedure? no  3.   Have you tested positive for COVID 19 since your procedure no  4.   Have you had any family members/close contacts diagnosed with the COVID 19 since your procedure?  no   If yes to any of these questions please route to Laverna Peace, RN and Karlton Lemon, RN

## 2020-04-05 ENCOUNTER — Other Ambulatory Visit: Payer: Self-pay | Admitting: Family Medicine

## 2020-04-05 DIAGNOSIS — G4700 Insomnia, unspecified: Secondary | ICD-10-CM

## 2020-04-05 DIAGNOSIS — R002 Palpitations: Secondary | ICD-10-CM

## 2020-04-05 DIAGNOSIS — R000 Tachycardia, unspecified: Secondary | ICD-10-CM

## 2020-04-05 NOTE — Telephone Encounter (Signed)
Requesting:  Zolpidem and alprazolam Contract: none (will get at next ov) UDS: none  (will get at next ov) Last Visit: 12/24/19 Next Visit: 06/24/19 Last Refill: zolpidem 12/03/19 alprazolam 02/16/20  Please Advise

## 2020-04-06 ENCOUNTER — Telehealth: Payer: Self-pay | Admitting: Family Medicine

## 2020-04-06 ENCOUNTER — Other Ambulatory Visit: Payer: Self-pay

## 2020-04-06 ENCOUNTER — Telehealth: Payer: Self-pay | Admitting: Cardiology

## 2020-04-06 MED ORDER — LEVOTHYROXINE SODIUM 88 MCG PO TABS: 88.0000 ug | ORAL_TABLET | Freq: Every day | ORAL | 1 refills | Status: DC

## 2020-04-06 NOTE — Telephone Encounter (Signed)
Refill sent.

## 2020-04-06 NOTE — Telephone Encounter (Signed)
Patient is requesting a 90 Day Supply of medication below, patient states her insurance lapse on Friday 04/08/2020.    Medication: levothyroxine (EUTHYROX) 88 MCG tablet [[id number]   Has the patient contacted their pharmacy? No. (If no, request that the patient contact the pharmacy for the refill.) (If yes, when and what did the pharmacy advise?)  Preferred Pharmacy (with phone number or street name):  [facility] [address] [place], [place] - [address]  [address] [place] [address]  Phone:  [phone] Fax:  [phone]  DEA #:  --  [id number] Reason: --     Agent: Please be advised that RX refills may take up to 3 business days. We ask that you follow-up with your pharmacy.

## 2020-04-07 ENCOUNTER — Other Ambulatory Visit: Payer: Self-pay

## 2020-04-07 DIAGNOSIS — R600 Localized edema: Secondary | ICD-10-CM

## 2020-04-07 DIAGNOSIS — I10 Essential (primary) hypertension: Secondary | ICD-10-CM

## 2020-04-07 MED ORDER — SPIRONOLACTONE-HCTZ 25-25 MG PO TABS: 1.0000 | ORAL_TABLET | Freq: Every day | ORAL | 0 refills | Status: DC

## 2020-04-07 MED ORDER — FUROSEMIDE 40 MG PO TABS: 40.0000 mg | ORAL_TABLET | Freq: Every day | ORAL | 0 refills | Status: DC | PRN

## 2020-04-07 NOTE — Telephone Encounter (Signed)
Patient called stating they have not received the prescription refill    levothyroxine [facility]) 88 MCG tablet [[id number]]    [facility], [place]  [address], [place] [address]  Phone:  [phone] Fax:  [phone]  DEA #:  --

## 2020-04-07 NOTE — Telephone Encounter (Signed)
Refills were sent on 04/06/2020.

## 2020-06-23 ENCOUNTER — Other Ambulatory Visit: Payer: Self-pay | Admitting: Family Medicine

## 2020-06-23 ENCOUNTER — Ambulatory Visit: Payer: 59 | Admitting: Family Medicine

## 2020-06-23 DIAGNOSIS — G4700 Insomnia, unspecified: Secondary | ICD-10-CM

## 2020-06-23 NOTE — Telephone Encounter (Signed)
Requesting: Ambien  Contract: 2015 UDS: 2015 Last OV: 06/23/2019 Next OV: N/A Last Refill: 04/05/2020, #30--0 RF Database:   Please advise

## 2020-08-14 ENCOUNTER — Other Ambulatory Visit: Payer: Self-pay | Admitting: Family Medicine

## 2020-08-14 DIAGNOSIS — G4700 Insomnia, unspecified: Secondary | ICD-10-CM

## 2020-08-15 NOTE — Telephone Encounter (Signed)
Requesting: Ambien  Contract: 2015 UDS: 2015 Last OV: 12/24/2019 Next OV: N/A Last Refill:  06/23/2020, #30--0 RF Database:   Please advise

## 2020-10-18 ENCOUNTER — Other Ambulatory Visit: Payer: Self-pay | Admitting: Family Medicine

## 2020-10-18 ENCOUNTER — Other Ambulatory Visit: Payer: Self-pay | Admitting: Cardiology

## 2020-10-18 DIAGNOSIS — M1990 Unspecified osteoarthritis, unspecified site: Secondary | ICD-10-CM

## 2020-10-18 DIAGNOSIS — G4700 Insomnia, unspecified: Secondary | ICD-10-CM

## 2020-10-18 DIAGNOSIS — R600 Localized edema: Secondary | ICD-10-CM

## 2020-10-18 NOTE — Telephone Encounter (Signed)
Requesting: tramadol and zolpidem Contract: none UDS: none Last Visit: 12/24/19 Next Visit: none Last Refill: tramadol 03/12/19 and zolpidem 08/15/20  Please Advise

## 2020-11-29 ENCOUNTER — Other Ambulatory Visit: Payer: Self-pay | Admitting: Family Medicine

## 2020-11-29 DIAGNOSIS — G4700 Insomnia, unspecified: Secondary | ICD-10-CM

## 2020-11-30 NOTE — Telephone Encounter (Signed)
Requesting: Ambien  Contract: 2015 UDS: 2015 Last OV: 12/24/2019 Next OV: N/A Last Refill: 10/18/20, #30---0 RF Database:   Please advise

## 2021-02-03 ENCOUNTER — Telehealth: Payer: Self-pay | Admitting: Family Medicine

## 2021-02-03 NOTE — Telephone Encounter (Signed)
Verbal given 

## 2021-02-03 NOTE — Telephone Encounter (Signed)
Tyler from [facility] called to state they no longer carry levothyroxine (EUTHYROX) 88 MCG tablet  and they need approval for the new generic brand, they can be reached at [phone].

## 2021-05-01 ENCOUNTER — Other Ambulatory Visit: Payer: Self-pay | Admitting: Cardiology

## 2021-05-01 ENCOUNTER — Other Ambulatory Visit: Payer: Self-pay | Admitting: Family Medicine

## 2021-05-01 DIAGNOSIS — R600 Localized edema: Secondary | ICD-10-CM

## 2021-05-01 DIAGNOSIS — R000 Tachycardia, unspecified: Secondary | ICD-10-CM

## 2021-05-01 DIAGNOSIS — M1990 Unspecified osteoarthritis, unspecified site: Secondary | ICD-10-CM

## 2021-05-01 DIAGNOSIS — R002 Palpitations: Secondary | ICD-10-CM

## 2021-05-05 ENCOUNTER — Other Ambulatory Visit: Payer: Self-pay | Admitting: Family Medicine

## 2021-05-05 DIAGNOSIS — R002 Palpitations: Secondary | ICD-10-CM

## 2021-05-05 DIAGNOSIS — M1990 Unspecified osteoarthritis, unspecified site: Secondary | ICD-10-CM

## 2021-05-05 DIAGNOSIS — R000 Tachycardia, unspecified: Secondary | ICD-10-CM

## 2021-05-05 DIAGNOSIS — G4700 Insomnia, unspecified: Secondary | ICD-10-CM

## 2021-05-05 NOTE — Telephone Encounter (Signed)
Patient states she does not currently have insurance and cannot come in to the office but would like a refill on these medications.  Medication: 1.zolpidem (AMBIEN) 5 MG tablet 2.traMADol (ULTRAM) 50 MG tablet  3.levothyroxine (EUTHYROX) 88 MCG tablet  4.ALPRAZolam (XANAX) 0.25 MG tablet   Has the patient contacted their pharmacy? Yes.    Preferred Pharmacy (with phone number or street name):  [facility] [address] [place], [place] - [address]  [address] [place] [address]  Phone:  [phone]  Fax:  [phone]   Agent: Please be advised that RX refills may take up to 3 business days. We ask that you follow-up with your pharmacy.

## 2021-05-08 MED ORDER — LEVOTHYROXINE SODIUM 88 MCG PO TABS: 88.0000 ug | ORAL_TABLET | Freq: Every day | ORAL | 1 refills | Status: DC

## 2021-05-08 NOTE — Telephone Encounter (Signed)
*  Pt reports she does not have insurance currently*   Requesting: Ambien, Tramadol, Xanax  Contract: N/A UDS: N/A Last OV: 12/24/2019 Next OV: N/A Last Refill: 11/30/20, #30--0 RF, 10/18/2020, #60--0 RF, 04/05/2020, #90--0 RF  Database:   Please advise

## 2021-05-09 MED ORDER — ALPRAZOLAM 0.25 MG PO TABS: 0.2500 mg | ORAL_TABLET | Freq: Three times a day (TID) | ORAL | 0 refills | Status: DC | PRN

## 2021-05-09 MED ORDER — TRAMADOL HCL 50 MG PO TABS: ORAL_TABLET | ORAL | 0 refills | Status: DC

## 2021-05-09 MED ORDER — ZOLPIDEM TARTRATE 5 MG PO TABS: ORAL_TABLET | ORAL | 0 refills | Status: AC

## 2022-07-02 ENCOUNTER — Other Ambulatory Visit (HOSPITAL_BASED_OUTPATIENT_CLINIC_OR_DEPARTMENT_OTHER): Payer: Self-pay

## 2022-07-02 MED ORDER — ZEPBOUND 2.5 MG/0.5ML ~~LOC~~ SOAJ
2.5000 mg | SUBCUTANEOUS | 0 refills | Status: DC
  Filled 2022-07-02 – 2022-12-28 (×3): qty 2, 28d supply, fill #0

## 2022-07-03 ENCOUNTER — Other Ambulatory Visit (HOSPITAL_BASED_OUTPATIENT_CLINIC_OR_DEPARTMENT_OTHER): Payer: Self-pay

## 2022-07-05 ENCOUNTER — Other Ambulatory Visit (HOSPITAL_BASED_OUTPATIENT_CLINIC_OR_DEPARTMENT_OTHER): Payer: Self-pay

## 2022-11-02 ENCOUNTER — Ambulatory Visit: Payer: Managed Care, Other (non HMO) | Admitting: Family Medicine

## 2022-11-02 ENCOUNTER — Encounter: Payer: Self-pay | Admitting: Family Medicine

## 2022-11-02 VITALS — BP 172/66 | HR 86 | Temp 98.1°F | Resp 16 | Ht 64.0 in | Wt 289.0 lb

## 2022-11-02 DIAGNOSIS — K439 Ventral hernia without obstruction or gangrene: Secondary | ICD-10-CM

## 2022-11-02 DIAGNOSIS — E039 Hypothyroidism, unspecified: Secondary | ICD-10-CM | POA: Diagnosis not present

## 2022-11-02 DIAGNOSIS — I700 Atherosclerosis of aorta: Secondary | ICD-10-CM

## 2022-11-02 DIAGNOSIS — R102 Pelvic and perineal pain: Secondary | ICD-10-CM | POA: Insufficient documentation

## 2022-11-02 LAB — CBC WITH DIFFERENTIAL/PLATELET
Absolute Monocytes: 399 cells/uL (ref 200–950)
Basophils Absolute: 105 cells/uL (ref 0–200)
Basophils Relative: 1.1 %
Eosinophils Absolute: 304 cells/uL (ref 15–500)
Eosinophils Relative: 3.2 %
HCT: 40.1 % (ref 35.0–45.0)
Hemoglobin: 13.3 g/dL (ref 11.7–15.5)
Lymphs Abs: 2822 cells/uL (ref 850–3900)
MCH: 30.4 pg (ref 27.0–33.0)
MCHC: 33.2 g/dL (ref 32.0–36.0)
MCV: 91.6 fL (ref 80.0–100.0)
MPV: 11.8 fL (ref 7.5–12.5)
Monocytes Relative: 4.2 %
Neutro Abs: 5871 cells/uL (ref 1500–7800)
Neutrophils Relative %: 61.8 %
Platelets: 356 10*3/uL (ref 140–400)
RBC: 4.38 10*6/uL (ref 3.80–5.10)
RDW: 12.3 % (ref 11.0–15.0)
Total Lymphocyte: 29.7 %
WBC: 9.5 10*3/uL (ref 3.8–10.8)

## 2022-11-02 NOTE — Progress Notes (Signed)
Established Patient Office Visit  Subjective   Patient ID: Kelly [name], female    DOB: 1965/08/06  Age: 57 y.o. MRN: [id number]  Chief Complaint  Patient presents with   Abdominal Pain    Complains of left sided low abdominal pain    HPI Discussed the use of AI scribe software for clinical note transcription with the patient, who gave verbal consent to proceed.  History of Present Illness   The patient, with a history of an ovarian cyst and fibroids, presents with abdominal pain that started the previous day around 4 PM. The pain is sporadic and located in the same area as her known cyst. She has not had any bleeding, discharge, or urinary symptoms. She also mentions a possible hernia in the area where she had a lipoma removed in 2016. The hernia is sometimes tender, especially when she wears a belt. She has not been taking levothyroxine for a couple of years and stopped taking spironolactone due to muscle cramps. She occasionally takes Lasix for ankle swelling.      Patient Active Problem List   Diagnosis Date Noted   Pelvic pain 11/02/2022   Ventral hernia without obstruction or gangrene 11/02/2022   Aortic atherosclerosis (HCC) 11/02/2022   Genital herpes simplex 02/11/2020   Arthritis 02/11/2020   Goiter 02/11/2020   SOB (shortness of breath) 08/14/2019   Non-cardiac chest pain 07/18/2018   Bilateral leg edema 12/17/2017   Hypothyroidism 10/29/2017   Insomnia 10/29/2017   Hives 01/13/2017   Obesity, Class III, BMI 40-49.9 (morbid obesity) (HCC) 01/06/2016   Intussusception of colon (HCC) 03/25/2015   Gastrointestinal multiple polyposis syndrome 03/22/2015   Preventative health care 01/08/2015   Palpitations 05/18/2014   Knee pain 10/09/2013   Popliteal pain 10/09/2013   Cellulitis and abscess of buttock 02/15/2013   LAP-BAND surgery status 07/01/2012   Onychomycosis 08/15/2011   Lapband APS Feb 2011 06/01/2011   Depression 01/15/2011   CONTACT DERMATITIS  09/13/2009   NECK AND BACK PAIN 04/14/2009   SKIN TAG 12/21/2008   ARTHRITIS 08/03/2008   Pain in joint, lower leg 09/17/2007   Essential hypertension 06/17/2007   EDEMA 06/17/2007   Anxiety state 08/08/2006   ALLERGIC RHINITIS 08/08/2006   Past Medical History:  Diagnosis Date   Anxiety    Arthritis    B12 deficiency    Breast cyst    Depression with anxiety    Edema    Heart murmur    Herpes    History of hiatal hernia    repaur 2011    Hyperlipidemia    flax seed no statins    Hypothyroidism    Joint pain    Menopause    Morbid obesity (HCC) 2009   Ovarian cyst    PAC (premature atrial contraction)    Pedunculated colonic polyp 03/04/15   tubulovillous adenoma of transverse colon.    Pneumonia    hx of as a child    PONV (postoperative nausea and vomiting)    PVC (premature ventricular contraction)    Thyroid goiter    Uterine fibroid    Vitamin D deficiency    Past Surgical History:  Procedure Laterality Date   COLON SURGERY     lipoma removed from intestine    COLONOSCOPY     LAPAROSCOPIC GASTRIC BANDING  05-23-09   dr Odie Sera with hiatal hernia repair.    LAPAROSCOPIC RIGHT HEMI COLECTOMY Right 03/25/2015   Procedure: LAPAROSCOPIC ASSISTED RIGHT HEMI COLECTOMY, with removal of lap  band fluid prior to prepping.;  Surgeon: Ovidio Kin, MD;  Location: [facility];  Service: General;  Laterality: Right;   LIPOSUCTION  1995   POLYPECTOMY     Social History   Tobacco Use   Smoking status: Never   Smokeless tobacco: Never  Vaping Use   Vaping status: Never Used  Substance Use Topics   Alcohol use: Yes    Alcohol/week: 3.0 standard drinks of alcohol    Types: 3 Glasses of wine per week    Comment: socially   Drug use: No   Social History   Socioeconomic History   Marital status: Single    Spouse name: Not on file   Number of children: 0   Years of education: Not on file   Highest education level: Not on file  Occupational History   Occupation: CMA     Comment: CCM    Tobacco Use   Smoking status: Never   Smokeless tobacco: Never  Vaping Use   Vaping status: Never Used  Substance and Sexual Activity   Alcohol use: Yes    Alcohol/week: 3.0 standard drinks of alcohol    Types: 3 Glasses of wine per week    Comment: socially   Drug use: No   Sexual activity: Never  Other Topics Concern   Not on file  Social History Narrative   Not on file   Social Determinants of Health   Financial Resource Strain: Not on file  Food Insecurity: Not on file  Transportation Needs: Not on file  Physical Activity: Inactive (05/31/2017)   Exercise Vital Sign    Days of Exercise per Week: 0 days    Minutes of Exercise per Session: 0 min  Stress: Not on file  Social Connections: Not on file  Intimate Partner Violence: Not on file   Family Status  Relation Name Status   Mother  Alive   Father  Alive   MGM  Alive   PGM  Deceased at age 58       MI   Pat Aunt  Alive   MGF  Deceased   PGF  Deceased at age 87       dementia   Neg Hx  (Not Specified)  No partnership data on file   Family History  Problem Relation Age of Onset   Hypertension Mother    Aneurysm Mother 87       brain   Hyperlipidemia Mother    Stroke Mother    Thyroid disease Mother    Hypertension Father    Hyperlipidemia Father    Obesity Father    Colon polyps Father    Hypertension Maternal Grandmother    Hypertension Paternal Grandmother    Heart disease Paternal Grandmother 41       MI   Diabetes Paternal Aunt    Heart disease Maternal Grandfather        MI   Alzheimer's disease Paternal Grandfather    Colon cancer Neg Hx    Esophageal cancer Neg Hx    Rectal cancer Neg Hx    Stomach cancer Neg Hx    No Known Allergies    Review of Systems  Constitutional:  Negative for fever and malaise/fatigue.  HENT:  Negative for congestion.   Eyes:  Negative for blurred vision.  Respiratory:  Negative for cough and shortness of breath.   Cardiovascular:   Negative for chest pain, palpitations and leg swelling.  Gastrointestinal:  Positive for abdominal pain. Negative for constipation and  vomiting.  Musculoskeletal:  Negative for back pain.  Skin:  Negative for rash.  Neurological:  Negative for loss of consciousness and headaches.      Objective:     BP (!) 172/66 (BP Location: Left Arm, Patient Position: Sitting, Cuff Size: Large)   Pulse 86   Temp 98.1 F (36.7 C) (Oral)   Resp 16   Ht 5\' 4"  (1.626 m)   Wt 289 lb (131.1 kg)   LMP 03/21/2015   SpO2 99%   BMI 49.61 kg/m  BP Readings from Last 3 Encounters:  11/02/22 (!) 172/66  03/23/20 116/70  03/08/20 139/79   Wt Readings from Last 3 Encounters:  11/02/22 289 lb (131.1 kg)  03/23/20 274 lb (124.3 kg)  03/08/20 272 lb (123.4 kg)   SpO2 Readings from Last 3 Encounters:  11/02/22 99%  03/23/20 100%  03/08/20 98%      Physical Exam Vitals and nursing note reviewed.  Constitutional:      General: She is not in acute distress.    Appearance: Normal appearance. She is well-developed.  HENT:     Head: Normocephalic and atraumatic.     Right Ear: Tympanic membrane, ear canal and external ear normal. There is no impacted cerumen.     Left Ear: Tympanic membrane, ear canal and external ear normal. There is no impacted cerumen.     Nose: Nose normal. No congestion or rhinorrhea.     Right Sinus: No maxillary sinus tenderness or frontal sinus tenderness.     Left Sinus: No maxillary sinus tenderness or frontal sinus tenderness.     Mouth/Throat:     Mouth: Mucous membranes are moist.     Pharynx: Oropharynx is clear. No oropharyngeal exudate or posterior oropharyngeal erythema.  Eyes:     General: No scleral icterus.       Right eye: No discharge.        Left eye: No discharge.     Conjunctiva/sclera: Conjunctivae normal.  Cardiovascular:     Rate and Rhythm: Normal rate and regular rhythm.     Heart sounds: Normal heart sounds. No murmur heard. Pulmonary:      Effort: Pulmonary effort is normal. No respiratory distress.     Breath sounds: Normal breath sounds.  Abdominal:     Tenderness: There is abdominal tenderness in the left lower quadrant. There is no guarding or rebound.     Hernia: A hernia is present. Hernia is present in the ventral area.  Musculoskeletal:        General: Normal range of motion.     Cervical back: Normal range of motion and neck supple.     Right lower leg: No edema.     Left lower leg: No edema.  Lymphadenopathy:     Cervical: No cervical adenopathy.  Skin:    General: Skin is warm and dry.  Neurological:     Mental Status: She is alert and oriented to person, place, and time.  Psychiatric:        Mood and Affect: Mood normal.        Behavior: Behavior normal.        Thought Content: Thought content normal.        Judgment: Judgment normal.      No results found for any visits on 11/02/22.  Last CBC Lab Results  Component Value Date   WBC 7.3 12/24/2019   HGB 13.7 12/24/2019   HCT 40.3 12/24/2019   MCV 91.6 12/24/2019  MCH 31.1 12/24/2019   RDW 12.1 12/24/2019   PLT 325 12/24/2019   Last metabolic panel Lab Results  Component Value Date   GLUCOSE 87 12/24/2019   NA 141 12/24/2019   K 3.6 12/24/2019   CL 100 12/24/2019   CO2 29 12/24/2019   BUN 11 12/24/2019   CREATININE 1.05 12/24/2019   GFR 75.83 06/16/2019   CALCIUM 9.3 12/24/2019   PROT 7.5 12/24/2019   ALBUMIN 3.8 06/16/2019   BILITOT 0.8 12/24/2019   ALKPHOS 86 06/16/2019   AST 14 12/24/2019   ALT 12 12/24/2019   ANIONGAP 8 03/26/2015   Last lipids Lab Results  Component Value Date   CHOL 198 12/24/2019   HDL 51 12/24/2019   LDLCALC 126 (H) 12/24/2019   TRIG 105 12/24/2019   CHOLHDL 3.9 12/24/2019   Last hemoglobin A1c Lab Results  Component Value Date   HGBA1C 5.3 01/09/2018   Last thyroid functions Lab Results  Component Value Date   TSH 1.48 12/24/2019   T4TOTAL 9.5 06/16/2019   Last vitamin D Lab Results   Component Value Date   VD25OH 23.80 (L) 06/16/2019   Last vitamin B12 and Folate Lab Results  Component Value Date   VITAMINB12 139 (L) 06/16/2019   FOLATE 6.3 01/09/2018      The 10-year ASCVD risk score (Arnett DK, et al., 2019) is: 15.3%    Assessment & Plan:   Problem List Items Addressed This Visit       Unprioritized   Hypothyroidism   Relevant Orders   Comprehensive metabolic panel   TSH   Ventral hernia without obstruction or gangrene   Relevant Orders   Ambulatory referral to General Surgery   Pelvic pain - Primary    Check labs and ua US pelvis-- to check on ovarian cyst and fibroids pt has had        Relevant Orders   US Pelvic Complete With Transvaginal   POCT Urinalysis Dipstick (Automated)   Aortic atherosclerosis (HCC)    Check labs       Relevant Orders   CBC with Differential/Platelet   Comprehensive metabolic panel   Lipid panel  Assessment and Plan    Pelvic Pain New onset, localized to the left lower quadrant. History of a 2.4 cm left ovarian cyst in 2016, but no recent imaging. No associated urinary symptoms or abnormal vaginal discharge. -Order pelvic ultrasound to evaluate ovaries and uterus. -Collect urine sample to rule out urinary tract infection.  Possible Abdominal Hernia Patient reports a protrusion above the umbilicus, possibly related to a previous lipoma removal. No current pain or discomfort. -Refer to a surgeon for evaluation of possible hernia.  Medication Management Patient has not been taking levothyroxine for a couple of years and stopped spironolactone due to muscle cramps. Currently taking Lasix for occasional ankle swelling. -Order blood work to assess current health status and need for medication adjustments.  Weight Management Patient has previously discussed interest in weight loss medication, specifically Zepbound. -Advise patient to check with [facility] regarding coverage for weight loss medication or  for comorbidities.        No follow-ups on file.    Donato Schultz, DO

## 2022-11-02 NOTE — Assessment & Plan Note (Signed)
Check labs and ua US pelvis-- to check on ovarian cyst and fibroids pt has had

## 2022-11-02 NOTE — Assessment & Plan Note (Signed)
Check labs 

## 2022-11-05 ENCOUNTER — Ambulatory Visit (HOSPITAL_BASED_OUTPATIENT_CLINIC_OR_DEPARTMENT_OTHER)
Admission: RE | Admit: 2022-11-05 | Discharge: 2022-11-05 | Disposition: A | Discharge status: Home / Self Care | Payer: Managed Care, Other (non HMO) | Source: Ambulatory Visit | Attending: Family Medicine | Admitting: Family Medicine

## 2022-11-05 DIAGNOSIS — R102 Pelvic and perineal pain: Secondary | ICD-10-CM | POA: Insufficient documentation

## 2022-11-06 ENCOUNTER — Other Ambulatory Visit: Payer: Self-pay | Admitting: Family Medicine

## 2022-11-06 ENCOUNTER — Ambulatory Visit: Payer: Managed Care, Other (non HMO) | Admitting: Family Medicine

## 2022-11-06 DIAGNOSIS — N858 Other specified noninflammatory disorders of uterus: Secondary | ICD-10-CM

## 2022-11-07 ENCOUNTER — Other Ambulatory Visit: Payer: Self-pay

## 2022-11-07 MED ORDER — ROSUVASTATIN CALCIUM 10 MG PO TABS: 10.0000 mg | ORAL_TABLET | Freq: Every day | ORAL | 2 refills | Status: DC

## 2022-11-08 ENCOUNTER — Encounter (INDEPENDENT_AMBULATORY_CARE_PROVIDER_SITE_OTHER): Payer: Self-pay

## 2022-11-12 ENCOUNTER — Other Ambulatory Visit: Payer: Self-pay | Admitting: Obstetrics and Gynecology

## 2022-11-12 DIAGNOSIS — D259 Leiomyoma of uterus, unspecified: Secondary | ICD-10-CM

## 2022-11-18 ENCOUNTER — Ambulatory Visit (HOSPITAL_BASED_OUTPATIENT_CLINIC_OR_DEPARTMENT_OTHER): Payer: Managed Care, Other (non HMO)

## 2022-12-04 ENCOUNTER — Encounter: Payer: Managed Care, Other (non HMO) | Admitting: Family Medicine

## 2022-12-28 ENCOUNTER — Other Ambulatory Visit (HOSPITAL_BASED_OUTPATIENT_CLINIC_OR_DEPARTMENT_OTHER): Payer: Self-pay

## 2022-12-31 ENCOUNTER — Other Ambulatory Visit (HOSPITAL_BASED_OUTPATIENT_CLINIC_OR_DEPARTMENT_OTHER): Payer: Self-pay

## 2023-01-01 ENCOUNTER — Other Ambulatory Visit (HOSPITAL_BASED_OUTPATIENT_CLINIC_OR_DEPARTMENT_OTHER): Payer: Self-pay

## 2023-01-03 ENCOUNTER — Encounter: Payer: Self-pay | Admitting: Family Medicine

## 2023-01-03 ENCOUNTER — Ambulatory Visit (INDEPENDENT_AMBULATORY_CARE_PROVIDER_SITE_OTHER): Payer: Managed Care, Other (non HMO) | Admitting: Family Medicine

## 2023-01-03 ENCOUNTER — Other Ambulatory Visit (HOSPITAL_BASED_OUTPATIENT_CLINIC_OR_DEPARTMENT_OTHER): Payer: Self-pay

## 2023-01-03 VITALS — BP 128/80 | HR 83 | Temp 97.8°F | Resp 18 | Ht 64.0 in | Wt 281.0 lb

## 2023-01-03 DIAGNOSIS — E039 Hypothyroidism, unspecified: Secondary | ICD-10-CM | POA: Diagnosis not present

## 2023-01-03 DIAGNOSIS — K439 Ventral hernia without obstruction or gangrene: Secondary | ICD-10-CM

## 2023-01-03 DIAGNOSIS — E782 Mixed hyperlipidemia: Secondary | ICD-10-CM

## 2023-01-03 DIAGNOSIS — R600 Localized edema: Secondary | ICD-10-CM

## 2023-01-03 DIAGNOSIS — E66813 Obesity, class 3: Secondary | ICD-10-CM

## 2023-01-03 DIAGNOSIS — I10 Essential (primary) hypertension: Secondary | ICD-10-CM

## 2023-01-03 DIAGNOSIS — I700 Atherosclerosis of aorta: Secondary | ICD-10-CM | POA: Diagnosis not present

## 2023-01-03 DIAGNOSIS — Z0000 Encounter for general adult medical examination without abnormal findings: Secondary | ICD-10-CM

## 2023-01-03 DIAGNOSIS — Z6841 Body Mass Index (BMI) 40.0 and over, adult: Secondary | ICD-10-CM

## 2023-01-03 DIAGNOSIS — M1990 Unspecified osteoarthritis, unspecified site: Secondary | ICD-10-CM

## 2023-01-03 MED ORDER — FUROSEMIDE 40 MG PO TABS
40.0000 mg | ORAL_TABLET | Freq: Every day | ORAL | 3 refills | Status: AC
Start: 2023-01-03 — End: ?

## 2023-01-03 MED ORDER — TRAMADOL HCL 50 MG PO TABS: ORAL_TABLET | ORAL | 0 refills | Status: DC

## 2023-01-03 MED ORDER — ROSUVASTATIN CALCIUM 10 MG PO TABS
10.0000 mg | ORAL_TABLET | Freq: Every day | ORAL | 3 refills | Status: AC
Start: 2023-01-03 — End: ?

## 2023-01-03 NOTE — Assessment & Plan Note (Signed)
Pt to have a repair soon

## 2023-01-03 NOTE — Assessment & Plan Note (Signed)
We will consider zepbound after she recovers from her hysterectomy

## 2023-01-03 NOTE — Assessment & Plan Note (Signed)
Check thyroid   

## 2023-01-03 NOTE — Progress Notes (Signed)
Established Patient Office Visit  Subjective   Patient ID: Kelly [name], female    DOB: [birth year]  Age: 57 y.o. MRN: [id number]  Chief Complaint  Patient presents with   Annual Exam    Pt states fasting     HPI Discussed the use of AI scribe software for clinical note transcription with the patient, who gave verbal consent to proceed.  History of Present Illness   The patient, with a history of hyperlipidemia and obesity, presents for medication refills and to discuss upcoming surgeries. She is unsure if she has refills remaining for simvastatin, tramadol, and furosemide. She takes furosemide as needed, usually twice a week.  The patient is also planning for multiple surgeries. She has a large uterine fibroid, measuring approximately 10.8 cm by 9.6 cm by 11.8 cm, and a hernia. She has consulted with two surgeons and has an appointment with a third, a general surgeon, next week. The patient is considering having a complete hysterectomy, hernia repair, and removal of a lap band during the same surgical session. She is trying to schedule the surgeries for early to mid-November, to allow for recovery time during the holidays.  The patient also expresses interest in starting on Zepbound for weight loss, but is considering waiting until after the surgeries. She has confirmed that her insurance will cover the medication.     The patient, with a history of hyperlipidemia and obesity, presents for medication refills and to discuss upcoming surgeries. She is unsure if she has refills remaining for simvastatin, tramadol, and furosemide. She takes furosemide as needed, usually twice a week.  The patient is also planning for multiple surgeries. She has a large uterine fibroid, measuring approximately 10.8 cm by 9.6 cm by 11.8 cm, and a hernia. She has consulted with two surgeons and has an appointment with a third, a general surgeon, next week. The patient is considering having a complete  hysterectomy, hernia repair, and removal of a lap band during the same surgical session. She is trying to schedule the surgeries for early to mid-November, to allow for recovery time during the holidays.  The patient also expresses interest in starting on Zepbound for weight loss, but is considering waiting until after the surgeries. She has confirmed that her insurance will cover the medication.      Patient Active Problem List   Diagnosis Date Noted   Pelvic pain 11/02/2022   Ventral hernia without obstruction or gangrene 11/02/2022   Aortic atherosclerosis (HCC) 11/02/2022   Genital herpes simplex 02/11/2020   Arthritis 02/11/2020   Goiter 02/11/2020   SOB (shortness of breath) 08/14/2019   Non-cardiac chest pain 07/18/2018   Bilateral leg edema 12/17/2017   Hypothyroidism 10/29/2017   Insomnia 10/29/2017   Hives 01/13/2017   Obesity, Class III, BMI 40-49.9 (morbid obesity) (HCC) 01/06/2016   Intussusception of colon (HCC) 03/25/2015   Gastrointestinal multiple polyposis syndrome 03/22/2015   Preventative health care 01/08/2015   Palpitations 05/18/2014   Knee pain 10/09/2013   Popliteal pain 10/09/2013   Cellulitis and abscess of buttock 02/15/2013   LAP-BAND surgery status 07/01/2012   Onychomycosis 08/15/2011   Lapband APS Feb 2011 06/01/2011   Depression 01/15/2011   CONTACT DERMATITIS 09/13/2009   NECK AND BACK PAIN 04/14/2009   Hypertrophic and atrophic condition of skin 12/21/2008   Arthropathy 08/03/2008   Pain in joint, lower leg 09/17/2007   Essential hypertension 06/17/2007   EDEMA 06/17/2007   Anxiety state 08/08/2006   Allergic rhinitis 08/08/2006  Past Medical History:  Diagnosis Date   Anxiety    Arthritis    B12 deficiency    Breast cyst    Depression with anxiety    Edema    Heart murmur    Herpes    History of hiatal hernia    repaur 2011    Hyperlipidemia    flax seed no statins    Hypothyroidism    Joint pain    Menopause    Morbid  obesity (HCC) 2009   Ovarian cyst    PAC (premature atrial contraction)    Pedunculated colonic polyp 03/04/15   tubulovillous adenoma of transverse colon.    Pneumonia    hx of as a child    PONV (postoperative nausea and vomiting)    PVC (premature ventricular contraction)    Thyroid goiter    Uterine fibroid    Vitamin D deficiency    Past Surgical History:  Procedure Laterality Date   COLON SURGERY     lipoma removed from intestine    COLONOSCOPY     LAPAROSCOPIC GASTRIC BANDING  05-23-09   dr Odie Sera with hiatal hernia repair.    LAPAROSCOPIC RIGHT HEMI COLECTOMY Right 03/25/2015   Procedure: LAPAROSCOPIC ASSISTED RIGHT HEMI COLECTOMY, with removal of lap band fluid prior to prepping.;  Surgeon: Ovidio Kin, MD;  Location: [facility];  Service: General;  Laterality: Right;   LIPOSUCTION  1995   POLYPECTOMY     Social History   Tobacco Use   Smoking status: Never   Smokeless tobacco: Never  Vaping Use   Vaping status: Never Used  Substance Use Topics   Alcohol use: Yes    Alcohol/week: 3.0 standard drinks of alcohol    Types: 3 Glasses of wine per week    Comment: socially   Drug use: No   Social History   Socioeconomic History   Marital status: Single    Spouse name: Not on file   Number of children: 0   Years of education: Not on file   Highest education level: Not on file  Occupational History   Occupation: CMA    Comment: CCM    Tobacco Use   Smoking status: Never   Smokeless tobacco: Never  Vaping Use   Vaping status: Never Used  Substance and Sexual Activity   Alcohol use: Yes    Alcohol/week: 3.0 standard drinks of alcohol    Types: 3 Glasses of wine per week    Comment: socially   Drug use: No   Sexual activity: Never  Other Topics Concern   Not on file  Social History Narrative   Not on file   Social Determinants of Health   Financial Resource Strain: Not on file  Food Insecurity: Not on file  Transportation Needs: Not on file   Physical Activity: Inactive (05/31/2017)   Exercise Vital Sign    Days of Exercise per Week: 0 days    Minutes of Exercise per Session: 0 min  Stress: Not on file  Social Connections: Unknown (11/16/2022)   Received [facility]   Social Network    Social Network: Not on file  Intimate Partner Violence: Unknown (11/16/2022)   Received from [facility]   HITS    Physically Hurt: Not on file    Insult or Talk Down To: Not on file    Threaten Physical Harm: Not on file    Scream or Curse: Not on file   Family Status  Relation Name Status  Mother  Alive   Father  Alive   MGM  Alive   PGM  Deceased at age 53       MI   Pat Aunt  Alive   MGF  Deceased   PGF  Deceased at age 30       dementia   Neg Hx  (Not Specified)  No partnership data on file   Family History  Problem Relation Age of Onset   Hypertension Mother    Aneurysm Mother 43       brain   Hyperlipidemia Mother    Stroke Mother    Thyroid disease Mother    Hypertension Father    Hyperlipidemia Father    Obesity Father    Colon polyps Father    Hypertension Maternal Grandmother    Hypertension Paternal Grandmother    Heart disease Paternal Grandmother 31       MI   Diabetes Paternal Aunt    Heart disease Maternal Grandfather        MI   Alzheimer's disease Paternal Grandfather    Colon cancer Neg Hx    Esophageal cancer Neg Hx    Rectal cancer Neg Hx    Stomach cancer Neg Hx    No Known Allergies    ROS    Objective:     BP 128/80 (BP Location: Left Arm, Patient Position: Sitting, Cuff Size: Large)   Pulse 83   Temp 97.8 F (36.6 C) (Oral)   Resp 18   Ht 5\' 4"  (1.626 m)   Wt 281 lb (127.5 kg)   LMP 03/21/2015   SpO2 98%   BMI 48.23 kg/m  BP Readings from Last 3 Encounters:  01/03/23 128/80  11/02/22 (!) 172/66  03/23/20 116/70   Wt Readings from Last 3 Encounters:  01/03/23 281 lb (127.5 kg)  11/02/22 289 lb (131.1 kg)  03/23/20 274 lb (124.3 kg)   SpO2 Readings from Last  3 Encounters:  01/03/23 98%  11/02/22 99%  03/23/20 100%      Physical Exam   No results found for any visits on 01/03/23.  Last CBC Lab Results  Component Value Date   WBC 9.5 11/02/2022   HGB 13.3 11/02/2022   HCT 40.1 11/02/2022   MCV 91.6 11/02/2022   MCH 30.4 11/02/2022   RDW 12.3 11/02/2022   PLT 356 11/02/2022   Last metabolic panel Lab Results  Component Value Date   GLUCOSE 82 11/02/2022   NA 139 11/02/2022   K 3.9 11/02/2022   CL 103 11/02/2022   CO2 26 11/02/2022   BUN 13 11/02/2022   CREATININE 1.08 (H) 11/02/2022   GFR 75.83 06/16/2019   CALCIUM 9.4 11/02/2022   PROT 7.6 11/02/2022   ALBUMIN 3.8 06/16/2019   BILITOT 1.1 11/02/2022   ALKPHOS 86 06/16/2019   AST 11 11/02/2022   ALT 7 11/02/2022   ANIONGAP 8 03/26/2015   Last lipids Lab Results  Component Value Date   CHOL 200 (H) 11/02/2022   HDL 58 11/02/2022   LDLCALC 123 (H) 11/02/2022   TRIG 91 11/02/2022   CHOLHDL 3.4 11/02/2022   Last hemoglobin A1c Lab Results  Component Value Date   HGBA1C 5.3 01/09/2018   Last thyroid functions Lab Results  Component Value Date   TSH 3.30 11/02/2022   T4TOTAL 9.5 06/16/2019   Last vitamin D Lab Results  Component Value Date   VD25OH 23.80 (L) 06/16/2019   Last vitamin B12 and Folate Lab Results  Component  Value Date   VITAMINB12 139 (L) 06/16/2019   FOLATE 6.3 01/09/2018      The 10-year ASCVD risk score (Arnett DK, et al., 2019) is: 5.8%    Assessment & Plan:   Problem List Items Addressed This Visit       Unprioritized   Bilateral leg edema   Relevant Medications   furosemide (LASIX) 40 MG tablet   Arthritis   Relevant Medications   traMADol (ULTRAM) 50 MG tablet   Aortic atherosclerosis (HCC)   Relevant Medications   rosuvastatin (CRESTOR) 10 MG tablet   furosemide (LASIX) 40 MG tablet   Ventral hernia without obstruction or gangrene    Pt to have a repair soon       Preventative health care - Primary    Ghm  utd Check labs  Health Maintenance  Topic Date Due   Cervical Cancer Screening (HPV/Pap Cotest)  02/12/2020   COVID-19 Vaccine (2 - 2023-24 season) 11/25/2022   INFLUENZA VACCINE  06/24/2023 (Originally 10/25/2022)   MAMMOGRAM  07/04/2023 (Originally 04/28/2020)   Colonoscopy  03/23/2025   DTaP/Tdap/Td (4 - Td or Tdap) 06/01/2027   Hepatitis C Screening  Completed   HIV Screening  Completed   Zoster Vaccines- Shingrix  Completed   HPV VACCINES  Aged Out         Relevant Orders   CBC with Differential/Platelet   Comprehensive metabolic panel   Lipid panel   Obesity, Class III, BMI 40-49.9 (morbid obesity) (HCC)    We will consider zepbound after she recovers from her hysterectomy      Hypothyroidism    Check thyroid       Relevant Orders   TSH   Essential hypertension    Well controlled, no changes to meds. Encouraged heart healthy diet such as the DASH diet and exercise as tolerated.        Relevant Medications   rosuvastatin (CRESTOR) 10 MG tablet   furosemide (LASIX) 40 MG tablet   Other Visit Diagnoses     Mixed hyperlipidemia       Relevant Medications   rosuvastatin (CRESTOR) 10 MG tablet   furosemide (LASIX) 40 MG tablet   Other Relevant Orders   Comprehensive metabolic panel   Lipid panel   Class 3 severe obesity due to excess calories with serious comorbidity and body mass index (BMI) of 40.0 to 44.9 in adult [facility])       Relevant Orders   Insulin, random     Assessment and Plan    Uterine Fibroid Large fibroid identified on MRI, causing discomfort. Discussed with gynecological oncologist and general surgeon. Plan for total hysterectomy and concurrent hernia repair. -Continue current management and follow up with surgeons.  Medication Refills Request for refills on Simvastatin, Furosemide, and Tramadol. Furosemide used as needed, approximately twice a week. -Refill Simvastatin, Furosemide, and Tramadol prescriptions.  Weight Management Expressed  interest in starting Zepbound for weight loss after recovery from upcoming surgery. -Plan to initiate Zepbound after recovery from surgery.  General Health Maintenance Upcoming flu shot and mammogram. -Encourage patient to receive flu shot and mammogram as planned.        No follow-ups on file.    Donato Schultz, DO

## 2023-01-03 NOTE — Assessment & Plan Note (Signed)
Well controlled, no changes to meds. Encouraged heart healthy diet such as the DASH diet and exercise as tolerated.  °

## 2023-01-03 NOTE — Assessment & Plan Note (Signed)
Ghm utd Check labs  Health Maintenance  Topic Date Due   Cervical Cancer Screening (HPV/Pap Cotest)  02/12/2020   COVID-19 Vaccine (2 - 2023-24 season) 11/25/2022   INFLUENZA VACCINE  06/24/2023 (Originally 10/25/2022)   MAMMOGRAM  07/04/2023 (Originally 04/28/2020)   Colonoscopy  03/23/2025   DTaP/Tdap/Td (4 - Td or Tdap) 06/01/2027   Hepatitis C Screening  Completed   HIV Screening  Completed   Zoster Vaccines- Shingrix  Completed   HPV VACCINES  Aged Out

## 2023-01-04 ENCOUNTER — Other Ambulatory Visit (HOSPITAL_BASED_OUTPATIENT_CLINIC_OR_DEPARTMENT_OTHER): Payer: Self-pay

## 2023-01-04 LAB — CBC WITH DIFFERENTIAL/PLATELET
Basophils Absolute: 0.1 10*3/uL (ref 0.0–0.1)
Basophils Relative: 1.1 % (ref 0.0–3.0)
Eosinophils Absolute: 0.3 10*3/uL (ref 0.0–0.7)
Eosinophils Relative: 3.3 % (ref 0.0–5.0)
HCT: 43 % (ref 36.0–46.0)
Hemoglobin: 14 g/dL (ref 12.0–15.0)
Lymphocytes Relative: 31.8 % (ref 12.0–46.0)
Lymphs Abs: 2.8 10*3/uL (ref 0.7–4.0)
MCHC: 32.7 g/dL (ref 30.0–36.0)
MCV: 92 fL (ref 78.0–100.0)
Monocytes Absolute: 0.3 10*3/uL (ref 0.1–1.0)
Monocytes Relative: 4 % (ref 3.0–12.0)
Neutro Abs: 5.2 10*3/uL (ref 1.4–7.7)
Neutrophils Relative %: 59.8 % (ref 43.0–77.0)
Platelets: 342 10*3/uL (ref 150.0–400.0)
RBC: 4.67 Mil/uL (ref 3.87–5.11)
RDW: 12.9 % (ref 11.5–15.5)
WBC: 8.7 10*3/uL (ref 4.0–10.5)

## 2023-01-04 LAB — LIPID PANEL
Cholesterol: 126 mg/dL (ref 0–200)
HDL: 55.9 mg/dL (ref >39.00)
LDL Cholesterol: 53 mg/dL (ref 0–99)
NonHDL: 69.65
Total CHOL/HDL Ratio: 2
Triglycerides: 85 mg/dL (ref 0.0–149.0)
VLDL: 17 mg/dL (ref 0.0–40.0)

## 2023-01-04 LAB — TSH: TSH: 3.5 u[IU]/mL (ref 0.35–5.50)

## 2023-01-04 LAB — COMPREHENSIVE METABOLIC PANEL
ALT: 8 U/L (ref 0–35)
AST: 12 U/L (ref 0–37)
Albumin: 4.1 g/dL (ref 3.5–5.2)
Alkaline Phosphatase: 100 U/L (ref 39–117)
BUN: 8 mg/dL (ref 6–23)
CO2: 28 meq/L (ref 19–32)
Calcium: 9.8 mg/dL (ref 8.4–10.5)
Chloride: 102 meq/L (ref 96–112)
Creatinine, Ser: 0.93 mg/dL (ref 0.40–1.20)
GFR: 67.9 mL/min (ref >60.00)
Glucose, Bld: 79 mg/dL (ref 70–99)
Potassium: 4.2 meq/L (ref 3.5–5.1)
Sodium: 139 meq/L (ref 135–145)
Total Bilirubin: 1.5 mg/dL — ABNORMAL HIGH (ref 0.2–1.2)
Total Protein: 7.3 g/dL (ref 6.0–8.3)

## 2023-01-04 LAB — INSULIN, RANDOM: Insulin: 9.8 u[IU]/mL

## 2023-01-09 ENCOUNTER — Other Ambulatory Visit (HOSPITAL_BASED_OUTPATIENT_CLINIC_OR_DEPARTMENT_OTHER): Payer: Self-pay

## 2023-01-11 ENCOUNTER — Other Ambulatory Visit (HOSPITAL_BASED_OUTPATIENT_CLINIC_OR_DEPARTMENT_OTHER): Payer: Self-pay

## 2023-01-14 ENCOUNTER — Other Ambulatory Visit (HOSPITAL_BASED_OUTPATIENT_CLINIC_OR_DEPARTMENT_OTHER): Payer: Self-pay

## 2023-01-17 ENCOUNTER — Other Ambulatory Visit (HOSPITAL_BASED_OUTPATIENT_CLINIC_OR_DEPARTMENT_OTHER): Payer: Self-pay

## 2023-01-17 ENCOUNTER — Telehealth: Payer: Self-pay | Admitting: Pharmacy Technician

## 2023-01-17 ENCOUNTER — Other Ambulatory Visit (HOSPITAL_COMMUNITY): Payer: Self-pay

## 2023-01-17 NOTE — Telephone Encounter (Signed)
Pharmacy Patient Advocate Encounter   Received notification from CoverMyMeds that prior authorization for traMADol HCl 50MG  tablets is required/requested.   Insurance verification completed.   The patient is insured through [facility] [place] .   Per test claim: PA required; PA submitted to [facility] [place] Medicaid via CoverMyMeds Key/confirmation #/EOC [facility] Status is pending

## 2023-01-18 ENCOUNTER — Other Ambulatory Visit (HOSPITAL_COMMUNITY): Payer: Self-pay

## 2023-01-18 ENCOUNTER — Other Ambulatory Visit (HOSPITAL_BASED_OUTPATIENT_CLINIC_OR_DEPARTMENT_OTHER): Payer: Self-pay

## 2023-01-18 NOTE — Telephone Encounter (Signed)
Pharmacy Patient Advocate Encounter  Received notification from [facility] that Prior Authorization for traMADol HCl 50MG  tablets has been APPROVED from 01/17/2023 to 07/16/2023. Ran test claim, Copay is $4.00. This test claim was processed through [facility]- copay amounts may vary at other pharmacies due to pharmacy/plan contracts, or as the patient moves through the different stages of their insurance plan.   PA #/Case ID/Reference #: [id number]  Pharmacy contacted.

## 2023-01-21 ENCOUNTER — Other Ambulatory Visit (HOSPITAL_BASED_OUTPATIENT_CLINIC_OR_DEPARTMENT_OTHER): Payer: Self-pay

## 2023-01-22 ENCOUNTER — Other Ambulatory Visit (HOSPITAL_BASED_OUTPATIENT_CLINIC_OR_DEPARTMENT_OTHER): Payer: Self-pay

## 2023-01-24 ENCOUNTER — Other Ambulatory Visit (HOSPITAL_BASED_OUTPATIENT_CLINIC_OR_DEPARTMENT_OTHER): Payer: Self-pay

## 2023-01-29 ENCOUNTER — Other Ambulatory Visit (HOSPITAL_BASED_OUTPATIENT_CLINIC_OR_DEPARTMENT_OTHER): Payer: Self-pay

## 2023-02-04 ENCOUNTER — Other Ambulatory Visit (HOSPITAL_BASED_OUTPATIENT_CLINIC_OR_DEPARTMENT_OTHER): Payer: Self-pay

## 2023-02-06 ENCOUNTER — Other Ambulatory Visit (HOSPITAL_BASED_OUTPATIENT_CLINIC_OR_DEPARTMENT_OTHER): Payer: Self-pay

## 2023-02-07 ENCOUNTER — Other Ambulatory Visit (HOSPITAL_BASED_OUTPATIENT_CLINIC_OR_DEPARTMENT_OTHER): Payer: Self-pay

## 2023-06-25 ENCOUNTER — Encounter: Payer: Self-pay | Admitting: Family Medicine

## 2023-06-25 ENCOUNTER — Ambulatory Visit (INDEPENDENT_AMBULATORY_CARE_PROVIDER_SITE_OTHER): Admitting: Family Medicine

## 2023-06-25 VITALS — BP 154/64 | HR 60 | Resp 18 | Ht 64.0 in | Wt 282.0 lb

## 2023-06-25 DIAGNOSIS — F419 Anxiety disorder, unspecified: Secondary | ICD-10-CM

## 2023-06-25 DIAGNOSIS — L819 Disorder of pigmentation, unspecified: Secondary | ICD-10-CM | POA: Diagnosis not present

## 2023-06-25 DIAGNOSIS — Z79899 Other long term (current) drug therapy: Secondary | ICD-10-CM

## 2023-06-25 DIAGNOSIS — R002 Palpitations: Secondary | ICD-10-CM

## 2023-06-25 DIAGNOSIS — R000 Tachycardia, unspecified: Secondary | ICD-10-CM

## 2023-06-25 DIAGNOSIS — E66813 Obesity, class 3: Secondary | ICD-10-CM | POA: Diagnosis not present

## 2023-06-25 DIAGNOSIS — M1990 Unspecified osteoarthritis, unspecified site: Secondary | ICD-10-CM

## 2023-06-25 DIAGNOSIS — Z6841 Body Mass Index (BMI) 40.0 and over, adult: Secondary | ICD-10-CM

## 2023-06-25 MED ORDER — TRAMADOL HCL 50 MG PO TABS: ORAL_TABLET | ORAL | 0 refills | Status: DC

## 2023-06-25 MED ORDER — ALPRAZOLAM 0.25 MG PO TABS: 0.2500 mg | ORAL_TABLET | Freq: Three times a day (TID) | ORAL | 0 refills | Status: DC | PRN

## 2023-06-25 MED ORDER — ZEPBOUND 2.5 MG/0.5ML ~~LOC~~ SOAJ: 2.5000 mg | SUBCUTANEOUS | 0 refills | Status: AC

## 2023-06-25 NOTE — Patient Instructions (Signed)
 Obesity, Adult Obesity is the condition of having too much total body fat. Being overweight or obese means that your weight is greater than what is considered healthy for your body size. Obesity is determined by a measurement called BMI (body mass index). BMI is an estimate of body fat and is calculated from height and weight. For adults, a BMI of 30 or higher is considered obese. Obesity can lead to other health concerns and major illnesses, including: Stroke. Coronary artery disease (CAD). Type 2 diabetes. Some types of cancer, including cancers of the colon, breast, uterus, and gallbladder. High blood pressure (hypertension). High cholesterol. Gallbladder stones. Obesity can also contribute to: Osteoarthritis. Sleep apnea. Infertility problems. What are the causes? Common causes of this condition include: Eating daily meals that are high in calories, sugar, and fat. Drinking high amounts of sugar-sweetened beverages, such as soft drinks. Being born with genes that may make you more likely to become obese. Having a medical condition that causes obesity, including: Hypothyroidism. Polycystic ovarian syndrome (PCOS). Binge-eating disorder. Cushing syndrome. Taking certain medicines, such as steroids, antidepressants, and seizure medicines. Not being physically active (sedentary lifestyle). Not getting enough sleep. What increases the risk? The following factors may make you more likely to develop this condition: Having a family history of obesity. Living in an area with limited access to: [place], recreation centers, or sidewalks. Healthy food choices, such as grocery stores and farmers' markets. What are the signs or symptoms? The main sign of this condition is having too much body fat. How is this diagnosed? This condition is diagnosed based on: Your BMI. If you are an adult with a BMI of 30 or higher, you are considered obese. Your waist circumference. This measures the  distance around your waistline. Your skinfold thickness. Your health care provider may gently pinch a fold of your skin and measure it. You may have other tests to check for underlying conditions. How is this treated? Treatment for this condition often includes changing your lifestyle. Treatment may include some or all of the following: Dietary changes. This may include developing a healthy meal plan. Regular physical activity. This may include activity that causes your heart to beat faster (aerobic exercise) and strength training. Work with your health care provider to design an exercise program that works for you. Medicine to help you lose weight if you are unable to lose one pound a week after six weeks of healthy eating and more physical activity. Treating conditions that cause the obesity (underlying conditions). Surgery. Surgical options may include gastric banding and gastric bypass. Surgery may be done if: Other treatments have not helped to improve your condition. You have a BMI of 40 or higher. You have life-threatening health problems related to obesity. Follow these instructions at home: Eating and drinking  Follow recommendations from your health care provider about what you eat and drink. Your health care provider may advise you to: Limit fast food, sweets, and processed snack foods. Choose low-fat options, such as low-fat milk instead of whole milk. Eat five or more servings of fruits or vegetables every day. Choose healthy foods when you eat out. Keep low-fat snacks available. Limit sugary drinks, such as soda, fruit juice, sweetened iced tea, and flavored milk. Drink enough water to keep your urine pale yellow. Do not follow a fad diet. Fad diets can be unhealthy and even dangerous. Other healthful choices include: Eat at home more often. This gives you more control over what you eat. Learn to read food labels.  This will help you understand how much food is considered one  serving. Learn what a healthy serving size is. Physical activity Exercise regularly, as told by your health care provider. Most adults should get up to 150 minutes of moderate-intensity exercise every week. Ask your health care provider what types of exercise are safe for you and how often you should exercise. Warm up and stretch before being active. Cool down and stretch after being active. Rest between periods of activity. Lifestyle Work with your health care provider and a dietitian to set a weight-loss goal that is healthy and reasonable for you. Limit your screen time. Find ways to reward yourself that do not involve food. Do not drink alcohol if: Your health care provider tells you not to drink. You are pregnant, may be pregnant, or are planning to become pregnant. If you drink alcohol: Limit how much you have to: 0-1 drink a day for women. 0-2 drinks a day for men. Know how much alcohol is in your drink. In the [place] one drink equals one 12 oz bottle of beer (355 mL), one 5 oz glass of wine (148 mL), or one 1 oz glass of hard liquor (44 mL). General instructions Keep a weight-loss journal to keep track of the food you eat and how much exercise you get. Take over-the-counter and prescription medicines only as told by your health care provider. Take vitamins and supplements only as told by your health care provider. Consider joining a support group. Your health care provider may be able to recommend a support group. Pay attention to your mental health as obesity can lead to depression or self esteem issues. Keep all follow-up visits. This is important. Contact a health care provider if: You are unable to meet your weight-loss goal after six weeks of dietary and lifestyle changes. You have trouble breathing. Summary Obesity is the condition of having too much total body fat. Being overweight or obese means that your weight is greater than what is considered healthy for your body  size. Work with your health care provider and a dietitian to set a weight-loss goal that is healthy and reasonable for you. Exercise regularly, as told by your health care provider. Ask your health care provider what types of exercise are safe for you and how often you should exercise. This information is not intended to replace advice given to you by your health care provider. Make sure you discuss any questions you have with your health care provider. Document Revised: 10/18/2020 Document Reviewed: 10/18/2020 Elsevier Patient Education  2024 [employer].

## 2023-06-25 NOTE — Progress Notes (Signed)
 Established Patient Office Visit  Subjective   Patient ID: Kelly [name], female    DOB: June 01, 1965  Age: 58 y.o. MRN: [id number]  Chief Complaint  Patient presents with   skin check    Left leg , onset: gradual    Weight Loss    HPI Discussed the use of AI scribe software for clinical note transcription with the patient, who gave verbal consent to proceed.  History of Present Illness Kelly [name] is a 58 year old female who presents for a discussion about Zepbound and evaluation of a leg spot.  She is considering starting Zepbound for weight management. She has not yet begun the medication and is coordinating with her insurance for coverage. She has a history of a lap band that was removed due to ineffectiveness and body rejection. She views medication as her last effort outside of lifestyle modifications.  She has a spot on her leg that has been present for about a year. The spot is non-painful, non-itchy, and non-raised. She first noticed it without any associated symptoms. She was previously referred to dermatology but was unable to attend the appointment due to starting a new job.  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 The mass was 10 cm in size. She has had follow-up CT scans and is being monitored for recurrence. No chemotherapy has been required at this time. During the surgery, a lap band and a hernia were also removed. The lap band had been in place since 2011 but was not effective. In August of the previous year, an ultrasound was performed which initially identified a fibroid. She experienced some pain, which prompted further investigation and led to the eventual diagnosis and surgery. She had been informed about a fibroid as early as 2016, but it was monitored without intervention until symptoms prompted further evaluation.   Patient Active Problem List   Diagnosis Date Noted   Pelvic pain  11/02/2022   Ventral hernia without obstruction or gangrene 11/02/2022   Aortic atherosclerosis (HCC) 11/02/2022   Genital herpes simplex 02/11/2020   Arthritis 02/11/2020   Goiter 02/11/2020   SOB (shortness of breath) 08/14/2019   Non-cardiac chest pain 07/18/2018   Bilateral leg edema 12/17/2017   Hypothyroidism 10/29/2017   Insomnia 10/29/2017   Hives 01/13/2017   Obesity, Class III, BMI 40-49.9 (morbid obesity) (HCC) 01/06/2016   Intussusception of colon (HCC) 03/25/2015   Gastrointestinal multiple polyposis syndrome 03/22/2015   Preventative health care 01/08/2015   Palpitations 05/18/2014   Knee pain 10/09/2013   Popliteal pain 10/09/2013   Cellulitis and abscess of buttock 02/15/2013   LAP-BAND surgery status 07/01/2012   Onychomycosis 08/15/2011   Lapband APS Feb 2011 06/01/2011   Depression 01/15/2011   CONTACT DERMATITIS 09/13/2009   NECK AND BACK PAIN 04/14/2009   Hypertrophic and atrophic condition of skin 12/21/2008   Arthropathy 08/03/2008   Pain in joint, lower leg 09/17/2007   Essential hypertension 06/17/2007   EDEMA 06/17/2007   Anxiety state 08/08/2006   Allergic rhinitis 08/08/2006   Past Medical History:  Diagnosis Date   Anxiety    Arthritis    B12 deficiency    Breast cyst    Depression with anxiety    Edema    Heart murmur    Herpes    History of hiatal hernia    repaur 2011    Hyperlipidemia    flax seed no statins    Hypothyroidism  Joint pain    Menopause    Morbid obesity (HCC) 2009   Ovarian cyst    PAC (premature atrial contraction)    Pedunculated colonic polyp 03/04/15   tubulovillous adenoma of transverse colon.    Pneumonia    hx of as a child    PONV (postoperative nausea and vomiting)    PVC (premature ventricular contraction)    Thyroid goiter    Uterine fibroid    Vitamin D deficiency    Past Surgical History:  Procedure Laterality Date   COLON SURGERY     lipoma removed from intestine    COLONOSCOPY      LAPAROSCOPIC GASTRIC BANDING  05-23-09   dr Odie Sera with hiatal hernia repair.    LAPAROSCOPIC RIGHT HEMI COLECTOMY Right 03/25/2015   Procedure: LAPAROSCOPIC ASSISTED RIGHT HEMI COLECTOMY, with removal of lap band fluid prior to prepping.;  Surgeon: Ovidio Kin, MD;  Location: [facility];  Service: General;  Laterality: Right;   LIPOSUCTION  1995   POLYPECTOMY     Social History   Tobacco Use   Smoking status: Never   Smokeless tobacco: Never  Vaping Use   Vaping status: Never Used  Substance Use Topics   Alcohol use: Yes    Alcohol/week: 3.0 standard drinks of alcohol    Types: 3 Glasses of wine per week    Comment: socially   Drug use: No   Social History   Socioeconomic History   Marital status: Single    Spouse name: Not on file   Number of children: 0   Years of education: Not on file   Highest education level: Not on file  Occupational History   Occupation: CMA    Comment: C[employer]    Tobacco Use   Smoking status: Never   Smokeless tobacco: Never  Vaping Use   Vaping status: Never Used  Substance and Sexual Activity   Alcohol use: Yes    Alcohol/week: 3.0 standard drinks of alcohol    Types: 3 Glasses of wine per week    Comment: socially   Drug use: No   Sexual activity: Never  Other Topics Concern   Not on file  Social History Narrative   Not on file   Social Drivers of Health   Financial Resource Strain: Not on file  Food Insecurity: No Food Insecurity (02/07/2023)   Received from [facility]   Hunger Vital Sign    Worried About Running Out of Food in the Last Year: Never true    Ran Out of Food in the Last Year: Never true  Transportation Needs: No Transportation Needs (02/07/2023)   Received from [facility] - Transportation    Lack of Transportation (Medical): No    Lack of Transportation (Non-Medical): No  Physical Activity: Inactive (05/31/2017)   Exercise Vital Sign    Days of Exercise per Week: 0 days    Minutes of Exercise per  Session: 0 min  Stress: No Stress Concern Present (02/07/2023)   Received from [facility] - Occupational Stress Questionnaire    Feeling of Stress : Not at all  Social Connections: Unknown (11/16/2022)   Received from [facility]   Social Network    Social Network: Not on file  Intimate Partner Violence: Not At Risk (02/07/2023)   Received from [facility]   HITS    Over the last 12 months how often did your partner physically hurt you?: Never  Over the last 12 months how often did your partner insult you or talk down to you?: Never    Over the last 12 months how often did your partner threaten you with physical harm?: Never    Over the last 12 months how often did your partner scream or curse at you?: Never   Family Status  Relation Name Status   Mother  Alive   Father  Alive   MGM  Alive   PGM  Deceased at age 73       MI   Pat Aunt  Alive   MGF  Deceased   PGF  Deceased at age 75       dementia   Neg Hx  (Not Specified)  No partnership data on file   Family History  Problem Relation Age of Onset   Hypertension Mother    Aneurysm Mother 44       brain   Hyperlipidemia Mother    Stroke Mother    Thyroid disease Mother    Hypertension Father    Hyperlipidemia Father    Obesity Father    Colon polyps Father    Hypertension Maternal Grandmother    Hypertension Paternal Grandmother    Heart disease Paternal Grandmother 37       MI   Diabetes Paternal Aunt    Heart disease Maternal Grandfather        MI   Alzheimer's disease Paternal Grandfather    Colon cancer Neg Hx    Esophageal cancer Neg Hx    Rectal cancer Neg Hx    Stomach cancer Neg Hx    No Known Allergies    Review of Systems  Constitutional:  Negative for fever and malaise/fatigue.  HENT:  Negative for congestion.   Eyes:  Negative for blurred vision.  Respiratory:  Negative for cough and shortness of breath.   Cardiovascular:  Negative for chest  pain, palpitations and leg swelling.  Gastrointestinal:  Negative for abdominal pain, blood in stool, nausea and vomiting.  Genitourinary:  Negative for dysuria and frequency.  Musculoskeletal:  Negative for back pain and falls.  Skin:  Negative for rash.  Neurological:  Negative for dizziness, loss of consciousness and headaches.  Endo/Heme/Allergies:  Negative for environmental allergies.  Psychiatric/Behavioral:  Negative for depression. The patient is not nervous/anxious.       Objective:     BP (!) 154/64   Pulse 60   Resp 18   Ht 5\' 4"  (1.626 m)   Wt 282 lb (127.9 kg)   LMP 03/21/2015   SpO2 98%   BMI 48.41 kg/m  BP Readings from Last 3 Encounters:  06/25/23 (!) 154/64  01/03/23 128/80  11/02/22 (!) 172/66   Wt Readings from Last 3 Encounters:  06/25/23 282 lb (127.9 kg)  01/03/23 281 lb (127.5 kg)  11/02/22 289 lb (131.1 kg)   SpO2 Readings from Last 3 Encounters:  06/25/23 98%  01/03/23 98%  11/02/22 99%      Physical Exam Vitals and nursing note reviewed.  Constitutional:      General: She is not in acute distress.    Appearance: Normal appearance. She is well-developed.  HENT:     Head: Normocephalic and atraumatic.  Eyes:     General: No scleral icterus.       Right eye: No discharge.        Left eye: No discharge.  Cardiovascular:     Rate and Rhythm: Normal rate and regular rhythm.  Heart sounds: No murmur heard. Pulmonary:     Effort: Pulmonary effort is normal. No respiratory distress.     Breath sounds: Normal breath sounds.  Musculoskeletal:        General: Normal range of motion.     Cervical back: Normal range of motion and neck supple.     Right lower leg: No edema.     Left lower leg: No edema.  Skin:    General: Skin is warm and dry.  Neurological:     Mental Status: She is alert and oriented to person, place, and time.  Psychiatric:        Mood and Affect: Mood normal.        Behavior: Behavior normal.        Thought  Content: Thought content normal.        Judgment: Judgment normal.       Media Information  Document Information  Photos    06/25/2023 18:19  Attached To:  Office Visit on 06/25/23 with Donato Schultz, DO  Source Information  Donato Schultz, DO  [facility]  Document History    No results found for any visits on 06/25/23.  Last CBC Lab Results  Component Value Date   WBC 8.7 01/03/2023   HGB 14.0 01/03/2023   HCT 43.0 01/03/2023   MCV 92.0 01/03/2023   MCH 30.4 11/02/2022   RDW 12.9 01/03/2023   PLT 342.0 01/03/2023   Last metabolic panel Lab Results  Component Value Date   GLUCOSE 79 01/03/2023   NA 139 01/03/2023   K 4.2 01/03/2023   CL 102 01/03/2023   CO2 28 01/03/2023   BUN 8 01/03/2023   CREATININE 0.93 01/03/2023   GFR 67.90 01/03/2023   CALCIUM 9.8 01/03/2023   PROT 7.3 01/03/2023   ALBUMIN 4.1 01/03/2023   BILITOT 1.5 (H) 01/03/2023   ALKPHOS 100 01/03/2023   AST 12 01/03/2023   ALT 8 01/03/2023   ANIONGAP 8 03/26/2015   Last lipids Lab Results  Component Value Date   CHOL 126 01/03/2023   HDL 55.90 01/03/2023   LDLCALC 53 01/03/2023   TRIG 85.0 01/03/2023   CHOLHDL 2 01/03/2023   Last hemoglobin A1c Lab Results  Component Value Date   HGBA1C 5.3 01/09/2018   Last thyroid functions Lab Results  Component Value Date   TSH 3.50 01/03/2023   T4TOTAL 9.5 06/16/2019   Last vitamin D Lab Results  Component Value Date   VD25OH 23.80 (L) 06/16/2019   Last vitamin B12 and Folate Lab Results  Component Value Date   VITAMINB12 139 (L) 06/16/2019   FOLATE 6.3 01/09/2018      The ASCVD Risk score (Arnett DK, et al., 2019) failed to calculate for the following reasons:   The valid total cholesterol range is 130 to 320 mg/dL    Assessment & Plan:   Problem List Items Addressed This Visit       Unprioritized   Palpitations   Relevant Medications   ALPRAZolam (XANAX) 0.25 MG tablet   Arthritis   Relevant  Medications   traMADol (ULTRAM) 50 MG tablet   Other Visit Diagnoses       Hyperpigmentation    -  Primary   Relevant Orders   Ambulatory referral to Dermatology     Class 3 severe obesity due to excess calories with serious comorbidity and body mass index (BMI) of 40.0 to 44.9 in adult [facility])       Relevant Medications  tirzepatide (ZEPBOUND) 2.5 MG/0.5ML Pen     Tachycardia       Relevant Medications   ALPRAZolam (XANAX) 0.25 MG tablet     Mild anxiety       Relevant Medications   ALPRAZolam (XANAX) 0.25 MG tablet   Other Relevant Orders   Drug Monitoring Panel [phone] , Urine     High risk medication use       Relevant Orders   Drug Monitoring Panel [phone] , Urine     Assessment and Plan Assessment & Plan Endometrial sarcoma   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 Surgery was performed in November 2024 at [facility]. She is currently under surveillance for recurrence, with no chemotherapy or additional treatment needed. Continue regular follow-up appointments and CT scans as necessary.  Weight management   She plans to start Zepbound for weight management, with insurance coverage confirmed. A lap band was previously removed due to ineffectiveness and body rejection. Initiate Zepbound with a gradual monthly dosage increase based on weight loss progress. Emphasize adequate protein intake and regular exercise, such as walking, to enhance weight loss. If Zepbound is not covered, consider [employer] as secondary insurance.  Skin lesion on leg   A non-painful, non-itchy, non-raised yellow spot on her leg has been present for about a year. She missed a dermatology appointment due to a new job. No current symptoms or changes are noted. Consider referral to dermatology for further evaluation if symptoms change or persist.    Return in about 3 months (around 09/24/2023) for weight.    Donato Schultz, DO

## 2023-06-26 ENCOUNTER — Telehealth: Payer: Self-pay | Admitting: Pharmacy Technician

## 2023-06-26 ENCOUNTER — Telehealth: Payer: Self-pay | Admitting: Family Medicine

## 2023-06-26 ENCOUNTER — Other Ambulatory Visit (HOSPITAL_COMMUNITY): Payer: Self-pay

## 2023-06-26 NOTE — Telephone Encounter (Signed)
 Pharmacy Patient Advocate Encounter   Received notification from CoverMyMeds that prior authorization for Zepbound 2.5MG /0.5ML pen-injectors is required/requested.   Insurance verification completed.   The patient is insured through [facility] [place] .   Per test claim: [facility]  SUBMITTED AND PENDING

## 2023-06-26 NOTE — Telephone Encounter (Signed)
 Pharmacy Patient Advocate Encounter  Received notification from [facility] Medicaid that Prior Authorization for Zepbound 2.5MG /0.5ML pen-injectors has been DENIED.  Full denial letter will be uploaded to the media tab. See denial reason below.     PA #/Case ID/Reference #: [id number]

## 2023-06-26 NOTE — Telephone Encounter (Signed)
 Copied from CRM [id number]. Topic: Clinical - Prescription Issue >> Jun 26, 2023 11:55 AM Denese Killings wrote: Reason for CRM: Natalia Leatherwood with [facility] wants to know if PCP has lab contracts or does patient has to be seen every so many months for ALPRAZolam (XANAX) 0.25 MG tablet

## 2023-06-26 NOTE — Telephone Encounter (Signed)
 Spoke with pharmacy. Advised of dx, last OV, and UDS

## 2023-06-27 LAB — DRUG MONITORING PANEL 376104, URINE
Amphetamines: NEGATIVE ng/mL (ref <500)
Barbiturates: NEGATIVE ng/mL (ref <300)
Benzodiazepines: NEGATIVE ng/mL (ref <100)
Cocaine Metabolite: NEGATIVE ng/mL (ref <150)
Desmethyltramadol: 884 ng/mL — ABNORMAL HIGH (ref <100)
Opiates: NEGATIVE ng/mL (ref <100)
Oxycodone: NEGATIVE ng/mL (ref <100)
Tramadol: 432 ng/mL — ABNORMAL HIGH (ref <100)

## 2023-06-27 LAB — DM TEMPLATE

## 2023-11-11 ENCOUNTER — Other Ambulatory Visit: Payer: Self-pay | Admitting: Family Medicine

## 2023-11-11 DIAGNOSIS — M1990 Unspecified osteoarthritis, unspecified site: Secondary | ICD-10-CM

## 2023-11-11 DIAGNOSIS — F419 Anxiety disorder, unspecified: Secondary | ICD-10-CM

## 2023-11-11 DIAGNOSIS — R002 Palpitations: Secondary | ICD-10-CM

## 2023-11-11 DIAGNOSIS — R000 Tachycardia, unspecified: Secondary | ICD-10-CM

## 2023-11-12 NOTE — Telephone Encounter (Signed)
 Requesting: Xanax  & Tramadol  Contract: 06/25/2023 UDS:    Last OV: 06/25/2023 Next OV: n/a Last Refill: 06/25/23, #90--0 RF & #60--0 RF Database:   Please advise

## 2024-01-28 ENCOUNTER — Ambulatory Visit: Admitting: Family Medicine

## 2024-01-28 ENCOUNTER — Encounter: Payer: Self-pay | Admitting: Family Medicine

## 2024-01-28 VITALS — BP 134/78 | HR 88 | Temp 97.6°F | Resp 18 | Ht 64.0 in | Wt 248.0 lb

## 2024-01-28 DIAGNOSIS — Z6841 Body Mass Index (BMI) 40.0 and over, adult: Secondary | ICD-10-CM

## 2024-01-28 DIAGNOSIS — E66813 Obesity, class 3: Secondary | ICD-10-CM | POA: Diagnosis not present

## 2024-01-28 DIAGNOSIS — M792 Neuralgia and neuritis, unspecified: Secondary | ICD-10-CM | POA: Diagnosis not present

## 2024-01-28 DIAGNOSIS — E782 Mixed hyperlipidemia: Secondary | ICD-10-CM

## 2024-01-28 DIAGNOSIS — E039 Hypothyroidism, unspecified: Secondary | ICD-10-CM

## 2024-01-28 MED ORDER — VALACYCLOVIR HCL 1 G PO TABS: 1000.0000 mg | ORAL_TABLET | Freq: Three times a day (TID) | ORAL | 0 refills | Status: AC

## 2024-01-28 NOTE — Progress Notes (Signed)
 Subjective:    Patient ID: Kelly [name], female    DOB: 12-29-1965, 58 y.o.   MRN: [id number]  Chief Complaint  Patient presents with   Leg Pain    Left leg  - burning sensation -- onset: 1 week     HPI Patient is in today for burning in leg.  Discussed the use of AI scribe software for clinical note transcription with the patient, who gave verbal consent to proceed.  History of Present Illness Kelly [name] is a 58 year old female who presents with a burning sensation in her right leg.  She has been experiencing a burning sensation in her right leg for approximately one week. The sensation starts in the calf and extends to the thigh, described as similar to 'razor burn' or 'sitting too close to the fire.' There is no associated rash or visible skin changes, but she notes occasional swelling in the area, which is not present every day.  She denies any recent back injury or muscle strain. She has not experienced any muscle spasms, although she occasionally gets cramps in her toes and feet, for which she takes magnesium.  Her past medical history includes receiving the shingles vaccine. She is currently using Zepbound  at a dose of 10 mg. She has previously taken sublingual vitamin B12 but has since stopped.   Past Medical History:  Diagnosis Date   Anxiety    Arthritis    B12 deficiency    Breast cyst    Depression with anxiety    Edema    Heart murmur    Herpes    History of hiatal hernia    repaur 2011    Hyperlipidemia    flax seed no statins    Hypothyroidism    Joint pain    Menopause    Morbid obesity (HCC) 2009   Ovarian cyst    PAC (premature atrial contraction)    Pedunculated colonic polyp 03/04/15   tubulovillous adenoma of transverse colon.    Pneumonia    hx of as a child    PONV (postoperative nausea and vomiting)    PVC (premature ventricular contraction)    Thyroid  goiter    Uterine fibroid    Vitamin D  deficiency     Past Surgical History:   Procedure Laterality Date   COLON SURGERY     lipoma removed from intestine    COLONOSCOPY     LAPAROSCOPIC GASTRIC BANDING  05-23-09   dr cephus with hiatal hernia repair.    LAPAROSCOPIC RIGHT HEMI COLECTOMY Right 03/25/2015   Procedure: LAPAROSCOPIC ASSISTED RIGHT HEMI COLECTOMY, with removal of lap band fluid prior to prepping.;  Surgeon: Alm Angle, MD;  Location: [facility];  Service: General;  Laterality: Right;   LIPOSUCTION  1995   POLYPECTOMY      Family History  Problem Relation Age of Onset   Hypertension Mother    Aneurysm Mother 51       brain   Hyperlipidemia Mother    Stroke Mother    Thyroid  disease Mother    Hypertension Father    Hyperlipidemia Father    Obesity Father    Colon polyps Father    Hypertension Maternal Grandmother    Hypertension Paternal Grandmother    Heart disease Paternal Grandmother 61       MI   Diabetes Paternal Aunt    Heart disease Maternal Grandfather        MI   Alzheimer's disease Paternal Grandfather  Colon cancer Neg Hx    Esophageal cancer Neg Hx    Rectal cancer Neg Hx    Stomach cancer Neg Hx     Social History   Socioeconomic History   Marital status: Single    Spouse name: Not on file   Number of children: 0   Years of education: Not on file   Highest education level: Not on file  Occupational History   Occupation: CMA    Comment: [employer]    Tobacco Use   Smoking status: Never   Smokeless tobacco: Never  Vaping Use   Vaping status: Never Used  Substance and Sexual Activity   Alcohol use: Yes    Alcohol/week: 3.0 standard drinks of alcohol    Types: 3 Glasses of wine per week    Comment: socially   Drug use: No   Sexual activity: Never  Other Topics Concern   Not on file  Social History Narrative   Not on file   Social Drivers of Health   Financial Resource Strain: Not on file  Food Insecurity: No Food Insecurity (02/07/2023)   Received from [facility]   Hunger Vital Sign    Within the past  12 months, you worried that your food would run out before you got the money to buy more.: Never true    Within the past 12 months, the food you bought just didn't last and you didn't have money to get more.: Never true  Transportation Needs: No Transportation Needs (02/07/2023)   Received from [facility] - Transportation    Lack of Transportation (Medical): No    Lack of Transportation (Non-Medical): No  Physical Activity: Inactive (05/31/2017)   Exercise Vital Sign    Days of Exercise per Week: 0 days    Minutes of Exercise per Session: 0 min  Stress: No Stress Concern Present (02/07/2023)   Received from [facility] - Occupational Stress Questionnaire    Feeling of Stress : Not at all  Social Connections: Unknown (11/16/2022)   Received [facility]   Social Network    Social Network: Not on file  Intimate Partner Violence: Not At Risk (02/07/2023)   Received from [facility]   HITS    Over the last 12 months how often did your partner physically hurt you?: Never    Over the last 12 months how often did your partner insult you or talk down to you?: Never    Over the last 12 months how often did your partner threaten you with physical harm?: Never    Over the last 12 months how often did your partner scream or curse at you?: Never    Outpatient Medications Prior to Visit  Medication Sig Dispense Refill   ALPRAZolam  (XANAX ) 0.25 MG tablet TAKE 1 TABLET BY MOUTH THREE TIMES DAILY AS NEEDED FOR ANXIETY 90 tablet 0   cetirizine  (ZYRTEC ) 10 MG tablet Take 1 tablet (10 mg total) by mouth daily. 30 tablet 11   Cholecalciferol (VITAMIN D ) 50 MCG (2000 UT) tablet Take 2,000 Units by mouth daily.     furosemide  (LASIX ) 40 MG tablet Take 1 tablet (40 mg total) by mouth daily. 90 tablet 3   rosuvastatin  (CRESTOR ) 10 MG tablet Take 1 tablet (10 mg total) by mouth daily. 90 tablet 3   tirzepatide  (ZEPBOUND ) 2.5 MG/0.5ML Pen Inject  2.5 mg into the skin once a week. 2 mL 0   traMADol  (ULTRAM )  50 MG tablet TAKE 1 TABLET BY MOUTH EVERY 6 HOURS AS NEEDED 60 tablet 0   valACYclovir  (VALTREX ) 1000 MG tablet Take 0.5 tablets (500 mg total) by mouth 3 (three) times daily. 30 tablet 3   zolpidem  (AMBIEN ) 5 MG tablet TAKE 1 TABLET BY MOUTH ONCE DAILY AT BEDTIME AS NEEDED FOR SLEEP 30 tablet 0   No facility-administered medications prior to visit.    No Known Allergies  Review of Systems  Constitutional:  Negative for fever and malaise/fatigue.  HENT:  Negative for congestion.   Eyes:  Negative for blurred vision.  Respiratory:  Negative for cough and shortness of breath.   Cardiovascular:  Negative for chest pain, palpitations and leg swelling.  Gastrointestinal:  Negative for vomiting.  Musculoskeletal:  Negative for back pain.  Skin:  Negative for itching and rash.  Neurological:  Positive for tingling and sensory change. Negative for loss of consciousness and headaches.       Objective:    Physical Exam Vitals and nursing note reviewed.  Constitutional:      General: She is not in acute distress.    Appearance: Normal appearance. She is well-developed.  HENT:     Head: Normocephalic and atraumatic.  Eyes:     General: No scleral icterus.       Right eye: No discharge.        Left eye: No discharge.  Cardiovascular:     Rate and Rhythm: Normal rate and regular rhythm.     Heart sounds: No murmur heard. Pulmonary:     Effort: Pulmonary effort is normal. No respiratory distress.     Breath sounds: Normal breath sounds.  Musculoskeletal:        General: Normal range of motion.     Cervical back: Normal range of motion and neck supple.     Right lower leg: No edema.     Left lower leg: No edema.  Skin:    General: Skin is warm and dry.     Findings: No lesion.  Neurological:     Mental Status: She is alert and oriented to person, place, and time.     Comments: Burning sensation L leg hip down back of leg  to ankle  No pain with palpation No rash   Psychiatric:        Mood and Affect: Mood normal.        Behavior: Behavior normal.        Thought Content: Thought content normal.        Judgment: Judgment normal.     BP 134/78   Pulse 88   Temp 97.6 F (36.4 C)   Resp 18   Ht 5' 4 (1.626 m)   Wt 248 lb (112.5 kg)   LMP 03/21/2015   SpO2 93%   BMI 42.57 kg/m  Wt Readings from Last 3 Encounters:  01/28/24 248 lb (112.5 kg)  06/25/23 282 lb (127.9 kg)  01/03/23 281 lb (127.5 kg)    Diabetic Foot Exam - Simple   No data filed    Lab Results  Component Value Date   WBC 8.7 01/03/2023   HGB 14.0 01/03/2023   HCT 43.0 01/03/2023   PLT 342.0 01/03/2023   GLUCOSE 79 01/03/2023   CHOL 126 01/03/2023   TRIG 85.0 01/03/2023   HDL 55.90 01/03/2023   LDLCALC 53 01/03/2023   ALT 8 01/03/2023   AST 12 01/03/2023   NA 139 01/03/2023   K 4.2 01/03/2023  CL 102 01/03/2023   CREATININE 0.93 01/03/2023   BUN 8 01/03/2023   CO2 28 01/03/2023   TSH 3.50 01/03/2023   HGBA1C 5.3 01/09/2018   MICROALBUR 0.7 12/24/2019    Lab Results  Component Value Date   TSH 3.50 01/03/2023   Lab Results  Component Value Date   WBC 8.7 01/03/2023   HGB 14.0 01/03/2023   HCT 43.0 01/03/2023   MCV 92.0 01/03/2023   PLT 342.0 01/03/2023   Lab Results  Component Value Date   NA 139 01/03/2023   K 4.2 01/03/2023   CO2 28 01/03/2023   GLUCOSE 79 01/03/2023   BUN 8 01/03/2023   CREATININE 0.93 01/03/2023   BILITOT 1.5 (H) 01/03/2023   ALKPHOS 100 01/03/2023   AST 12 01/03/2023   ALT 8 01/03/2023   PROT 7.3 01/03/2023   ALBUMIN 4.1 01/03/2023   CALCIUM  9.8 01/03/2023   ANIONGAP 8 03/26/2015   GFR 67.90 01/03/2023   Lab Results  Component Value Date   CHOL 126 01/03/2023   Lab Results  Component Value Date   HDL 55.90 01/03/2023   Lab Results  Component Value Date   LDLCALC 53 01/03/2023   Lab Results  Component Value Date   TRIG 85.0 01/03/2023   Lab Results   Component Value Date   CHOLHDL 2 01/03/2023   Lab Results  Component Value Date   HGBA1C 5.3 01/09/2018       Assessment & Plan:  Nerve pain -     valACYclovir  HCl; Take 1 tablet (1,000 mg total) by mouth 3 (three) times daily for 10 days.  Dispense: 30 tablet; Refill: 0 -     CBC with Differential/Platelet; Future -     Comprehensive metabolic panel with GFR; Future -     Vitamin B12; Future -     ANA; Future  Class 3 severe obesity due to excess calories with serious comorbidity and body mass index (BMI) of 40.0 to 44.9 in adult (HCC) -     CBC with Differential/Platelet; Future -     Comprehensive metabolic panel with GFR; Future -     Lipid panel; Future -     TSH; Future  Hypothyroidism, unspecified type -     TSH; Future  Mixed hyperlipidemia -     Comprehensive metabolic panel with GFR; Future -     Lipid panel; Future  Assessment and Plan Assessment & Plan Suspected shingles (herpes zoster) without rash   She experiences a burning sensation in the calf and thigh for about a week, without rash or blisters. Early shingles is suspected due to the burning sensation and absence of rash, despite vaccination. Nerve-related issues or vitamin B12 deficiency are considered but less likely. The condition is not severe and has slightly improved. Prescribe Valtrex  for 7-10 days. Order lab tests for vitamin B12 levels and thyroid  function.  Obesity, class 3   She is on Zepbound  10 mg for weight management, feels good, and is starting to lose weight.  Hypothyroidism   No specific symptoms or management discussed during this encounter. Order lab tests to check thyroid  function.    Torin Whisner R Lowne Chase, DO

## 2024-01-29 ENCOUNTER — Other Ambulatory Visit (INDEPENDENT_AMBULATORY_CARE_PROVIDER_SITE_OTHER)

## 2024-01-29 DIAGNOSIS — Z6841 Body Mass Index (BMI) 40.0 and over, adult: Secondary | ICD-10-CM

## 2024-01-29 DIAGNOSIS — M792 Neuralgia and neuritis, unspecified: Secondary | ICD-10-CM | POA: Diagnosis not present

## 2024-01-29 DIAGNOSIS — E039 Hypothyroidism, unspecified: Secondary | ICD-10-CM

## 2024-01-29 DIAGNOSIS — E66813 Obesity, class 3: Secondary | ICD-10-CM

## 2024-01-29 DIAGNOSIS — E782 Mixed hyperlipidemia: Secondary | ICD-10-CM | POA: Diagnosis not present

## 2024-01-29 LAB — CBC WITH DIFFERENTIAL/PLATELET
Basophils Absolute: 0.1 K/uL (ref 0.0–0.1)
Basophils Relative: 1.4 % (ref 0.0–3.0)
Eosinophils Absolute: 0.2 K/uL (ref 0.0–0.7)
Eosinophils Relative: 2.2 % (ref 0.0–5.0)
HCT: 42.1 % (ref 36.0–46.0)
Hemoglobin: 13.9 g/dL (ref 12.0–15.0)
Lymphocytes Relative: 36.6 % (ref 12.0–46.0)
Lymphs Abs: 2.6 K/uL (ref 0.7–4.0)
MCHC: 33 g/dL (ref 30.0–36.0)
MCV: 91.5 fl (ref 78.0–100.0)
Monocytes Absolute: 0.4 K/uL (ref 0.1–1.0)
Monocytes Relative: 5.7 % (ref 3.0–12.0)
Neutro Abs: 3.9 K/uL (ref 1.4–7.7)
Neutrophils Relative %: 54.1 % (ref 43.0–77.0)
Platelets: 309 K/uL (ref 150.0–400.0)
RBC: 4.6 Mil/uL (ref 3.87–5.11)
RDW: 13.6 % (ref 11.5–15.5)
WBC: 7.2 K/uL (ref 4.0–10.5)

## 2024-01-29 LAB — COMPREHENSIVE METABOLIC PANEL WITH GFR
ALT: 25 U/L (ref 0–35)
AST: 27 U/L (ref 0–37)
Albumin: 3.9 g/dL (ref 3.5–5.2)
Alkaline Phosphatase: 96 U/L (ref 39–117)
BUN: 11 mg/dL (ref 6–23)
CO2: 30 meq/L (ref 19–32)
Calcium: 9.3 mg/dL (ref 8.4–10.5)
Chloride: 101 meq/L (ref 96–112)
Creatinine, Ser: 0.96 mg/dL (ref 0.40–1.20)
GFR: 64.87 mL/min (ref >60.00)
Glucose, Bld: 84 mg/dL (ref 70–99)
Potassium: 3.9 meq/L (ref 3.5–5.1)
Sodium: 139 meq/L (ref 135–145)
Total Bilirubin: 1 mg/dL (ref 0.2–1.2)
Total Protein: 7.5 g/dL (ref 6.0–8.3)

## 2024-01-29 LAB — LIPID PANEL
Cholesterol: 173 mg/dL (ref 0–200)
HDL: 52.9 mg/dL (ref >39.00)
LDL Cholesterol: 102 mg/dL — ABNORMAL HIGH (ref 0–99)
NonHDL: 120.37
Total CHOL/HDL Ratio: 3
Triglycerides: 93 mg/dL (ref 0.0–149.0)
VLDL: 18.6 mg/dL (ref 0.0–40.0)

## 2024-01-30 LAB — VITAMIN B12: Vitamin B-12: 191 pg/mL — ABNORMAL LOW (ref 211–911)

## 2024-01-30 LAB — TSH: TSH: 5.01 u[IU]/mL (ref 0.35–5.50)

## 2024-01-30 LAB — ANA: Anti Nuclear Antibody (ANA): NEGATIVE

## 2024-02-09 ENCOUNTER — Ambulatory Visit: Payer: Self-pay | Admitting: Family Medicine

## 2024-02-27 ENCOUNTER — Ambulatory Visit: Admitting: Dermatology

## 2024-04-28 ENCOUNTER — Other Ambulatory Visit: Payer: Self-pay | Admitting: Family Medicine

## 2024-04-28 DIAGNOSIS — F419 Anxiety disorder, unspecified: Secondary | ICD-10-CM

## 2024-04-28 DIAGNOSIS — R000 Tachycardia, unspecified: Secondary | ICD-10-CM

## 2024-04-28 DIAGNOSIS — R002 Palpitations: Secondary | ICD-10-CM

## 2024-04-28 NOTE — Telephone Encounter (Signed)
 Requesting: alprazolam  0.25mg   Contract: 06/25/23 UDS: 06/25/23 Last Visit: 01/28/24 Next Visit: None Last Refill: 11/12/23 #90 and 0RF   Please Advise
